# Patient Record
Sex: Female | Born: 1965 | Race: White | Hispanic: No | Marital: Married | Smoking: Never smoker
Health system: Southern US, Community
[De-identification: ages and names within clinical notes are randomized; demographics above are authoritative.]

## PROBLEM LIST (undated history)

## (undated) DIAGNOSIS — I6529 Occlusion and stenosis of unspecified carotid artery: Secondary | ICD-10-CM

## (undated) DIAGNOSIS — M549 Dorsalgia, unspecified: Secondary | ICD-10-CM

## (undated) DIAGNOSIS — G8929 Other chronic pain: Secondary | ICD-10-CM

## (undated) DIAGNOSIS — N189 Chronic kidney disease, unspecified: Secondary | ICD-10-CM

## (undated) DIAGNOSIS — I773 Arterial fibromuscular dysplasia: Secondary | ICD-10-CM

## (undated) DIAGNOSIS — I639 Cerebral infarction, unspecified: Secondary | ICD-10-CM

## (undated) DIAGNOSIS — G4730 Sleep apnea, unspecified: Secondary | ICD-10-CM

## (undated) HISTORY — DX: Sleep apnea, unspecified: G47.30

## (undated) HISTORY — PX: NASAL SEPTUM SURGERY: SHX37

## (undated) HISTORY — DX: Chronic kidney disease, unspecified: N18.9

## (undated) HISTORY — DX: Cerebral infarction, unspecified: I63.9

## (undated) HISTORY — DX: Occlusion and stenosis of unspecified carotid artery: I65.29

## (undated) HISTORY — DX: Other chronic pain: G89.29

## (undated) HISTORY — DX: Arterial fibromuscular dysplasia: I77.3

## (undated) HISTORY — DX: Dorsalgia, unspecified: M54.9

---

## 1998-08-17 ENCOUNTER — Encounter: Payer: Self-pay | Admitting: Obstetrics and Gynecology

## 1998-08-17 ENCOUNTER — Ambulatory Visit (HOSPITAL_COMMUNITY): Admission: AD | Admit: 1998-08-17 | Discharge: 1998-08-17 | Payer: Self-pay | Admitting: Obstetrics and Gynecology

## 1998-11-18 ENCOUNTER — Ambulatory Visit (HOSPITAL_COMMUNITY): Admission: RE | Admit: 1998-11-18 | Discharge: 1998-11-18 | Payer: Self-pay | Admitting: Obstetrics and Gynecology

## 1998-11-18 ENCOUNTER — Encounter (INDEPENDENT_AMBULATORY_CARE_PROVIDER_SITE_OTHER): Payer: Self-pay

## 1999-09-18 ENCOUNTER — Other Ambulatory Visit: Admission: RE | Admit: 1999-09-18 | Discharge: 1999-09-18 | Payer: Self-pay | Admitting: Obstetrics and Gynecology

## 1999-11-03 ENCOUNTER — Encounter: Payer: Self-pay | Admitting: Obstetrics and Gynecology

## 1999-11-03 ENCOUNTER — Ambulatory Visit (HOSPITAL_COMMUNITY): Admission: RE | Admit: 1999-11-03 | Discharge: 1999-11-03 | Payer: Self-pay | Admitting: Obstetrics and Gynecology

## 2000-03-18 ENCOUNTER — Encounter (HOSPITAL_COMMUNITY): Admission: RE | Admit: 2000-03-18 | Discharge: 2000-03-26 | Payer: Self-pay | Admitting: *Deleted

## 2000-03-25 ENCOUNTER — Inpatient Hospital Stay (HOSPITAL_COMMUNITY): Admission: EM | Admit: 2000-03-25 | Discharge: 2000-03-27 | Payer: Self-pay | Admitting: Obstetrics and Gynecology

## 2001-05-26 ENCOUNTER — Other Ambulatory Visit: Admission: RE | Admit: 2001-05-26 | Discharge: 2001-05-26 | Payer: Self-pay | Admitting: Obstetrics and Gynecology

## 2001-10-24 ENCOUNTER — Encounter: Admission: RE | Admit: 2001-10-24 | Discharge: 2001-10-24 | Payer: Self-pay | Admitting: *Deleted

## 2001-10-24 ENCOUNTER — Encounter: Payer: Self-pay | Admitting: *Deleted

## 2001-11-08 ENCOUNTER — Encounter: Payer: Self-pay | Admitting: *Deleted

## 2001-11-08 ENCOUNTER — Encounter: Admission: RE | Admit: 2001-11-08 | Discharge: 2001-11-08 | Payer: Self-pay | Admitting: *Deleted

## 2002-05-30 ENCOUNTER — Other Ambulatory Visit: Admission: RE | Admit: 2002-05-30 | Discharge: 2002-05-30 | Payer: Self-pay | Admitting: Obstetrics and Gynecology

## 2004-09-18 ENCOUNTER — Other Ambulatory Visit: Admission: RE | Admit: 2004-09-18 | Discharge: 2004-09-18 | Payer: Self-pay | Admitting: Obstetrics and Gynecology

## 2007-08-09 ENCOUNTER — Ambulatory Visit (HOSPITAL_COMMUNITY): Admission: RE | Admit: 2007-08-09 | Discharge: 2007-08-09 | Payer: Self-pay | Admitting: Obstetrics and Gynecology

## 2010-08-22 NOTE — Discharge Summary (Signed)
[facility]  Patient:    Rachel Golden, Rachel Golden                          MRN: [id number] Adm. Date:  [id number] Disc. Date: [id number] Attending:  Michaele Offer                           Discharge Summary  DISCHARGE DIAGNOSES:          1. Term pregnancy at 39 weeks, delivered.                               2. Advanced maternal age.                               3. Status post normal spontaneous vaginal                                  delivery.  DISCHARGE MEDICATIONS:        Motrin 600 mg p.o. q.6h. p.r.n.  DISCHARGE FOLLOW-UP:          Patient is to follow up in six weeks for her routine postpartum examination.  HOSPITAL COURSE:              Patient is a 45 year old G16, P1-0-2-1 who was admitted at 46 weeks for induction given advanced maternal age and a favorable cervix.  Prenatal care had been complicated only by a borderline elevated maternal serum AFP and anemia.  Patient did have AMA, however, declined amniocentesis and had a normal ultrasound.  Patient had a history of two first trimester spontaneous miscarriages and was on baby aspirin and progestin suppositories in the first trimester.  PRENATAL LABORATORIES:        Blood type A+.  RPR nonreactive.  Rubella immune.  Hepatitis B surface antigen negative.  HIV negative.  GC negative. Chlamydia negative.  GBS negative.  PAST OBSTETRICAL HISTORY:     In 1998 patient had a vacuum and forceps assisted delivery at 41 weeks of a 7 pound 10 ounce infant.  In May 2000 she had an incomplete miscarriage with a D&C.  In August 2000 she had a balloted ovum with a D&C.  PAST SURGICAL HISTORY:        She had nasal reconstruction, D&C x 2.  PAST MEDICAL HISTORY:         None.  ALLERGIES:                    No known drug allergies.  SOCIAL HISTORY:               She is married and smokes no tobacco.  PHYSICAL EXAMINATION:  VITAL SIGNS:                  She is afebrile with stable vital signs  on admission.  Fetal heart rate was reactive.  ABDOMEN:                      Gravid, nontender.  EFW approximately 7 pounds.  PELVIC:                       Vagina:  Patient was 3 cm, 30%, -  1 station.                                She had assist of ruptured membranes with clear fluid obtained and was begun on Pitocin augmentation.  Patient progressed quickly to complete and pushed well with a normal spontaneous vaginal delivery of a vigorous female infant.  Apgars were 8 and 9.  Weight was 8 pounds even. She had a second degree midline episiotomy.  Placenta delivered spontaneous and intact.  A small laceration on the cervix was also repaired for hemostasis with 3-0 Vicryl.  She did well postpartum and on postpartum day #2 was afebrile with stable vital signs, normal lochia, and her pain was well controlled.  Therefore, she was discharged to home to follow up in the office as previously stated. DD:  03/27/00 TD:  03/28/00 Job: [id number]

## 2012-05-17 ENCOUNTER — Ambulatory Visit (INDEPENDENT_AMBULATORY_CARE_PROVIDER_SITE_OTHER): Payer: Managed Care, Other (non HMO) | Admitting: Family Medicine

## 2012-05-17 VITALS — BP 108/60 | Ht 60.0 in | Wt 115.0 lb

## 2012-05-17 DIAGNOSIS — M999 Biomechanical lesion, unspecified: Secondary | ICD-10-CM

## 2012-05-17 DIAGNOSIS — M2170 Unequal limb length (acquired), unspecified site: Secondary | ICD-10-CM

## 2012-05-17 DIAGNOSIS — M545 Low back pain, unspecified: Secondary | ICD-10-CM

## 2012-05-17 NOTE — Progress Notes (Signed)
Chief complaint: Low back pain  History of present illness: Patient is a 47 year old female who is coming in with a complaint of low back pain. 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 Patient though has recently started running and has been trying to do more exercising and is noticing increasing pain. Patient states that this is on the lumbar side mostly right-sided, denies any radiation in the legs, describes the pain more as a dull aching sensation with some time sharp pain. Patient states that this pain is not waking her up at night but does interfere with some of her activities such as working out. Patient denies any weakness in her legs or any numbness. Patient was told about osteopathic manipulation in with like to see if she could be evaluated for this.  Past medical surgical family and social history reviewed without any significant pathology related to the history of present illness.  Denies fever, chills, nausea vomiting abdominal pain, dysuria, chest pain, shortness of breath dyspnea on exertion or numbness in extremities  Physical exam Blood pressure 108/60, height 5' (1.524 m), weight 115 lb (52.164 kg). General: No apparent distress alert and oriented x3 mood and affect normal. Respiratory: Patient's speak in full sentences and does not appear short of breath Skin: Warm dry intact with no signs of infection or rash Neuro: Cranial nerves II through XII are intact, neurovascularly intact in all extremities with 2+ DTRs and 2+ pulses. Back exam: On inspection patient has some minimal scoliosis to the right the lumbar spine. Patient is nontender on exam except for mild over the right SI joint. Patient has a negative straight leg and negative Faber test bilaterally. She is neurovascularly intact distally with 5 out of 5  strength and deep tendon reflexes are symmetric. Patient for range of motion is full. Patient though does have a significant leg length discrepancy with the right leg being shorter than the left leg by approximately half an inch.  Osteopathic manipulation findings Cervical C3 flexed rotated and side bent right Thoracic T5 extended rotated and side bent left Lumbar: L2 flexed rotated and side bent right L5 flexed rotated and side bent left Sacrum Left on left pain over the right SI joint.

## 2012-05-17 NOTE — Assessment & Plan Note (Signed)
Patient was given a 3/16 he'll lift into the right shoe. Patient will wear this and for the next 2 weeks and then increase size to 5/16. Patient was given an extra pair to put in her tennis shoes. Patient will followup in 3-6 weeks for further evaluation.

## 2012-05-17 NOTE — Assessment & Plan Note (Signed)
Decision today to treat with OMT was based on Physical Exam  After verbal consent patient was treated with ME, Articular techniques in lumbar sacral areas  Patient tolerated the procedure well with improvement in symptoms  Patient given exercises, stretches and lifestyle modifications  See medications in patient instructions if given  Patient will follow up in 3-6 weeks

## 2012-05-17 NOTE — Assessment & Plan Note (Signed)
Does have lumbar pain. Patient told about anti-inflammatories and given home exercise program to strengthen core as well as increase range of motion of the hip and back. Patient did have osteopathic manipulation which did have significant improvement. Leg length discrepancy also likely causing pain. Patient will followup again in 3-6 weeks for further evaluation and manipulation.

## 2012-06-07 ENCOUNTER — Ambulatory Visit: Payer: Managed Care, Other (non HMO) | Admitting: Family Medicine

## 2012-06-28 ENCOUNTER — Ambulatory Visit (INDEPENDENT_AMBULATORY_CARE_PROVIDER_SITE_OTHER): Payer: Managed Care, Other (non HMO) | Admitting: Family Medicine

## 2012-06-28 VITALS — BP 106/60 | Ht 60.0 in | Wt 115.0 lb

## 2012-06-28 DIAGNOSIS — M999 Biomechanical lesion, unspecified: Secondary | ICD-10-CM

## 2012-06-28 NOTE — Assessment & Plan Note (Signed)
Decision today to treat with OMT was based on Physical Exam  After verbal consent patient was treated with HVLA, ME techniques in cervical, thoracic lumbar and sacral areas  Patient tolerated the procedure well with improvement in symptoms  Patient given exercises, stretches and lifestyle modifications  See medications in patient instructions if given  Patient will follow up in 8-10 weeks      

## 2012-06-28 NOTE — Progress Notes (Signed)
Chief complaint followup of low back pain and leg length discrepancy  History of present illness: Patient is a 47 year old female who was seen 6 weeks ago for low back pain. Patient did have osteopathic manipulation and was found to have a leg length discrepancy. Patient continues to wear a lift on the right shoe daily which does seem to significantly help. Patient has also been working on core strengthening and she's been doing well. Patient has started playing tennis again and does have some aches and pains but overall she states she is approximately 75% better. Patient denies any radiation down her legs, denies any numbness or weakness. Patient is here for manipulation therapy again.  Osteopathic manipulation findings  Cervical  C3 flexed rotated and side bent right  Thoracic  T5 extended rotated and side bent left  Lumbar:  L2 flexed rotated and side bent right  L5 flexed rotated and side bent left  Sacrum  Left on left pain over the right SI joint.

## 2012-08-30 ENCOUNTER — Ambulatory Visit: Payer: Managed Care, Other (non HMO) | Admitting: Family Medicine

## 2012-12-13 ENCOUNTER — Ambulatory Visit (INDEPENDENT_AMBULATORY_CARE_PROVIDER_SITE_OTHER): Payer: Managed Care, Other (non HMO) | Admitting: Family Medicine

## 2012-12-13 VITALS — BP 102/80 | HR 68 | Resp 12 | Ht 60.0 in | Wt 114.0 lb

## 2012-12-13 DIAGNOSIS — M545 Low back pain: Secondary | ICD-10-CM

## 2012-12-13 DIAGNOSIS — M2170 Unequal limb length (acquired), unspecified site: Secondary | ICD-10-CM

## 2012-12-13 DIAGNOSIS — M999 Biomechanical lesion, unspecified: Secondary | ICD-10-CM

## 2012-12-13 NOTE — Assessment & Plan Note (Signed)
Decision today to treat with OMT was based on Physical Exam  After verbal consent patient was treated with HVLA, ME techniques in cervical, thoracic lumbar and sacral areas  Patient tolerated the procedure well with improvement in symptoms  Patient given exercises, stretches and lifestyle modifications  See medications in patient instructions if given  Patient will follow up in 6 weeks    

## 2012-12-13 NOTE — Assessment & Plan Note (Signed)
Discussed the patient does need to wear heel lift in all shoes.

## 2012-12-13 NOTE — Progress Notes (Signed)
  Subjective:    CC: Low back pain  HPI: Patient is a very pleasant 47 year old established patient of mine coming in for manipulation therapy. Patient has not been seen for 6 months. Patient had been doing very well and has been doing her exercises on a regular basis but recently she has had an exacerbation. Patient recently moved to a new house and has been doing a lot of manual labor moving boxes. Patient states that she has had more worsening left low back pain. Patient denies any radiation down the legs, denies any numbness or tingling in the toes and denies any weakness. Patient can use to work out on a fairly regular basis and has recently done her own sprint triathlon. Patient also has not been wearing her heel lift on her regular basis. She knows that this is likely exacerbated the problem as well.   Past medical history, Surgical history, Family history not pertinant except as noted below, Social history, Allergies, and medications have been entered into the medical record, reviewed, and no changes needed.   Review of Systems: No fevers, chills, night sweats, weight loss, chest pain, or shortness of breath. Patient also denies any abdominal pain, any diarrhea or constipation, any nausea or vomiting.  Objective:   Blood pressure 102/80, pulse 68, resp. rate 12, height 5' (1.524 m), weight 114 lb (51.71 kg), SpO2 99.00%.  General: Well Developed, well nourished, and in no acute distress.  Neuro: Alert and oriented x3, extra-ocular muscles intact, sensation grossly intact.  HEENT: Normocephalic, atraumatic, pupils equal round reactive to light, neck supple, no masses, no lymphadenopathy, thyroid nonpalpable.  Skin: Warm and dry, no rashes. Cardiac:  no lower extremity edema. Respiratory: Not using accessory muscles, speaking in full sentences. Abdominal: NT, soft Gait: Nonantlagic, good balance and coordination, patient does have significant shortening of the right leg compared to the  contralateral side. Lymphatic: no lymphadenopathy in neck or axillae on palpation, non tender.  Musculoskeletal: Inspection and palpation of the right and left upper extremities including the shoulders elbows and wrist are unremarkable with full range of motion and good muscle strength and tone. Inspection and palpation of the right and left lower extremities including the hips knees and ankles are unremarkable and nontender with full range of motion and good muscle strength and tone and are symmetric.  Osteopathic manipulation findings  Cervical  Neutral  Thoracic  T5 extended rotated and side bent left  Lumbar:  L2 flexed rotated and side bent right  L5 flexed rotated and side bent left  Sacrum  Left on left pain over the right SI joint  Impression and Recommendations:

## 2012-12-13 NOTE — Patient Instructions (Signed)
Very nice to see you Can't wait to meet your daughter Let add in the hip flexor after running or exercise.  Lets have you come back in 6 weeks.

## 2012-12-13 NOTE — Assessment & Plan Note (Signed)
Low back pain secondary to patient's leg length discrepancy and likely overuse secondary to tight hip flexors. Patient given you home exercise program that could be beneficial and did respond very well to osteopathic manipulation. Discussed icing protocol Patient will come back again in 6 weeks for further evaluation.

## 2013-01-24 ENCOUNTER — Ambulatory Visit: Payer: Managed Care, Other (non HMO) | Admitting: Family Medicine

## 2013-08-30 ENCOUNTER — Ambulatory Visit: Payer: Managed Care, Other (non HMO) | Admitting: Family Medicine

## 2013-09-06 ENCOUNTER — Ambulatory Visit (INDEPENDENT_AMBULATORY_CARE_PROVIDER_SITE_OTHER): Payer: Managed Care, Other (non HMO) | Admitting: Family Medicine

## 2013-09-06 VITALS — BP 96/70 | HR 60 | Ht 60.0 in | Wt 109.0 lb

## 2013-09-06 DIAGNOSIS — M6240 Contracture of muscle, unspecified site: Secondary | ICD-10-CM

## 2013-09-06 DIAGNOSIS — M545 Low back pain, unspecified: Secondary | ICD-10-CM

## 2013-09-06 DIAGNOSIS — M999 Biomechanical lesion, unspecified: Secondary | ICD-10-CM

## 2013-09-06 DIAGNOSIS — M24559 Contracture, unspecified hip: Secondary | ICD-10-CM

## 2013-09-06 DIAGNOSIS — M9981 Other biomechanical lesions of cervical region: Secondary | ICD-10-CM

## 2013-09-06 MED ORDER — CYCLOBENZAPRINE HCL 10 MG PO TABS: 10.0000 mg | ORAL_TABLET | Freq: Three times a day (TID) | ORAL | Status: DC | PRN

## 2013-09-06 NOTE — Assessment & Plan Note (Signed)
Patient's hip flexors are severely tight at this time and may even have a strain. Patient was given a handout of home exercises and showed proper technique today. We discussed icing protocol and over-the-counter medications that could be beneficial. Patient was also given a prescription for muscle relaxant. Patient will come back again in 4 weeks for further evaluation and treatment.  Spent greater than 25 minutes with patient face-to-face and had greater than 50% of counseling including as described above in assessment and plan.

## 2013-09-06 NOTE — Assessment & Plan Note (Signed)
Decision today to treat with OMT was based on Physical Exam  After verbal consent patient was treated with HVLA, ME techniques in cervical, thoracic lumbar and sacral areas  Patient tolerated the procedure well with improvement in symptoms  Patient given exercises, stretches and lifestyle modifications  See medications in patient instructions if given  Patient will follow up in 4 weeks      

## 2013-09-06 NOTE — Progress Notes (Signed)
  Subjective:    CC: Low back pain followup  HPI: Patient is a very pleasant 48 year old established patient of mine coming in for manipulation therapy. Patient has not been seen for 9 months. Patient has been doing very well but has increased her running mileage. Patient did do a half marathon and then afterwards her having her low back pain. This is more of a dull aching sensation that is worse with activity. Denies any radiation down the legs any numbness or tingling or any weakness. Denies any nighttime awakening. Patient has also been unfortunately moving a lot of boxes with her father going to a assisted-living facility. Patient states this has exacerbated the problem to  Past medical history, Surgical history, Family history not pertinant except as noted below, Social history, Allergies, and medications have been entered into the medical record, reviewed, and no changes needed.   Review of Systems: No fevers, chills, night sweats, weight loss, chest pain, or shortness of breath. Patient also denies any abdominal pain, any diarrhea or constipation, any nausea or vomiting.  Objective:   Blood pressure 96/70, pulse 60, height 5' (1.524 m), weight 109 lb (49.442 kg), SpO2 98.00%.  General: Well Developed, well nourished, and in no acute distress.  Neuro: Alert and oriented x3, extra-ocular muscles intact, sensation grossly intact.  HEENT: Normocephalic, atraumatic, pupils equal round reactive to light, neck supple, no masses, no lymphadenopathy, thyroid nonpalpable.  Skin: Warm and dry, no rashes. Cardiac:  no lower extremity edema. Respiratory: Not using accessory muscles, speaking in full sentences. Abdominal: NT, soft Gait: Nonantlagic, good balance and coordination, patient does have significant shortening of the right leg compared to the contralateral side. Lymphatic: no lymphadenopathy in neck or axillae on palpation, non tender.  Musculoskeletal: Inspection and palpation of the right  and left upper extremities including the shoulders elbows and wrist are unremarkable with full range of motion and good muscle strength and tone. Inspection and palpation of the right and left lower extremities including the hips knees and ankles are unremarkable and nontender with full range of motion and good muscle strength and tone and are symmetric.  Osteopathic manipulation findings  Cervical  C2 flexed rotated and side bent right Thoracic  T5 extended rotated and side bent left  Lumbar:  L2 flexed rotated and side bent right  L5 flexed rotated and side bent left  Severely tight hip flexors bilaterally Sacrum  Left on left pain over the right SI joint  Impression and Recommendations:

## 2013-09-06 NOTE — Assessment & Plan Note (Signed)
Encourage patient to continue to wear her heel lift on the

## 2013-09-06 NOTE — Patient Instructions (Signed)
Good to see you! Flexeril at night and when you need it during the day.  Try stretches for hip flexor.  One knee down, one up tilt pelvis forward. Hold 10 seconds and repeat.  Look at handout.  Continue the turmeric See me again in 4 weeks if not perfect.

## 2013-10-17 ENCOUNTER — Other Ambulatory Visit: Payer: Self-pay | Admitting: Obstetrics and Gynecology

## 2013-10-17 DIAGNOSIS — R928 Other abnormal and inconclusive findings on diagnostic imaging of breast: Secondary | ICD-10-CM

## 2013-10-25 ENCOUNTER — Ambulatory Visit
Admission: RE | Admit: 2013-10-25 | Discharge: 2013-10-25 | Disposition: A | Discharge status: Home / Self Care | Payer: Managed Care, Other (non HMO) | Source: Ambulatory Visit | Attending: Obstetrics and Gynecology | Admitting: Obstetrics and Gynecology

## 2013-10-25 DIAGNOSIS — R928 Other abnormal and inconclusive findings on diagnostic imaging of breast: Secondary | ICD-10-CM

## 2014-05-01 ENCOUNTER — Encounter: Payer: Self-pay | Admitting: Family Medicine

## 2014-05-01 ENCOUNTER — Ambulatory Visit (INDEPENDENT_AMBULATORY_CARE_PROVIDER_SITE_OTHER): Payer: Managed Care, Other (non HMO) | Admitting: Family Medicine

## 2014-05-01 VITALS — BP 98/66 | HR 69 | Ht 60.0 in | Wt 119.0 lb

## 2014-05-01 DIAGNOSIS — M999 Biomechanical lesion, unspecified: Secondary | ICD-10-CM

## 2014-05-01 DIAGNOSIS — M9904 Segmental and somatic dysfunction of sacral region: Secondary | ICD-10-CM

## 2014-05-01 DIAGNOSIS — M545 Low back pain, unspecified: Secondary | ICD-10-CM

## 2014-05-01 DIAGNOSIS — M9903 Segmental and somatic dysfunction of lumbar region: Secondary | ICD-10-CM

## 2014-05-01 DIAGNOSIS — M9902 Segmental and somatic dysfunction of thoracic region: Secondary | ICD-10-CM

## 2014-05-01 NOTE — Patient Instructions (Signed)
Good to see you! Exercises 3 times a week.  Biking would be good.  OK to do arc or elliptical in 1 week.  Ice 20 minutes 2 times daily. Usually after activity and before bed. See me again in 4 weeks at that time if doing well will let you start running without restrictions.  Running only 2 times a week.

## 2014-05-01 NOTE — Assessment & Plan Note (Signed)
Patient's low back pain is multifactorial secondary to patient's hip flexor tightness as well as leg length discrepancy. Encourage patient to wear the heel lift on a regular basis. We also discussed icing regimen and patient was given topical anti-inflammatories on a 2 day burst of anti-inflammatories. We discussed what activities in the gym would be more beneficial. Patient will limit the amount of running she does to 2 times a week for the next 4 weeks and then come back for further evaluation. Patient did respond very well to osteopathic manipulation today.

## 2014-05-01 NOTE — Progress Notes (Signed)
  Subjective:    CC: Low back pain followup  HPI: Patient is a very pleasant 49 year old established patient of mine coming in for manipulation therapy. Patient has not been seen for 7 months. Patient has decreased the amount of running recently. States that for the last month is a some decrease inflammation. Denies any radiation of pain any numbness. Patient states though that it is starting to affect some of her activities. Patient has not been working out secondary to this pain. Denies any fevers or chills or any abnormal weight loss. Patient has had some mild weight gain recently. Patient was a severity of pain is 6 out of 10 and not significantly different than her other pain. Patient has been going to the gym fairly regularly 2-3 times a week. Denies any nighttime awakening. States that ice and Tylenol as helpful.  Past medical history, Surgical history, Family history not pertinant except as noted below, Social history, Allergies, and medications have been entered into the medical record, reviewed, and no changes needed.   Review of Systems: No fevers, chills, night sweats, weight loss, chest pain, or shortness of breath. Patient also denies any abdominal pain, any diarrhea or constipation, any nausea or vomiting.  Objective:   Blood pressure 98/66, pulse 69, height 5' (1.524 m), weight 119 lb (53.978 kg), SpO2 99 %.  General: Well Developed, well nourished, and in no acute distress.  Neuro: Alert and oriented x3, extra-ocular muscles intact, sensation grossly intact.  HEENT: Normocephalic, atraumatic, pupils equal round reactive to light, neck supple, no masses, no lymphadenopathy, thyroid nonpalpable.  Skin: Warm and dry, no rashes. Cardiac:  no lower extremity edema. Respiratory: Not using accessory muscles, speaking in full sentences. Abdominal: NT, soft Gait: Nonantlagic, good balance and coordination, patient does have significant shortening of the right leg compared to the  contralateral side. Lymphatic: no lymphadenopathy in neck or axillae on palpation, non tender.  Musculoskeletal: Inspection and palpation of the right and left upper extremities including the shoulders elbows and wrist are unremarkable with full range of motion and good muscle strength and tone. Inspection and palpation of the right and left lower extremities including the hips knees and ankles are unremarkable and nontender with full range of motion and good muscle strength and tone and are symmetric. Back Exam:  Inspection: Unremarkable  Motion: Flexion 45 deg, Extension 45 deg, Side Bending to 45 deg bilaterally,  Rotation to 45 deg bilaterally  SLR laying: Negative  XSLR laying: Negative  Palpable tenderness: Tenderness over the left SI joint FABER: negative. Sensory change: Gross sensation intact to all lumbar and sacral dermatomes.  Reflexes: 2+ at both patellar tendons, 2+ at achilles tendons, Babinski's downgoing.  Strength at foot  Plantar-flexion: 5/5 Dorsi-flexion: 5/5 Eversion: 5/5 Inversion: 5/5  Leg strength  Quad: 5/5 Hamstring: 5/5 Hip flexor: 5/5 Hip abductors: 5/5  Gait unremarkable.   Osteopathic manipulation findings  Cervical  C2 flexed rotated and side bent right Thoracic  T5 extended rotated and side bent left  Lumbar:  L2 flexed rotated and side bent right  L5 flexed rotated and side bent left  Severely tight hip flexors bilaterally Sacrum  Left on left pain over the LEFT SI joint  Impression and Recommendations:

## 2014-05-01 NOTE — Assessment & Plan Note (Signed)
Decision today to treat with OMT was based on Physical Exam  After verbal consent patient was treated with HVLA, ME techniques in cervical, thoracic lumbar and sacral areas  Patient tolerated the procedure well with improvement in symptoms  Patient given exercises, stretches and lifestyle modifications  See medications in patient instructions if given  Patient will follow up in 4 weeks      

## 2014-05-29 ENCOUNTER — Encounter: Payer: Self-pay | Admitting: Family Medicine

## 2014-05-29 ENCOUNTER — Ambulatory Visit (INDEPENDENT_AMBULATORY_CARE_PROVIDER_SITE_OTHER): Payer: Managed Care, Other (non HMO) | Admitting: Family Medicine

## 2014-05-29 VITALS — BP 108/72 | HR 70 | Wt 117.0 lb

## 2014-05-29 DIAGNOSIS — M999 Biomechanical lesion, unspecified: Secondary | ICD-10-CM

## 2014-05-29 DIAGNOSIS — M9904 Segmental and somatic dysfunction of sacral region: Secondary | ICD-10-CM

## 2014-05-29 DIAGNOSIS — M9902 Segmental and somatic dysfunction of thoracic region: Secondary | ICD-10-CM

## 2014-05-29 DIAGNOSIS — M545 Low back pain, unspecified: Secondary | ICD-10-CM

## 2014-05-29 DIAGNOSIS — M9903 Segmental and somatic dysfunction of lumbar region: Secondary | ICD-10-CM

## 2014-05-29 MED ORDER — IBUPROFEN-FAMOTIDINE 800-26.6 MG PO TABS: 1.0000 | ORAL_TABLET | Freq: Three times a day (TID) | ORAL | Status: DC

## 2014-05-29 NOTE — Assessment & Plan Note (Signed)
Decision today to treat with OMT was based on Physical Exam  After verbal consent patient was treated with HVLA, ME techniques in cervical, thoracic lumbar and sacral areas  Patient tolerated the procedure well with improvement in symptoms  Patient given exercises, stretches and lifestyle modifications  See medications in patient instructions if given  Patient will follow up in 4-6 weeks      

## 2014-05-29 NOTE — Progress Notes (Signed)
  Subjective:    CC: Low back pain followup  HPI: Patient is a very pleasant 49 year old established patient of mine coming in for manipulation therapy. Patient states that after last visit she is doing approximately 50% better. Patient has been doing more the recumbent bike and is feeling better. Patient states that the medication that I gave her is also been very helpful. Patient has been doing the icing protocol and did get some the vitamins. Patient still has some mild dull aching pain but is doing somewhat better. Denies any new symptoms.  Past medical history, Surgical history, Family history not pertinant except as noted below, Social history, Allergies, and medications have been entered into the medical record, reviewed, and no changes needed.   Review of Systems: No fevers, chills, night sweats, weight loss, chest pain, or shortness of breath. Patient also denies any abdominal pain, any diarrhea or constipation, any nausea or vomiting.  Objective:   Blood pressure 108/72, pulse 70, weight 117 lb (53.071 kg), SpO2 96 %.  General: Well Developed, well nourished, and in no acute distress.  Neuro: Alert and oriented x3, extra-ocular muscles intact, sensation grossly intact.  HEENT: Normocephalic, atraumatic, pupils equal round reactive to light, neck supple, no masses, no lymphadenopathy, thyroid nonpalpable.  Skin: Warm and dry, no rashes. Cardiac:  no lower extremity edema. Respiratory: Not using accessory muscles, speaking in full sentences. Abdominal: NT, soft Gait: Nonantlagic, good balance and coordination, patient does have significant shortening of the right leg compared to the contralateral side. Lymphatic: no lymphadenopathy in neck or axillae on palpation, non tender.  Musculoskeletal: Inspection and palpation of the right and left upper extremities including the shoulders elbows and wrist are unremarkable with full range of motion and good muscle strength and tone. Inspection  and palpation of the right and left lower extremities including the hips knees and ankles are unremarkable and nontender with full range of motion and good muscle strength and tone and are symmetric. Back Exam:  Inspection: Unremarkable  Motion: Flexion 45 deg, Extension 45 deg, Side Bending to 45 deg bilaterally,  Rotation to 45 deg bilaterally  SLR laying: Negative  XSLR laying: Negative  Palpable tenderness: Tenderness over the left SI joint still present but less FABER: negative. Sensory change: Gross sensation intact to all lumbar and sacral dermatomes.  Reflexes: 2+ at both patellar tendons, 2+ at achilles tendons, Babinski's downgoing.  Strength at foot  Plantar-flexion: 5/5 Dorsi-flexion: 5/5 Eversion: 5/5 Inversion: 5/5  Leg strength  Quad: 5/5 Hamstring: 5/5 Hip flexor: 5/5 Hip abductors: 5/5  Gait unremarkable.   Osteopathic manipulation findings  Cervical  C2 flexed rotated and side bent right Thoracic  T5 extended rotated and side bent left   Lumbar:  L2 flexed rotated and side bent right  L5 flexed rotated and side bent left   Sacrum  Left on left   Impression and Recommendations:

## 2014-05-29 NOTE — Patient Instructions (Addendum)
Good to see you [place]  Continue what you are doing you are doing well.  Ice when acting up See me again in 5 weeks.

## 2014-05-29 NOTE — Assessment & Plan Note (Signed)
Patient back pain seems to be mostly multifactorial. Patient is doing somewhat better at this time. Encourage patient to continue the exercises and icing protocol. Patient was given more strengthening core exercises. Patient and will come back and see me again in 4-6 weeks for further evaluation and treatment.

## 2014-05-29 NOTE — Progress Notes (Signed)
Pre visit review using our clinic review tool, if applicable. No additional management support is needed unless otherwise documented below in the visit note. 

## 2014-05-30 ENCOUNTER — Telehealth: Payer: Self-pay | Admitting: Family Medicine

## 2014-05-30 MED ORDER — DICLOFENAC 18 MG PO CAPS: 1.0000 | ORAL_CAPSULE | Freq: Three times a day (TID) | ORAL | Status: DC | PRN

## 2014-05-30 NOTE — Telephone Encounter (Signed)
States Dr. Katrinka BlazingSmith prescribed her the incorrect med yesterday.  States it should have been zorvolex.

## 2014-05-30 NOTE — Telephone Encounter (Signed)
Rx sent into pharmacy. Pt made aware

## 2014-06-27 ENCOUNTER — Ambulatory Visit: Payer: Managed Care, Other (non HMO) | Admitting: Family Medicine

## 2014-06-28 ENCOUNTER — Encounter: Payer: Self-pay | Admitting: Family Medicine

## 2014-06-28 ENCOUNTER — Ambulatory Visit (INDEPENDENT_AMBULATORY_CARE_PROVIDER_SITE_OTHER): Payer: Managed Care, Other (non HMO) | Admitting: Family Medicine

## 2014-06-28 VITALS — BP 96/64 | HR 60 | Ht 60.0 in | Wt 117.0 lb

## 2014-06-28 DIAGNOSIS — M9904 Segmental and somatic dysfunction of sacral region: Secondary | ICD-10-CM

## 2014-06-28 DIAGNOSIS — M545 Low back pain, unspecified: Secondary | ICD-10-CM

## 2014-06-28 DIAGNOSIS — M999 Biomechanical lesion, unspecified: Secondary | ICD-10-CM

## 2014-06-28 DIAGNOSIS — M9903 Segmental and somatic dysfunction of lumbar region: Secondary | ICD-10-CM | POA: Diagnosis not present

## 2014-06-28 DIAGNOSIS — M9902 Segmental and somatic dysfunction of thoracic region: Secondary | ICD-10-CM | POA: Diagnosis not present

## 2014-06-28 NOTE — Patient Instructions (Signed)
You are doing great Congrats about your daughter Continue what you are doing.  See me again in 6 weeks.

## 2014-06-28 NOTE — Progress Notes (Signed)
  Subjective:    CC: Low back pain followup  HPI: Patient is a very pleasant 49 year old established patient of mine coming in for manipulation therapy. Patient continues to improve fairly slow. As long as patient wears her heel if she seems to do better she states. Patient has been traveling recently and has noticed some decrease in pain. Patient states his long as she avoids excessive amount of running she does better as well. Patient has been biking much more frequently..  Past medical history, Surgical history, Family history not pertinant except as noted below, Social history, Allergies, and medications have been entered into the medical record, reviewed, and no changes needed.   Review of Systems: No fevers, chills, night sweats, weight loss, chest pain, or shortness of breath. Patient also denies any abdominal pain, any diarrhea or constipation, any nausea or vomiting.  Objective:   Blood pressure 96/64, pulse 60, height 5' (1.524 m), weight 117 lb (53.071 kg), SpO2 97 %.  General: Well Developed, well nourished, and in no acute distress.  Neuro: Alert and oriented x3, extra-ocular muscles intact, sensation grossly intact.  HEENT: Normocephalic, atraumatic, pupils equal round reactive to light, neck supple, no masses, no lymphadenopathy, thyroid nonpalpable.  Skin: Warm and dry, no rashes. Cardiac:  no lower extremity edema. Respiratory: Not using accessory muscles, speaking in full sentences. Abdominal: NT, soft Gait: Nonantlagic, good balance and coordination, patient does have significant shortening of the right leg compared to the contralateral side. Lymphatic: no lymphadenopathy in neck or axillae on palpation, non tender.  Musculoskeletal: Inspection and palpation of the right and left upper extremities including the shoulders elbows and wrist are unremarkable with full range of motion and good muscle strength and tone. Inspection and palpation of the right and left lower  extremities including the hips knees and ankles are unremarkable and nontender with full range of motion and good muscle strength and tone and are symmetric. Back Exam:  Inspection: Unremarkable  Motion: Flexion 45 deg, Extension 45 deg, Side Bending to 45 deg bilaterally,  Rotation to 45 deg bilaterally  SLR laying: Negative  XSLR laying: Negative  Palpable tenderness: minimal tenderness over the left sacroiliac joint and the right paraspinal musculature of the lumbar spine FABER: negative. Sensory change: Gross sensation intact to all lumbar and sacral dermatomes.  Reflexes: 2+ at both patellar tendons, 2+ at achilles tendons, Babinski's downgoing.  Strength at foot  Plantar-flexion: 5/5 Dorsi-flexion: 5/5 Eversion: 5/5 Inversion: 5/5  Leg strength  Quad: 5/5 Hamstring: 5/5 Hip flexor: 5/5 Hip abductors: 5/5  Gait unremarkable.   Osteopathic manipulation findings  Cervical  C2 flexed rotated and side bent right Thoracic  T5 extended rotated and side bent left   Lumbar:  L2 flexed rotated and side bent right  L5 flexed rotated and side bent left   Sacrum  Left on left   Impression and Recommendations:

## 2014-06-28 NOTE — Assessment & Plan Note (Signed)
Patient is doing significantly well. We discussed getting a better heel lifts that could be beneficial for the leg length discrepancy. We discussed other changes to make with her daily activities and patient is able to run 2-3 times a week without any significant discomfort. Patient's has a prescription for an anti-inflammatory if needed. Patient and will come back and see me again in 5-6 weeks for further evaluation and treatment.

## 2014-06-28 NOTE — Assessment & Plan Note (Signed)
Decision today to treat with OMT was based on Physical Exam  After verbal consent patient was treated with HVLA, ME techniques in cervical, thoracic lumbar and sacral areas  Patient tolerated the procedure well with improvement in symptoms  Patient given exercises, stretches and lifestyle modifications  See medications in patient instructions if given  Patient will follow up in 5-6 weeks

## 2014-06-28 NOTE — Progress Notes (Signed)
Pre visit review using our clinic review tool, if applicable. No additional management support is needed unless otherwise documented below in the visit note. 

## 2014-07-03 ENCOUNTER — Ambulatory Visit: Payer: Managed Care, Other (non HMO) | Admitting: Family Medicine

## 2014-08-09 ENCOUNTER — Encounter: Payer: Self-pay | Admitting: Family Medicine

## 2014-08-09 ENCOUNTER — Ambulatory Visit (INDEPENDENT_AMBULATORY_CARE_PROVIDER_SITE_OTHER): Payer: Managed Care, Other (non HMO) | Admitting: Family Medicine

## 2014-08-09 VITALS — BP 102/72 | HR 65 | Ht 60.0 in | Wt 115.0 lb

## 2014-08-09 DIAGNOSIS — M9903 Segmental and somatic dysfunction of lumbar region: Secondary | ICD-10-CM

## 2014-08-09 DIAGNOSIS — M9904 Segmental and somatic dysfunction of sacral region: Secondary | ICD-10-CM

## 2014-08-09 DIAGNOSIS — M999 Biomechanical lesion, unspecified: Secondary | ICD-10-CM

## 2014-08-09 DIAGNOSIS — M545 Low back pain, unspecified: Secondary | ICD-10-CM

## 2014-08-09 DIAGNOSIS — M9902 Segmental and somatic dysfunction of thoracic region: Secondary | ICD-10-CM

## 2014-08-09 NOTE — Patient Instructions (Addendum)
Great to see you as always.  Contnue what you are doing  Continue to focus still on hip abductors.  Stretch hip flexor.  Wear heel lift daily Continue the vitamins See me again in 3-4 weeks.

## 2014-08-09 NOTE — Progress Notes (Signed)
Pre visit review using our clinic review tool, if applicable. No additional management support is needed unless otherwise documented below in the visit note. 

## 2014-08-09 NOTE — Progress Notes (Signed)
  Subjective:    CC: Low back pain followup  HPI: Patient is a very pleasant 49 year old established patient of mine coming in for manipulation therapy. Patient continues to improve.  Patient though states that the lateral aspect of the left hip continues to give her some mild discomfort. Patient is doing the exercises fairly regularly but finds it difficult. Continues to heel lift. Patient is using the anti-inflammatory and the topical medicines fairly regularly as well. Patient states that this is not stopping her from any her activities but unfortunately can't limits the longevity of how she does certain activities.  Past medical history, Surgical history, Family history not pertinant except as noted below, Social history, Allergies, and medications have been entered into the medical record, reviewed, and no changes needed.   Review of Systems: No fevers, chills, night sweats, weight loss, chest pain, or shortness of breath. Patient also denies any abdominal pain, any diarrhea or constipation, any nausea or vomiting.  Objective:   Blood pressure 102/72, pulse 65, height 5' (1.524 m), weight 115 lb (52.164 kg), SpO2 96 %.  General: Well Developed, well nourished, and in no acute distress.  Neuro: Alert and oriented x3, extra-ocular muscles intact, sensation grossly intact.  HEENT: Normocephalic, atraumatic, pupils equal round reactive to light, neck supple, no masses, no lymphadenopathy, thyroid nonpalpable.  Skin: Warm and dry, no rashes. Cardiac:  no lower extremity edema. Respiratory: Not using accessory muscles, speaking in full sentences. Abdominal: NT, soft Gait: Nonantlagic, good balance and coordination, patient does have significant shortening of the right leg compared to the contralateral side. Lymphatic: no lymphadenopathy in neck or axillae on palpation, non tender.  Musculoskeletal: Inspection and palpation of the right and left upper extremities including the shoulders elbows  and wrist are unremarkable with full range of motion and good muscle strength and tone. Inspection and palpation of the right and left lower extremities including the hips knees and ankles are unremarkable and nontender with full range of motion and good muscle strength and tone and are symmetric. Back Exam:  Inspection: Unremarkable  Motion: Flexion 45 deg, Extension 45 deg, Side Bending to 45 deg bilaterally,  Rotation to 45 deg bilaterally  SLR laying: Negative  XSLR laying: Negative  Palpable tenderness: minimal tenderness over the left sacroiliac joint and the right paraspinal musculature of the lumbar spine FABER: negative. Sensory change: Gross sensation intact to all lumbar and sacral dermatomes.  Reflexes: 2+ at both patellar tendons, 2+ at achilles tendons, Babinski's downgoing.  Strength at foot  Plantar-flexion: 5/5 Dorsi-flexion: 5/5 Eversion: 5/5 Inversion: 5/5  Leg strength  Quad: 5/5 Hamstring: 5/5 Hip flexor: 5/5 Hip abductors: 4/5  Gait unremarkable.   Osteopathic manipulation findings  Cervical  C2 flexed rotated and side bent right Thoracic  T5 extended rotated and side bent left   Lumbar:  L2 flexed rotated and side bent right  L5 flexed rotated and side bent left   Sacrum  Left on left   Impression and Recommendations:

## 2014-08-09 NOTE — Assessment & Plan Note (Signed)
She has had a long workup for this previously and has had even epidurals. Patient is avoiding any significant epidural steroid injections at this time. Patient also is having pain that seems to be more localized over the left sacroiliac joint. Patient may need a injection if this continues. Patient does respond well to osteopathic manipulation and will increase frequency at this time. Patient will continue the icing and we discussed working on the hip abductor strengthening. Patient and will come back in 3 weeks for further evaluation and treatment.

## 2014-08-09 NOTE — Assessment & Plan Note (Signed)
Decision today to treat with OMT was based on Physical Exam  After verbal consent patient was treated with HVLA, ME techniques in cervical, thoracic lumbar and sacral areas  Patient tolerated the procedure well with improvement in symptoms  Patient given exercises, stretches and lifestyle modifications  See medications in patient instructions if given  Patient will follow up in 3 weeks

## 2014-08-14 ENCOUNTER — Ambulatory Visit (INDEPENDENT_AMBULATORY_CARE_PROVIDER_SITE_OTHER)
Admission: RE | Admit: 2014-08-14 | Discharge: 2014-08-14 | Disposition: A | Discharge status: Home / Self Care | Payer: Managed Care, Other (non HMO) | Source: Ambulatory Visit | Attending: Family Medicine | Admitting: Family Medicine

## 2014-08-14 ENCOUNTER — Ambulatory Visit (INDEPENDENT_AMBULATORY_CARE_PROVIDER_SITE_OTHER): Payer: Managed Care, Other (non HMO) | Admitting: Family Medicine

## 2014-08-14 ENCOUNTER — Encounter: Payer: Self-pay | Admitting: Family Medicine

## 2014-08-14 VITALS — BP 104/82 | HR 65 | Ht 60.0 in | Wt 115.0 lb

## 2014-08-14 DIAGNOSIS — M542 Cervicalgia: Secondary | ICD-10-CM | POA: Diagnosis not present

## 2014-08-14 DIAGNOSIS — M9904 Segmental and somatic dysfunction of sacral region: Secondary | ICD-10-CM | POA: Diagnosis not present

## 2014-08-14 DIAGNOSIS — M545 Low back pain, unspecified: Secondary | ICD-10-CM

## 2014-08-14 DIAGNOSIS — M9903 Segmental and somatic dysfunction of lumbar region: Secondary | ICD-10-CM | POA: Diagnosis not present

## 2014-08-14 DIAGNOSIS — M9902 Segmental and somatic dysfunction of thoracic region: Secondary | ICD-10-CM

## 2014-08-14 DIAGNOSIS — M549 Dorsalgia, unspecified: Secondary | ICD-10-CM

## 2014-08-14 DIAGNOSIS — M999 Biomechanical lesion, unspecified: Secondary | ICD-10-CM

## 2014-08-14 MED ORDER — KETOROLAC TROMETHAMINE 60 MG/2ML IM SOLN
60.0000 mg | Freq: Once | INTRAMUSCULAR | Status: AC
  Administered 2014-08-14: 60 mg via INTRAMUSCULAR

## 2014-08-14 MED ORDER — METHYLPREDNISOLONE ACETATE 80 MG/ML IJ SUSP
80.0000 mg | Freq: Once | INTRAMUSCULAR | Status: AC
  Administered 2014-08-14: 80 mg via INTRAMUSCULAR

## 2014-08-14 NOTE — Assessment & Plan Note (Signed)
Rachel Golden to have mild back pain. I do think that this is still secondary to her underlying osteophytic changes. I do believe that repeat x-rays are necessary now the patient has had another flare. Patient will continue with the oral anti-inflammatories and we'll do a short course of prednisone. We discussed icing until limit her activities over the course the next week and patient will come back and see me again in the next 7-14 days.

## 2014-08-14 NOTE — Assessment & Plan Note (Signed)
Decision today to treat with OMT was based on Physical Exam  After verbal consent patient was treated with HVLA, ME techniques in cervical, thoracic lumbar and sacral areas  Patient tolerated the procedure well with improvement in symptoms  Patient given exercises, stretches and lifestyle modifications  See medications in patient instructions if given  Patient will follow up in 7-14 days

## 2014-08-14 NOTE — Progress Notes (Signed)
Pre visit review using our clinic review tool, if applicable. No additional management support is needed unless otherwise documented below in the visit note. 

## 2014-08-14 NOTE — Progress Notes (Signed)
  Subjective:    CC: Low back pain followup  HPI: Patient is a very pleasant 49 year old established patient of mine coming in for manipulation therapy. Patient was doing significantly better but then had significant worsening in her back pain after 48 hours of seen me. Patient was picking something up at a store and felt somewhat lightheaded. Patient actually had a headache that was associated with this. Patient then was followed have neck pain and continues to have neck pain since that time. States that it makes it very difficult to rotate to the right. Patient denies any radiation down the arms or any numbness or tingling. Denies any visual changes. Patient has had fainting episodes in the past but this is when she is having her earwax cleaned out.  Past medical history, Surgical history, Family history not pertinant except as noted below, Social history, Allergies, and medications have been entered into the medical record, reviewed, and no changes needed.   Review of Systems: No fevers, chills, night sweats, weight loss, chest pain, or shortness of breath. Patient also denies any abdominal pain, any diarrhea or constipation, any nausea or vomiting.  Objective:   Blood pressure 104/82, pulse 65, height 5' (1.524 m), weight 115 lb (52.164 kg), SpO2 95 %.  General: Well Developed, well nourished, and in no acute distress.  Neuro: Alert and oriented x3, extra-ocular muscles intact, sensation grossly intact.  HEENT: Normocephalic, atraumatic, pupils equal round reactive to light, neck supple, no masses, no lymphadenopathy, thyroid nonpalpable.  Skin: Warm and dry, no rashes. Cardiac:  no lower extremity edema. No carotid bruit noted Respiratory: Not using accessory muscles, speaking in full sentences. Abdominal: NT, soft Gait: Nonantlagic, good balance and coordination, patient does have significant shortening of the right leg compared to the contralateral side. Lymphatic: no lymphadenopathy in  neck or axillae on palpation, non tender.  Musculoskeletal: Inspection and palpation of the right and left upper extremities including the shoulders elbows and wrist are unremarkable with full range of motion and good muscle strength and tone. Inspection and palpation of the right and left lower extremities including the hips knees and ankles are unremarkable and nontender with full range of motion and good muscle strength and tone and are symmetric. Neck: Inspection unremarkable. No palpable stepoffs. Negative Spurling's maneuver. Continued limitation with right-sided rotation and side bending Grip strength and sensation normal in bilateral hands Strength good C4 to T1 distribution No sensory change to C4 to T1 Negative Hoffman sign bilaterally Reflexes normal Back Exam:  Inspection: Unremarkable  Motion: Flexion 25 deg, Extension 25 deg, Side Bending to 25 deg bilaterally, much more stiffness than previous Rotation to 45 deg bilaterally  SLR laying: Negative  XSLR laying: Negative  Palpable tenderness: minimal tenderness over the left sacroiliac joint and the right paraspinal musculature of the lumbar spine FABER: negative. Sensory change: Gross sensation intact to all lumbar and sacral dermatomes.  Reflexes: 2+ at both patellar tendons, 2+ at achilles tendons, Babinski's downgoing.  Strength at foot  Plantar-flexion: 5/5 Dorsi-flexion: 5/5 Eversion: 5/5 Inversion: 5/5  Leg strength  Quad: 5/5 Hamstring: 5/5 Hip flexor: 5/5 Hip abductors: 4/5  Gait unremarkable.   Osteopathic manipulation findings  Cervical  C2 flexed rotated and side bent right Thoracic  T5 extended rotated and side bent left   Lumbar:  L2 flexed rotated and side bent right  L5 flexed rotated and side bent left   Sacrum  Left on left   Impression and Recommendations:

## 2014-08-14 NOTE — Patient Instructions (Signed)
Good to see you 2 injections today.  Keep doing what you are doing maybe take it easy until the weekend.  Prednisone daily for 5 days Xrays downstairs to make sure ok Sign up for my chart See me again in 2 weeks.

## 2014-08-14 NOTE — Assessment & Plan Note (Signed)
Patient's back pain is seems to be new. Patient has had some mild cervical osteopathic findings previously. The one concerning thing is the headache but this did resolve after hours. Am hoping that this is more secondary to nutrition. I do get an x-ray for further evaluation. I do not think further workup such as for temporal arteritis or carotid Dopplers is necessary. Patient has no bruit today. Patient will be given steroids to help with all of her back pain. Patient and will come back and see me again in 7-14 days for further evaluation and treatment.

## 2014-08-15 ENCOUNTER — Other Ambulatory Visit: Payer: Self-pay | Admitting: *Deleted

## 2014-08-15 MED ORDER — PREDNISONE 50 MG PO TABS: ORAL_TABLET | ORAL | Status: DC

## 2014-08-23 ENCOUNTER — Ambulatory Visit (INDEPENDENT_AMBULATORY_CARE_PROVIDER_SITE_OTHER): Payer: Managed Care, Other (non HMO) | Admitting: Family Medicine

## 2014-08-23 ENCOUNTER — Other Ambulatory Visit (INDEPENDENT_AMBULATORY_CARE_PROVIDER_SITE_OTHER): Payer: Managed Care, Other (non HMO)

## 2014-08-23 ENCOUNTER — Telehealth: Payer: Self-pay | Admitting: Family Medicine

## 2014-08-23 ENCOUNTER — Encounter: Payer: Self-pay | Admitting: Family Medicine

## 2014-08-23 VITALS — BP 110/82 | HR 70 | Ht 60.0 in | Wt 115.0 lb

## 2014-08-23 DIAGNOSIS — M542 Cervicalgia: Secondary | ICD-10-CM

## 2014-08-23 DIAGNOSIS — R51 Headache: Secondary | ICD-10-CM

## 2014-08-23 DIAGNOSIS — R42 Dizziness and giddiness: Secondary | ICD-10-CM

## 2014-08-23 DIAGNOSIS — H547 Unspecified visual loss: Secondary | ICD-10-CM

## 2014-08-23 DIAGNOSIS — R519 Headache, unspecified: Secondary | ICD-10-CM

## 2014-08-23 DIAGNOSIS — M25511 Pain in right shoulder: Secondary | ICD-10-CM | POA: Insufficient documentation

## 2014-08-23 LAB — CBC WITH DIFFERENTIAL/PLATELET
Basophils Absolute: 0.2 10*3/uL — ABNORMAL HIGH (ref 0.0–0.1)
Basophils Relative: 2.2 % (ref 0.0–3.0)
EOS PCT: 0.1 % (ref 0.0–5.0)
Eosinophils Absolute: 0 10*3/uL (ref 0.0–0.7)
HCT: 41.4 % (ref 36.0–46.0)
Hemoglobin: 14.1 g/dL (ref 12.0–15.0)
LYMPHS ABS: 0.5 10*3/uL — AB (ref 0.7–4.0)
Lymphocytes Relative: 6.2 % — ABNORMAL LOW (ref 12.0–46.0)
MCHC: 34 g/dL (ref 30.0–36.0)
MCV: 89.4 fl (ref 78.0–100.0)
MONO ABS: 0.1 10*3/uL (ref 0.1–1.0)
MONOS PCT: 0.8 % — AB (ref 3.0–12.0)
Neutro Abs: 6.7 10*3/uL (ref 1.4–7.7)
Platelets: 287 10*3/uL (ref 150.0–400.0)
RBC: 4.63 Mil/uL (ref 3.87–5.11)
RDW: 13.6 % (ref 11.5–15.5)
WBC: 7.4 10*3/uL (ref 4.0–10.5)

## 2014-08-23 LAB — SEDIMENTATION RATE: Sed Rate: 12 mm/hr (ref 0–22)

## 2014-08-23 LAB — TSH: TSH: 0.72 u[IU]/mL (ref 0.35–4.50)

## 2014-08-23 LAB — C-REACTIVE PROTEIN: CRP: 0.1 mg/dL — AB (ref 0.5–20.0)

## 2014-08-23 MED ORDER — KETOROLAC TROMETHAMINE 60 MG/2ML IM SOLN
60.0000 mg | Freq: Once | INTRAMUSCULAR | Status: AC
  Administered 2014-08-23: 60 mg via INTRAMUSCULAR

## 2014-08-23 MED ORDER — TRAMADOL HCL 50 MG PO TABS: 50.0000 mg | ORAL_TABLET | Freq: Two times a day (BID) | ORAL | Status: DC | PRN

## 2014-08-23 MED ORDER — METHYLPREDNISOLONE ACETATE 80 MG/ML IJ SUSP
80.0000 mg | Freq: Once | INTRAMUSCULAR | Status: AC
  Administered 2014-08-23: 80 mg via INTRAMUSCULAR

## 2014-08-23 NOTE — Patient Instructions (Addendum)
I am sorry you are hurting.  Home exercises start in 48 hours.  2 injections today We will get labs to see what is goin on and MRI/MRA Tramadol 50mg  up to 2 times a day for pain.  See me 1-2 days afterwards

## 2014-08-23 NOTE — Progress Notes (Signed)
Pre visit review using our clinic review tool, if applicable. No additional management support is needed unless otherwise documented below in the visit note. 

## 2014-08-23 NOTE — Telephone Encounter (Signed)
Pt was seen by dr smith today.  

## 2014-08-23 NOTE — Assessment & Plan Note (Addendum)
As above, discussed with patient again at great length. Patient will go to the emergency determine if any worsening symptoms.  Associated with visual disturbances. Patient and will follow-up with neurology as well as for further evaluation. Advance imaging ordered.

## 2014-08-23 NOTE — Assessment & Plan Note (Signed)
Patient and worsening neck pain again. This time it was associated with a headache. There is a fairly large differential and we will get labs. Patient will have ESR to rule out temporal arteritis. Patient was recently on steroids and will continue. In addition of this differential also includes cardiovascular and nuero compromise. I do feel that advance imaging is necessary at this time. MRI as well as MR arthrogram of the head and brain and neck is ordered today. Objective differential also includes the patient is having atypical migraines and I do think that a referral to neurology could be important. Patient denies any numbness, tingling or any loss of strength of any of her extremities. This makes stroke potentially less likely. Patient has instructed to start aspirin daily. Depending on workup we will discuss further follow-up. 

## 2014-08-23 NOTE — Progress Notes (Signed)
  Subjective:    CC: Acute neck pain  HPI: Patient is a very pleasant 49 year old established patient of mine coming in for manipulation therapy. Patient is having an acute exacerbation of her neck pain. Patient at last visit did have a cervical neck x-rays that were fairly unremarkable with only mild osteophytic changes and mild degenerative disc disease. Patient states patient did have another episode. Patient was seen previously and had some headaches that seemed to be after an episode while she was shopping. Patient states today at 12:30 PM patient had a very similar presentation where she had an intense pain on the low right side of her head and neck. Patient states after that she did have a headache as well as very mild visual changes she states. Patient states that this only lasted proximally seconds and then seemed to go away but the headache lasted for one hour. Headache has resolved but continues to have the neck pain as well as some right shoulder pain. Fairly severe in nature. This is the second episode over the course of the last 2 weeks. Denies any fever, chills, any abnormal weight loss. Family history of potential rheumatoid arthritis noted.  Past medical history, Surgical history, Family history not pertinant except as noted below, Social history, Allergies, and medications have been entered into the medical record, reviewed, and no changes needed.   Review of Systems: No fevers, chills, night sweats, weight loss, chest pain, or shortness of breath. Patient also denies any abdominal pain, any diarrhea or constipation, any nausea or vomiting.  Objective:   Blood pressure 110/82, pulse 70, height 5' (1.524 m), weight 115 lb (52.164 kg), last menstrual period 06/03/2014, SpO2 97 %.  General: Well Developed, well nourished, and in no acute distress.  Neuro: Alert and oriented x3, extra-ocular muscles intact, sensation grossly intact.  HEENT: Normocephalic, atraumatic, pupils equal round  reactive to light, neck supple, no masses, no lymphadenopathy, thyroid nonpalpable.  Skin: Warm and dry, no rashes. Cardiac:  no lower extremity edema. No carotid bruit noted, regular rate and rhythm no murmur Respiratory: Not using accessory muscles, speaking in full sentences. Abdominal: NT, soft Gait: Nonantlagic, good balance and coordination, patient does have significant shortening of the right leg compared to the contralateral side. Lymphatic: no lymphadenopathy in neck or axillae on palpation, non tender.  Musculoskeletal: Inspection and palpation of the right and left upper extremities including the shoulders elbows and wrist are unremarkable with full range of motion and good muscle strength and tone. Inspection and palpation of the right and left lower extremities including the hips knees and ankles are unremarkable and nontender with full range of motion and good muscle strength and tone and are symmetric. Neck: Inspection unremarkable. No palpable stepoffs. Negative Spurling's maneuver. Continued limitation with right-sided rotation and side bending significant trigger points noted in the right trapezius muscle. Grip strength and sensation normal in bilateral hands Strength good C4 to T1 distribution No sensory change to C4 to T1 Negative Hoffman sign bilaterally Reflexes normal  Procedure note After verbal consent patient was prepped with alcohol swabs and 3 trigger points into the right trapezius with a 25-gauge 1 inch needle was injected with a total of 1 mL of 0.5% Marcaine and 1 mL of Kenalog 40 mg/dL. Minimal blood loss. Pain was improved. Post injection instructions given. Impression and Recommendations:

## 2014-08-23 NOTE — Telephone Encounter (Signed)
Patient is having neck issues and is requesting to see if she can be worked in this afternoon.  She has a friend who would be able to drive her here today.

## 2014-08-24 LAB — ANA: Anti Nuclear Antibody(ANA): NEGATIVE

## 2014-08-24 LAB — PROLACTIN: PROLACTIN: 7.5 ng/mL

## 2014-08-25 ENCOUNTER — Other Ambulatory Visit: Payer: Self-pay | Admitting: Family Medicine

## 2014-08-25 DIAGNOSIS — R42 Dizziness and giddiness: Secondary | ICD-10-CM

## 2014-08-25 DIAGNOSIS — H547 Unspecified visual loss: Secondary | ICD-10-CM

## 2014-08-27 ENCOUNTER — Ambulatory Visit
Admission: RE | Admit: 2014-08-27 | Discharge: 2014-08-27 | Disposition: A | Discharge status: Home / Self Care | Payer: Managed Care, Other (non HMO) | Source: Ambulatory Visit | Attending: Family Medicine | Admitting: Family Medicine

## 2014-08-27 ENCOUNTER — Telehealth: Payer: Self-pay | Admitting: Family Medicine

## 2014-08-27 DIAGNOSIS — H547 Unspecified visual loss: Secondary | ICD-10-CM

## 2014-08-27 DIAGNOSIS — R42 Dizziness and giddiness: Secondary | ICD-10-CM

## 2014-08-27 LAB — CYCLIC CITRUL PEPTIDE ANTIBODY, IGG

## 2014-08-27 MED ORDER — GADOBENATE DIMEGLUMINE 529 MG/ML IV SOLN
10.0000 mL | Freq: Once | INTRAVENOUS | Status: AC | PRN
Start: 2014-08-27 — End: 2014-08-27
  Administered 2014-08-27: 10 mL via INTRAVENOUS

## 2014-08-27 MED ORDER — ATORVASTATIN CALCIUM 20 MG PO TABS: 20.0000 mg | ORAL_TABLET | Freq: Every day | ORAL | Status: DC

## 2014-08-27 NOTE — Telephone Encounter (Signed)
Aspirin 162 mg daily. Lipitor  20mg  daily  Called patient and gave her the results of the MR arthrogram of her neck. The vertebral arteries I do see some fibromuscular dysplasia that I'm concerned about with patient's symptomatology. We will try to make patient follow up with neurology in the next day or 2. Patient knows if any worsening symptoms ago to the emergency room. Patient given the suggestions of the medications above. Prescription sent in. Patient will be sent for a specialist for further evaluation.

## 2014-08-28 ENCOUNTER — Ambulatory Visit: Payer: Managed Care, Other (non HMO) | Admitting: Family Medicine

## 2014-08-29 ENCOUNTER — Ambulatory Visit (INDEPENDENT_AMBULATORY_CARE_PROVIDER_SITE_OTHER): Payer: Managed Care, Other (non HMO) | Admitting: Neurology

## 2014-08-29 ENCOUNTER — Encounter: Payer: Self-pay | Admitting: Neurology

## 2014-08-29 VITALS — BP 110/70 | HR 78 | Ht 60.0 in | Wt 113.1 lb

## 2014-08-29 DIAGNOSIS — I639 Cerebral infarction, unspecified: Secondary | ICD-10-CM | POA: Insufficient documentation

## 2014-08-29 DIAGNOSIS — I773 Arterial fibromuscular dysplasia: Secondary | ICD-10-CM

## 2014-08-29 DIAGNOSIS — I6350 Cerebral infarction due to unspecified occlusion or stenosis of unspecified cerebral artery: Secondary | ICD-10-CM | POA: Diagnosis not present

## 2014-08-29 LAB — COMPREHENSIVE METABOLIC PANEL
ALT: 12 U/L (ref 0–35)
AST: 15 U/L (ref 0–37)
Albumin: 4.2 g/dL (ref 3.5–5.2)
Alkaline Phosphatase: 47 U/L (ref 39–117)
BUN: 17 mg/dL (ref 6–23)
CALCIUM: 9.5 mg/dL (ref 8.4–10.5)
CO2: 27 mEq/L (ref 19–32)
Chloride: 98 mEq/L (ref 96–112)
Creat: 0.64 mg/dL (ref 0.50–1.10)
Glucose, Bld: 82 mg/dL (ref 70–99)
Potassium: 4.9 mEq/L (ref 3.5–5.3)
Sodium: 137 mEq/L (ref 135–145)
TOTAL PROTEIN: 7.1 g/dL (ref 6.0–8.3)
Total Bilirubin: 1.3 mg/dL — ABNORMAL HIGH (ref 0.2–1.2)

## 2014-08-29 LAB — LIPID PANEL
Cholesterol: 158 mg/dL (ref 0–200)
HDL: 76 mg/dL (ref >=46)
LDL CALC: 55 mg/dL (ref 0–99)
Total CHOL/HDL Ratio: 2.1 Ratio
Triglycerides: 133 mg/dL (ref <150)
VLDL: 27 mg/dL (ref 0–40)

## 2014-08-29 LAB — HEMOGLOBIN A1C
Hgb A1c MFr Bld: 5.5 % (ref <5.7)
Mean Plasma Glucose: 111 mg/dL (ref <117)

## 2014-08-29 NOTE — Patient Instructions (Signed)
1.  Continue aspirin 325mg   2.  Check blood 3.  Echocardiogram 4.  Referral to neurosurgery for cerebral angiogram to evaluate for fibromuscular dysplasia 5.  Return to clinic in 1 month

## 2014-08-29 NOTE — Progress Notes (Signed)
[facility] Neurology [facility] Clinic Note - Initial Visit   Date: 08/29/2014   [name] MRN: [id number] DOB: [birth year]   Dear Dr. Katrinka Blazing:  Thank you for your kind referral of Rachel Golden for consultation of acute stroke. Although her history is well known to you, please allow [place] to reiterate it for the purpose of our medical record. The patient was accompanied to the clinic by husband who also provides collateral information.     History of Present Illness: Rachel Golden is a 49 y.o. 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    On May 13th, she was picking up a hanger off the floor and developed acute onset of dizziness.  She initially thought it was because she got up too quickly.  She then heard a "swooshing" sound in her head and developed a piercing headache.  She took advil which alleviated her headache. Swooshing sound resolved within 5 minutes.  Within 30-minutes, she developed right hand tingling and numbness of the lips.  She called 911 who evaluated her and did not feel that her symptoms were consistent with a stroke, so was not taken to the emergency department.  She also recalls having neck pain especially with neck rotation to the right.   On May 19th, she was getting out of the car and developed neck stiffness and heard a swooshing sound in her right ear with associated brief but severe headache.  She also has associated vision disturbance described as "kelidoscope" vision, dizzy, and nauseous. She had difficulty walking out of her car to her home, but was able to make it into her home slowly.  Symptoms lasted about 10-minutes.   She again went to see Dr. Katrinka Blazing on the same day who ordered MRI/A of her head.  Imaging shows small infarcts involving the right cerebellum and medial right parieto-occipital region.  Vessel imaging was shows vascular irregularity involving bilateral vertebral  arteries with ?right dissection, concerning for fibromuscular dysplasia but I do not appreciate this well on my review.  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  She has been started on aspirin  and lipitor  daily.  No personal or family history of stroke.  No personal history of hypertension.  Of note, she has received cervical manipulation for stiffness this month, which alleviated her neck discomfort.   Out-side paper records, electronic medical record, and images have been reviewed where available and summarized as:  MRI brain 08/27/2014:  Subacute nonhemorrhagic small infarcts involving mid to inferior right cerebellum and medial aspect of the right parietal -occipital lobe.  No intracranial hemorrhage.  MRA head 08/27/2014:  Ectatic cavernous segment internal carotid artery bilaterally. Mild narrowing petrous segment of the internal carotid artery bilaterally.  Middle cerebral mild to moderate branch vessel narrowing and irregularity bilaterally greater on the right.  1.6 x 1.6 mm bulge M1 segment right middle cerebral artery appears to be origin of a vessel rather than saccular aneurysm. Stability can be confirmed on follow-up.  Moderate narrowing at the junction of the A1 segment and A2 segment of the right anterior cerebral artery.  Ectatic vertebral arteries and basilar artery without high-grade stenosis.  Full extent of the left posterior inferior cerebellar artery not imaged on present exam. This is noted on the contrast-enhanced MR angiogram of the neck and appears ectatic were proximal narrowing.  Nonvisualized left anterior inferior cerebellar artery.  Narrowing and irregularity superior cerebellar artery greater on the left.  Posterior cerebral artery branch vessel narrowing and irregularity greater on the left.  MRA NECK FINDINGS Ectatic pleated dilated appearance of the vertebral arteries bilaterally suggestive of fibromuscular dysplasia. With  the patient's subacute right sided infarcts, subtle focal dissection with subsequent embolic thrombus cannot be entirely excluded although not detected on the current exam  No significant stenosis of either carotid bifurcation. Ectatic internal carotid artery cervical segment bilaterally may also reflect changes of fibromuscular dysplasia but without pleated appearance as noted involving the vertebral arteries.  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  Past Medical History  Diagnosis Date  . Chronic back pain     Past Surgical History  Procedure Laterality Date  . Nasal septum surgery       Medications:  Current Outpatient Prescriptions on File Prior to Visit  Medication Sig Dispense Refill  . atorvastatin (LIPITOR) 20 MG tablet Take 1 tablet (20 mg total) by mouth daily. 30 tablet 1  . Ibuprofen-Famotidine 800-26.6 MG TABS Take 1 tablet by mouth 3 (three) times daily. 270 tablet 3  . traMADol (ULTRAM) 50 MG tablet Take 1 tablet (50 mg total) by mouth every 12 (twelve) hours as needed. 60 tablet 0   No current facility-administered medications on file prior to visit.    Allergies: No Known Allergies  Family History: Family History  Problem Relation Age of Onset  . Heart murmur Mother     Living, 71  . Alzheimer's disease Father     Living, 88  . Vascular Disease Brother   . Healthy Son   . Healthy Daughter     Social History: History   Social History  . Marital Status: Married    Spouse Name: N/A  . Number of Children: N/A  . Years of Education: N/A   Occupational History  . Not on file.   Social History Main Topics  . Smoking status: Never Smoker   . Smokeless tobacco: Never Used  . Alcohol Use: 0.0 oz/week    0 Standard drinks or equivalent per week     Comment: 3-4 drinks per week, on occasion  . Drug Use: No  . Sexual Activity: Not on file   Other Topics Concern  . Not on file   Social History Narrative   Lives with husband and 2  children in a 2 story home.     Works as an Airline pilot part time.     Education: BS degree.    Review of Systems:  CONSTITUTIONAL: No fevers, chills, night sweats, or weight loss.   EYES: No visual changes or eye pain ENT: No hearing changes.  No history of nose bleeds.   RESPIRATORY: No cough, wheezing and shortness of breath.   CARDIOVASCULAR: Negative for chest pain, and palpitations.   GI: Negative for abdominal discomfort, blood in stools or black stools.  No recent change in bowel habits.   GU:  No history of incontinence.   MUSCLOSKELETAL: No history of joint pain or swelling.  No myalgias.   SKIN: Negative for lesions, rash, and itching.   HEMATOLOGY/ONCOLOGY: Negative for prolonged bleeding, bruising easily, and swollen nodes.  No history of cancer.   ENDOCRINE: Negative for cold or heat intolerance, polydipsia or goiter.   PSYCH:  No depression +anxiety symptoms.   NEURO: As Above.   Vital Signs:  BP 110/70 mmHg  Pulse 78  Ht 5' (1.524 m)  Wt 113 lb 2 oz (51.313 kg)  BMI 22.09 kg/m2  SpO2 98%  LMP 06/03/2014 (Approximate)  General Medical Exam:   General:  Well appearing, comfortable.   Eyes/ENT: see cranial nerve examination.   Neck: No masses appreciated.  Full range of motion without tenderness.  No carotid bruits. Respiratory:  Clear to auscultation, good air entry bilaterally.   Cardiac:  Regular rate and rhythm, no murmur.   Extremities:  No deformities, edema, or skin discoloration.  Skin:  No rashes or lesions.  Neurological Exam: MENTAL STATUS including orientation to time, place, person, recent and remote memory, attention span and concentration, language, and fund of knowledge is normal.  Speech is not dysarthric.  CRANIAL NERVES: II:  No visual field defects.  Unremarkable fundi.   III-IV-VI: Pupils equal round and reactive to light.  Normal conjugate, extra-ocular eye movements in all directions of gaze.  No nystagmus.  No ptosis.   V:  Normal  facial sensation.     VII:  Normal facial symmetry and movements.  No pathologic facial reflexes.  VIII:  Normal hearing and vestibular function.   IX-X:  Normal palatal movement.   XI:  Normal shoulder shrug and head rotation.   XII:  Normal tongue strength and range of motion, no deviation or fasciculation.  MOTOR:  No atrophy, fasciculations or abnormal movements.  No pronator drift.  Tone is normal.    Right Upper Extremity:    Left Upper Extremity:    Deltoid  5/5   Deltoid  5/5   Biceps  5/5   Biceps  5/5   Triceps  5/5   Triceps  5/5   Wrist extensors  5/5   Wrist extensors  5/5   Wrist flexors  5/5   Wrist flexors  5/5   Finger extensors  5/5   Finger extensors  5/5   Finger flexors  5/5   Finger flexors  5/5   Dorsal interossei  5/5   Dorsal interossei  5/5   Abductor pollicis  5/5   Abductor pollicis  5/5   Tone (Ashworth scale)  0  Tone (Ashworth scale)  0   Right Lower Extremity:    Left Lower Extremity:    Hip flexors  5/5   Hip flexors  5/5   Hip extensors  5/5   Hip extensors  5/5   Knee flexors  5/5   Knee flexors  5/5   Knee extensors  5/5   Knee extensors  5/5   Dorsiflexors  5/5   Dorsiflexors  5/5   Plantarflexors  5/5   Plantarflexors  5/5   Toe extensors  5/5   Toe extensors  5/5   Toe flexors  5/5   Toe flexors  5/5   Tone (Ashworth scale)  0  Tone (Ashworth scale)  0   MSRs:  Right                                                                 Left brachioradialis 2+  brachioradialis 2+  biceps 2+  biceps 2+  triceps 2+  triceps 2+  patellar 2+  patellar 2+  ankle jerk 2+  ankle jerk 2+  Hoffman no  Hoffman no  plantar response down  plantar response down   SENSORY:  Normal and symmetric perception of light touch, pinprick, vibration, and proprioception.  Romberg's sign absent.  COORDINATION/GAIT: Normal finger-to- nose-finger and heel-to-shin.  Intact rapid alternating movements bilaterally.  Able to rise from a chair without using arms.  Gait  narrow based and stable. Tandem and stressed gait intact.   NIH stroke scale: 0/44   IMPRESSION: Mrs. Mcdermott is a 49 year-old female presenting for evaluation of right parieto-occipital and right cerebellar stroke in the setting of possible fibromuscular dysplasia.  Her neurological exam is entirely normal and non-focal. I personally reviewed patient MRI/A brain and neck which shows embolic appearing right PCA and SCA/PICA territory infarcts.   Vessel imaging does shows intracranial stenosis with some irregularity, which is worse on the left posterior circulation, but I cannot appreciate classic beaded appearance which is seen in fibromuscular dysplasia. Although FMD is possible, intracranial atherosclerosis, as well as vasculitis (including RCVS) needs to be considered.  I would like a second imaging modality for her vasculature and we discussed CT angiogram vs. cerebral angiogram and will seek the opinion of endovascular specialist for cerebral angiogram.   PLAN/RECOMMENDATIONS:  1.  Check lipid panel, HbA1c, CMP.  Pending results, consider consider vasculitis panel going forward 2.  Continue aspirin 325mg  daily and lipitor 20mg  daily 3.  Echocardiogram, cardiac monitor 4.  Referral to neurosurgery for evaluation for cerebral angiogram 5.  Stroke warning signs discussed 6.  Patient has a hiking trip planned with her family in early June but with her recent stroke an unclear mechanism, I do not recommend air travel in the first 30-days of an acute stroke due increased stroke risk during this time   7.  Return to clinic in 1 month   The duration of this appointment visit was 65 minutes of face-to-face time with the patient.  Greater than 50% of this time was spent in counseling, explanation of diagnosis, planning of further management, and coordination of care.   Thank you for allowing me to participate in patient's care.  If I can answer any additional questions, I would be pleased to do so.     Sincerely,    Ahaana Rochette K. Allena Katz, DO

## 2014-08-30 ENCOUNTER — Other Ambulatory Visit: Payer: Self-pay | Admitting: Neurology

## 2014-08-30 ENCOUNTER — Telehealth: Payer: Self-pay | Admitting: *Deleted

## 2014-08-30 ENCOUNTER — Ambulatory Visit (INDEPENDENT_AMBULATORY_CARE_PROVIDER_SITE_OTHER): Payer: Managed Care, Other (non HMO)

## 2014-08-30 ENCOUNTER — Telehealth: Payer: Self-pay | Admitting: Neurology

## 2014-08-30 DIAGNOSIS — I773 Arterial fibromuscular dysplasia: Secondary | ICD-10-CM | POA: Diagnosis not present

## 2014-08-30 DIAGNOSIS — R42 Dizziness and giddiness: Secondary | ICD-10-CM | POA: Diagnosis not present

## 2014-08-30 DIAGNOSIS — I639 Cerebral infarction, unspecified: Secondary | ICD-10-CM

## 2014-08-30 DIAGNOSIS — I6350 Cerebral infarction due to unspecified occlusion or stenosis of unspecified cerebral artery: Secondary | ICD-10-CM | POA: Diagnosis not present

## 2014-08-30 NOTE — Telephone Encounter (Signed)
Noted  

## 2014-08-30 NOTE — Telephone Encounter (Signed)
Sue Lushndrea called and and has a [facility] they do not have any appointments until July and she wanted to know if you knew any others she could call for an earlier appointment/Dawn

## 2014-08-30 NOTE — Telephone Encounter (Signed)
Patient appointment was moved to 06/06 with Dr. Antoine PocheHochrein

## 2014-08-30 NOTE — Progress Notes (Signed)
Referral and note sent.

## 2014-08-31 ENCOUNTER — Other Ambulatory Visit: Payer: Self-pay

## 2014-08-31 ENCOUNTER — Ambulatory Visit (HOSPITAL_COMMUNITY): Payer: Managed Care, Other (non HMO) | Attending: Cardiology

## 2014-08-31 DIAGNOSIS — G459 Transient cerebral ischemic attack, unspecified: Secondary | ICD-10-CM | POA: Insufficient documentation

## 2014-08-31 DIAGNOSIS — I773 Arterial fibromuscular dysplasia: Secondary | ICD-10-CM

## 2014-08-31 DIAGNOSIS — I6350 Cerebral infarction due to unspecified occlusion or stenosis of unspecified cerebral artery: Secondary | ICD-10-CM | POA: Diagnosis not present

## 2014-08-31 DIAGNOSIS — I639 Cerebral infarction, unspecified: Secondary | ICD-10-CM

## 2014-08-31 NOTE — Telephone Encounter (Signed)
Note received that patient will be seen on 09-05-14.

## 2014-09-04 NOTE — Telephone Encounter (Signed)
Returned Ashley's call/Dawn

## 2014-09-07 NOTE — Telephone Encounter (Signed)
Has this been completed? Is encounter ready to be closed? / Sherri S.

## 2014-09-10 ENCOUNTER — Encounter: Payer: Self-pay | Admitting: Cardiology

## 2014-09-10 ENCOUNTER — Ambulatory Visit (INDEPENDENT_AMBULATORY_CARE_PROVIDER_SITE_OTHER): Payer: Managed Care, Other (non HMO) | Admitting: Cardiology

## 2014-09-10 ENCOUNTER — Telehealth: Payer: Self-pay | Admitting: Neurology

## 2014-09-10 VITALS — BP 120/80 | HR 71 | Ht 60.0 in | Wt 110.3 lb

## 2014-09-10 DIAGNOSIS — I773 Arterial fibromuscular dysplasia: Secondary | ICD-10-CM | POA: Diagnosis not present

## 2014-09-10 NOTE — Progress Notes (Signed)
Cardiology Office Note   Date:  09/10/2014   ID:  Rachel Ormondndrea Hustead, DOB 12/21/1965, MRN [id number]  PCP:  Gaye AlkenBARNES,ELIZABETH STEWART, MD  Cardiologist:   Rollene RotundaJames Destany Severns, MD   No chief complaint on file.     History of Present Illness: Rachel Golden is a 49 y.o. female who presents for  Evaluation of dizziness and rushing in her ears. She has had a couple of episodes of this. She describes them with one episode after bending over and developing a brief headache. This was followed by some tingling in her fingers and around her mouth. She was evaluated by EMS but didn't require transport to the hospital. She had another episode of blurred vision any "whirling " sound in her years while she was getting out of car. She has been to see her sports physician. She subsequently was sent to a neurologist. 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   There was also evidence of nonhemorrhagic small infarcts subacute involving the mid to inferior right cerebellum and medial aspect of the right parietal-occipital lobe.   She has also been seen by neurosurgery but so far angiography is not suggested. A transthoracic echo with bubble study demonstrated no significant abnormalities. She has been aspirin. She has also been treated with Lipitor. Her lipid profile was excellent. She's had no further symptoms. She otherwise has been active. She denies any cardiovascular symptoms such as palpitations, presyncope or syncope. She has had no chest pressure, neck or arm discomfort. She has had no weight gain or edema. She actually ran a 5K between her episodic symptoms.  Past Medical History  Diagnosis Date  . Chronic back pain     Past Surgical History  Procedure Laterality Date  . Nasal septum surgery       Current Outpatient Prescriptions  Medication Sig Dispense Refill  . aspirin 325 MG tablet Take 325 mg by mouth daily.    Marland Kitchen. atorvastatin (LIPITOR) 20 MG tablet Take 1 tablet (20 mg  total) by mouth daily. 30 tablet 1  . Ibuprofen-Famotidine 800-26.6 MG TABS Take 1 tablet by mouth 3 (three) times daily. 270 tablet 3  . traMADol (ULTRAM) 50 MG tablet Take 1 tablet (50 mg total) by mouth every 12 (twelve) hours as needed. 60 tablet 0  . ZORVOLEX 18 MG CAPS   1   No current facility-administered medications for this visit.    Allergies:   Review of patient's allergies indicates no known allergies.    Social History:  The patient  reports that she has never smoked. She has never used smokeless tobacco. She reports that she drinks alcohol. She reports that she does not use illicit drugs.   Family History:  The patient's family history includes Alzheimer's disease in her father; Healthy in her daughter and son; Heart murmur in her mother; Vascular Disease in her brother.    ROS:  Please see the history of present illness.   Otherwise, review of systems are positive for none.   All other systems are reviewed and negative.    PHYSICAL EXAM: VS:  LMP 06/03/2014 (Approximate) , BMI There is no weight on file to calculate BMI. GENERAL:  Well appearing HEENT:  Pupils equal round and reactive, fundi not visualized, oral mucosa unremarkable NECK:  No jugular venous distention, waveform within normal limits, carotid upstroke brisk and symmetric, no bruits, no thyromegaly LYMPHATICS:  No cervical, inguinal adenopathy LUNGS:  Clear to auscultation bilaterally BACK:  No CVA tenderness CHEST:  Unremarkable HEART:  PMI not displaced or sustained,S1 and S2 within normal limits, no S3, no S4, no clicks, no rubs, no murmurs ABD:  Flat, positive bowel sounds normal in frequency in pitch, no bruits, no rebound, no guarding, no midline pulsatile mass, no hepatomegaly, no splenomegaly EXT:  2 plus pulses throughout, no edema, no cyanosis no clubbing SKIN:  No rashes no nodules NEURO:  Cranial nerves II through XII grossly intact, motor grossly intact throughout PSYCH:  Cognitively intact,  oriented to person place and time    EKG:  EKG is ordered today. The ekg ordered today demonstrates  Sinus rhythm, sinus arrhythmia, rate 71 , left axis deviation, no acute ST-T wave changes.   Recent Labs: 08/23/2014: Hemoglobin 14.1; Platelets 287.0; TSH 0.72 08/29/2014: ALT 12; BUN 17; Creatinine 0.64; Potassium 4.9; Sodium 137    Lipid Panel    Component Value Date/Time   CHOL 158 08/29/2014 1053   TRIG 133 08/29/2014 1053   HDL 76 08/29/2014 1053   CHOLHDL 2.1 08/29/2014 1053   VLDL 27 08/29/2014 1053   LDLCALC 55 08/29/2014 1053      Wt Readings from Last 3 Encounters:  08/29/14 113 lb 2 oz (51.313 kg)  08/23/14 115 lb (52.164 kg)  08/14/14 115 lb (52.164 kg)      Other studies Reviewed: Additional studies/ records that were reviewed today include: MRI, Neuro and primary care records.  Echo. Review of the above records demonstrates:  Please see elsewhere in the note.     ASSESSMENT AND PLAN:  EMBOLIC CVA:   There is evidence for small embolic CVA. However, workup thus far is negative. I will look at the Holter results. We could consider further arrhythmia testing with a loop recorder. However, I think this is low yield as she has no symptoms. I will discuss with neurology whether they think it would be any utility in a transesophageal echocardiogram again though I think this would be low yield. She has a normal transthoracic and no significant risk factors or suggestion of structural heart disease. We did discuss the dosing of her aspirin could safely be 162 mg.   Current medicines are reviewed at length with the patient today.  The patient has concerns regarding medicines.  The following changes have been made:  See above  Labs/ tests ordered today include: None  No orders of the defined types were placed in this encounter.     Disposition:   FU with me as needed.      Signed, Rollene Rotunda, MD  09/10/2014 7:34 AM    [facility]

## 2014-09-10 NOTE — Patient Instructions (Signed)
Your physician recommends that you schedule a follow-up appointment in: As Needed    

## 2014-09-10 NOTE — Telephone Encounter (Signed)
I called Cardiology back to let them know that I do not see results in epic.  She said that they are calling the [address] office where it was done to see if they have the results.

## 2014-09-10 NOTE — Telephone Encounter (Signed)
Nya with  heart center called in regards to seeing if they could get the results of pt's holter monitor faxed over to them/dawn Fax#[phone]/Ph:[phone]

## 2014-09-19 ENCOUNTER — Other Ambulatory Visit: Payer: Self-pay | Admitting: *Deleted

## 2014-09-19 DIAGNOSIS — I639 Cerebral infarction, unspecified: Secondary | ICD-10-CM

## 2014-09-19 DIAGNOSIS — I773 Arterial fibromuscular dysplasia: Secondary | ICD-10-CM

## 2014-09-19 DIAGNOSIS — R42 Dizziness and giddiness: Secondary | ICD-10-CM

## 2014-09-25 ENCOUNTER — Other Ambulatory Visit (INDEPENDENT_AMBULATORY_CARE_PROVIDER_SITE_OTHER): Payer: Managed Care, Other (non HMO)

## 2014-09-25 ENCOUNTER — Telehealth: Payer: Self-pay | Admitting: *Deleted

## 2014-09-25 DIAGNOSIS — I6350 Cerebral infarction due to unspecified occlusion or stenosis of unspecified cerebral artery: Secondary | ICD-10-CM

## 2014-09-25 DIAGNOSIS — I773 Arterial fibromuscular dysplasia: Secondary | ICD-10-CM

## 2014-09-25 DIAGNOSIS — I639 Cerebral infarction, unspecified: Secondary | ICD-10-CM

## 2014-09-25 LAB — RHEUMATOID FACTOR: Rhuematoid fact SerPl-aCnc: <10 IU/mL (ref <=14)

## 2014-09-25 LAB — C-REACTIVE PROTEIN

## 2014-09-25 NOTE — Telephone Encounter (Signed)
I called [facility] to re-schedule 30 day event monitor.  Left message for Felicia to call me back.

## 2014-09-26 ENCOUNTER — Other Ambulatory Visit: Payer: Self-pay | Admitting: *Deleted

## 2014-09-26 DIAGNOSIS — I639 Cerebral infarction, unspecified: Secondary | ICD-10-CM

## 2014-09-26 DIAGNOSIS — I773 Arterial fibromuscular dysplasia: Secondary | ICD-10-CM

## 2014-09-26 LAB — C3 AND C4
C3 Complement: 95 mg/dL (ref 90–180)
C4 Complement: 23 mg/dL (ref 10–40)

## 2014-09-26 LAB — ENA 9 PANEL
Centromere Ab Screen: <1
ENA SM AB SER-ACNC: NEGATIVE
Jo-1 Antibody, IgG: <1
Ribosomal P Protein Ab: <1
SM/RNP: <1
SSA (Ro) (ENA) Antibody, IgG: <1
SSB (La) (ENA) Antibody, IgG: <1
Scleroderma (Scl-70) (ENA) Antibody, IgG: <1
ds DNA Ab: 1 IU/mL

## 2014-09-26 LAB — ANA: ANA: NEGATIVE

## 2014-09-26 LAB — SEDIMENTATION RATE: Sed Rate: 9 mm/hr (ref 0–22)

## 2014-09-26 LAB — CRYOGLOBULIN

## 2014-09-27 ENCOUNTER — Other Ambulatory Visit: Payer: Managed Care, Other (non HMO)

## 2014-09-27 DIAGNOSIS — I639 Cerebral infarction, unspecified: Secondary | ICD-10-CM

## 2014-09-27 DIAGNOSIS — I773 Arterial fibromuscular dysplasia: Secondary | ICD-10-CM

## 2014-09-29 LAB — HYPERCOAGULABLE PANEL, COMPREHENSIVE
AntiThromb III Func: 109 % (ref 76–126)
Anticardiolipin IgA: 3 APL U/mL (ref <22)
Anticardiolipin IgG: 9 GPL U/mL (ref <23)
Anticardiolipin IgM: 5 MPL U/mL (ref <11)
Beta-2 Glyco I IgG: 5 G Units (ref <20)
Beta-2-Glycoprotein I IgA: 12 A Units (ref <20)
Beta-2-Glycoprotein I IgM: 7 M Units (ref <20)
DRVVT: 35 s (ref <42.9)
LUPUS ANTICOAGULANT: NOT DETECTED
PROTEIN C ACTIVITY: 187 % — AB (ref 75–133)
PTT Lupus Anticoagulant: 33.6 secs (ref 28.0–43.0)
Protein S Activity: 104 % (ref 69–129)

## 2014-10-03 ENCOUNTER — Ambulatory Visit: Payer: Managed Care, Other (non HMO) | Admitting: Neurology

## 2014-10-03 LAB — PROTEIN S, TOTAL: PROTEIN S ANTIGEN, TOTAL: 87 % (ref 70–140)

## 2014-10-03 LAB — PROTEIN C, TOTAL: Protein C Antigen: 136 % (ref 70–140)

## 2014-10-04 ENCOUNTER — Ambulatory Visit (INDEPENDENT_AMBULATORY_CARE_PROVIDER_SITE_OTHER): Payer: Managed Care, Other (non HMO) | Admitting: Family Medicine

## 2014-10-04 ENCOUNTER — Encounter: Payer: Self-pay | Admitting: Neurology

## 2014-10-04 ENCOUNTER — Ambulatory Visit (INDEPENDENT_AMBULATORY_CARE_PROVIDER_SITE_OTHER): Payer: Managed Care, Other (non HMO) | Admitting: Neurology

## 2014-10-04 ENCOUNTER — Telehealth: Payer: Self-pay | Admitting: *Deleted

## 2014-10-04 ENCOUNTER — Encounter: Payer: Self-pay | Admitting: Family Medicine

## 2014-10-04 VITALS — BP 104/66 | HR 78 | Ht 60.0 in | Wt 112.0 lb

## 2014-10-04 VITALS — BP 108/64 | HR 75 | Ht 60.0 in | Wt 112.2 lb

## 2014-10-04 DIAGNOSIS — M62838 Other muscle spasm: Secondary | ICD-10-CM | POA: Diagnosis not present

## 2014-10-04 DIAGNOSIS — M9904 Segmental and somatic dysfunction of sacral region: Secondary | ICD-10-CM

## 2014-10-04 DIAGNOSIS — I6350 Cerebral infarction due to unspecified occlusion or stenosis of unspecified cerebral artery: Secondary | ICD-10-CM | POA: Diagnosis not present

## 2014-10-04 DIAGNOSIS — M542 Cervicalgia: Secondary | ICD-10-CM | POA: Diagnosis not present

## 2014-10-04 DIAGNOSIS — I773 Arterial fibromuscular dysplasia: Secondary | ICD-10-CM | POA: Diagnosis not present

## 2014-10-04 DIAGNOSIS — I639 Cerebral infarction, unspecified: Secondary | ICD-10-CM

## 2014-10-04 DIAGNOSIS — M25511 Pain in right shoulder: Secondary | ICD-10-CM | POA: Diagnosis not present

## 2014-10-04 DIAGNOSIS — M999 Biomechanical lesion, unspecified: Secondary | ICD-10-CM

## 2014-10-04 DIAGNOSIS — M9902 Segmental and somatic dysfunction of thoracic region: Secondary | ICD-10-CM

## 2014-10-04 MED ORDER — ASPIRIN EC 81 MG PO TBEC: 81.0000 mg | DELAYED_RELEASE_TABLET | Freq: Every day | ORAL | Status: AC

## 2014-10-04 MED ORDER — ATORVASTATIN CALCIUM 10 MG PO TABS: 10.0000 mg | ORAL_TABLET | Freq: Every day | ORAL | Status: DC

## 2014-10-04 MED ORDER — CYCLOBENZAPRINE HCL 5 MG PO TABS: 2.5000 mg | ORAL_TABLET | Freq: Every evening | ORAL | Status: DC | PRN

## 2014-10-04 NOTE — Telephone Encounter (Signed)
-----   Message from Glendale Chardonika K Patel, DO sent at 10/04/2014  7:45 AM EDT ----- Regarding: F/U appt Rachel Golden,  Can you kindly see if this patient is willing to come in today at 3pm?  If so, please hold the 3:30pm slot for my Friday 3:30pm patient Bellows - I want to see if he can come in today.  Morrie Sheldonshley, do you think Bellows could come today? Please say that I really would like to go over the results asap.   Thanks,  Harrah's EntertainmentDonika

## 2014-10-04 NOTE — Progress Notes (Signed)
Follow-up Visit   Date: 10/04/2014    Rachel Golden MRN: [id number] DOB: 02/23/1966   Interim History: Rachel Golden is a 49 y.o. right-handed Caucasian female with chronic back pain returning to the clinic for follow-up of fibromuscular dysplasia and stroke.  The patient was accompanied to the clinic by husband who also provides collateral information.    History of present illness: On May 13th, she was picking up a hanger off the floor and developed acute onset of dizziness.  She initially thought it was because she got up too quickly.  She then heard a "swooshing" sound in her head and developed a piercing headache.  She took advil which alleviated her headache. Swooshing sound resolved within 5 minutes.  Within 30-minutes, she developed right hand tingling and numbness of the lips.  She called 911 who evaluated her and did not feel that her symptoms were consistent with a stroke, so was not taken to the emergency department.  She also recalls having neck pain especially with neck rotation to the right.   On May 19th, she was getting out of the car and developed neck stiffness and heard a swooshing sound in her right ear with associated brief but severe headache.  She also has associated vision disturbance described as "kelidoscope" vision, dizzy, and nauseous. She had difficulty walking out of her car to her home, but was able to make it into her home slowly.  Symptoms lasted about 10-minutes.   She again went to see Dr. [name] on the same day who ordered MRI/A of her head.  Imaging shows small infarcts involving the right cerebellum and medial right parieto-occipital region. Vessel imaging was shows vascular irregularity involving bilateral vertebral arteries with ?right dissection, concerning for fibromuscular dysplasia but I do not appreciate this well on my review.  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  She was started on aspirin 333m and lipitor 246mdaily.  No personal or family  history of stroke.  No personal history of hypertension.  Of note, she has received cervical manipulation for stiffness this month, which alleviated her neck discomfort.  UPDATE 10/04/2014:  [facility] [place] having a sensation of dizziness lasting for about 5 minutes while there.  There was no associated headache or whooshing sound.  The spells resolved and she has no had anything similar again. Her stroke work-up has been returning largely normal including echocardiogram, holter monitor, and vasculitis/hypercoagulable labs. She was very happy with her evaluation with Dr. NuKathyrn Sheriffho suggested that only if symptoms worsen or she develops new headache, would cerebral angiogram be indicated.    She endorses chronic neck stiffness and has benefit with warm compresses.     Medications:  Current Outpatient Prescriptions on File Prior to Visit  Medication Sig Dispense Refill  . aspirin 325 MG tablet Take 325 mg by mouth daily.    . Marland Kitchentorvastatin (LIPITOR) 20 MG tablet Take 1 tablet (20 mg total) by mouth daily. 30 tablet 1  . Ibuprofen-Famotidine 800-26.6 MG TABS Take 1 tablet by mouth 3 (three) times daily. 270 tablet 3  . traMADol (ULTRAM) 50 MG tablet Take 1 tablet (50 mg total) by mouth every 12 (twelve) hours as needed. 60 tablet 0  . ZORVOLEX 18 MG CAPS as needed.   1   No current facility-administered medications on file prior to visit.    Allergies: No Known Allergies  Review of Systems:  CONSTITUTIONAL: No fevers, chills, night sweats, or weight loss.  EYES: No visual changes or eye pain ENT: No hearing changes.  No history of nose bleeds.   RESPIRATORY: No cough, wheezing and shortness of breath.   CARDIOVASCULAR: Negative for chest pain, and palpitations.   GI: Negative for abdominal discomfort, blood in stools or black stools.  No recent change in bowel habits.   GU:  No history of incontinence.     MUSCLOSKELETAL: No history of joint pain or swelling.  No myalgias.   SKIN: Negative for lesions, rash, and itching.   ENDOCRINE: Negative for cold or heat intolerance, polydipsia or goiter.   PSYCH:  No depression or anxiety symptoms.   NEURO: As Above.   Vital Signs:  BP 108/64 mmHg  Pulse 75  Ht 5' (1.524 m)  Wt 112 lb 4 oz (50.916 kg)  BMI 21.92 kg/m2  SpO2 98%  Neurological Exam: MENTAL STATUS including orientation to time, place, person, recent and remote memory, attention span and concentration, language, and fund of knowledge is normal.  Speech is not dysarthric.  CRANIAL NERVES: No visual field defects. Pupils equal round and reactive to light.  Normal conjugate, extra-ocular eye movements in all directions of gaze.  No ptosis. Normal facial sensation.  Face is symmetric. Palate elevates symmetrically.  Tongue is midline.  MOTOR:  Motor strength is 5/5 in all extremities.  No atrophy, fasciculations or abnormal movements.  No pronator drift.  Tone is normal.    MSRs:  Reflexes are 2+/4 throughout.  SENSORY:  Intact to vibration.  COORDINATION/GAIT:  Normal finger-to- nose-finger and heel-to-shin.  Intact rapid alternating movements bilaterally.  Gait narrow based and stable. Tandem and stressed gait intact.  Data:  Labs 08/23/2014:  CRP <0.1, ESR 12, ANA neg, HbA1c 5.5, LDL 55, HDL 76, C3/C4 normal, ENA neg, ANA neg, RF neg, hypercoagulable panel negative (normal protein C and S activity, no Factor V leiden mutation, normal antithrombin III and anticardiolipin antibody)  Surface echocardiogram 08/31/2014:  normal, EF 60% no thrombus  08/30/2014  48-hr holter:   No significant arrhythmias.  No atrial fib or etiology for an embolic neuro event.  Rare atrial and ventricular ectopy noted.  Predominant rhythm is sinus.      MRI brain 08/27/2014:  Subacute nonhemorrhagic small infarcts involving mid to inferior right cerebellum and medial aspect of the right parietal -occipital  lobe.  No intracranial hemorrhage.  MRA head 08/27/2014:  Ectatic cavernous segment internal carotid artery bilaterally. Mild narrowing petrous segment of the internal carotid artery bilaterally. Middle cerebral mild to moderate branch vessel narrowing and irregularity bilaterally greater on the right.   1.6 x 1.6 mm bulge M1 segment right middle cerebral artery appears to be origin of a vessel rather than saccular aneurysm. Stability can be confirmed on follow-up.   Moderate narrowing at the junction of the A1 segment and A2 segment of the right anterior cerebral artery. Ectatic vertebral arteries and basilar artery without high-grade stenosis.  Full extent of the left posterior inferior cerebellar artery not imaged on present exam. This is noted on the contrast-enhanced MR angiogram of the neck and appears ectatic were proximal narrowing.  Nonvisualized left anterior inferior cerebellar artery. Narrowing and irregularity superior cerebellar artery greater on the left. Posterior cerebral artery branch vessel narrowing and irregularity greater on the left.   MRA NECK FINDINGS Ectatic pleated dilated appearance of the vertebral arteries bilaterally suggestive of fibromuscular dysplasia. With the patient's subacute right sided infarcts, subtle focal dissection  with subsequent embolic thrombus cannot be entirely excluded although not detected on the current exam   No significant stenosis of either carotid bifurcation. Ectatic internal carotid artery cervical segment bilaterally may also reflect changes of fibromuscular dysplasia but without pleated appearance as noted involving the vertebral arteries.    IMPRESSION/PLAN: 1.  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  If new headache, low threshold for cerebral angiogram  - Given that there is no significant evidence of atherosclerosis and her LDL is great (55), reduce lipitor to 974m  - Reduce aspirin to 871mdaily  2.  Cervical muscle strain  - Start cyclobenzaprine 2.74m56mhs, if tolerating, increase to 74mg78ms prn muscle spasm   Return to clinic in 4-5 months  The duration of this appointment visit was 40 minutes of face-to-face time with the patient.  Greater than 50% of this time was spent in counseling, explanation of diagnosis, planning of further management, and coordination of care.   Thank you for allowing me to participate in patient's care.  If I can answer any additional questions, I would be pleased to do so.    Sincerely,    Eryn Krejci K. PatePosey Pronto

## 2014-10-04 NOTE — Telephone Encounter (Signed)
Patient in coming in at 3:00.

## 2014-10-04 NOTE — Assessment & Plan Note (Signed)
Decision today to treat with OMT was based on Physical Exam  After verbal consent patient was treated with HVLA, ME techniques in , thoracic lumbar and sacral areas and only indirect technique on cervical spine  Patient tolerated the procedure well with improvement in symptoms  Patient given exercises, stretches and lifestyle modifications  See medications in patient instructions if given  Patient will follow up in 3-4 weeks

## 2014-10-04 NOTE — Progress Notes (Signed)
Subjective:    CC: Acute neck pain  HPI: Patient is a very pleasant 49 year old established patient of mine coming in for manipulation therapy. Patient is having an acute exacerbation of her neck pain.  Patient did have significant workup and please seeingpatient was diagnosed with more of a fibromuscular dysplasia with signs and symptoms that were corresponding to findings of an embolic CVA. This was diagnosed more on MRI and patient has seen specialists for this. There is also concerned and patient may be having arrhythmia and she's been set up for a 30 day all to monitor and possibly a transesophageal echocardiogram with bubble study. Patient's aspirin was increased 162 mg. Patient states overall she has been feeling relatively well but is having some more lower back pain as well as some mild neck pain. Seems to be more of her musculoskeletal in nature. Denies any significant headaches and has not had any of those other events significantly. Patient states that there was one time when she was [place] in [place] where she felt a little discomfort and then had the same visual change but seemed to resolve within seconds. Patient is following up with neurology for this. Patient states though otherwise her back pain seems to be worse when she is still and better with movement. Patient is remaining active in trying to run on a regular basis.  Past medical history, Surgical history, Family history not pertinant except as noted below, Social history, Allergies, and medications have been entered into the medical record, reviewed, and no changes needed.   Review of Systems: No fevers, chills, night sweats, weight loss, chest pain, or shortness of breath. Patient also denies any abdominal pain, any diarrhea or constipation, any nausea or vomiting.  Objective:   Blood pressure 104/66, pulse 78, height 5' (1.524 m), weight 112 lb (50.803 kg), SpO2 98 %.  General: Well Developed, well nourished, and in no acute  distress.  Neuro: Alert and oriented x3, extra-ocular muscles intact, sensation grossly intact.  HEENT: Normocephalic, atraumatic, pupils equal round reactive to light, neck supple, no masses, no lymphadenopathy, thyroid nonpalpable.  Skin: Warm and dry, no rashes. Cardiac:  no lower extremity edema. No carotid bruit noted, regular rate and rhythm no murmur Respiratory: Not using accessory muscles, speaking in full sentences. Abdominal: NT, soft Gait: Nonantlagic, good balance and coordination, patient does have significant shortening of the right leg compared to the contralateral side. Lymphatic: no lymphadenopathy in neck or axillae on palpation, non tender.  Musculoskeletal: Inspection and palpation of the right and left upper extremities including the shoulders elbows and wrist are unremarkable with full range of motion and good muscle strength and tone. Inspection and palpation of the right and left lower extremities including the hips knees and ankles are unremarkable and nontender with full range of motion and good muscle strength and tone and are symmetric. Neck: Inspection unremarkable. No palpable stepoffs. Negative Spurling's maneuver. Continued limitation with right-sided rotation and side bending less than previous exam Grip strength and sensation normal in bilateral hands Strength good C4 to T1 distribution No sensory change to C4 to T1 Negative Hoffman sign bilaterally Reflexes normal Mild scoliosis of the lumbar spine  Osteopathic manipulation findings  Cervical  C2 flexed rotated and side bent right Thoracic  T5 extended rotated and side bent left   Lumbar:  L2 flexed rotated and side bent right  L5 flexed rotated and side bent left   Sacrum  Left on left   Impression and Recommendations:

## 2014-10-04 NOTE — Patient Instructions (Signed)
Good to se you Ice is your friend 2 tennisball in a tube sock and lay on them where head meets the neck.  New exercises for upper back 3 times a week Continue to stay active!!!! See me again in 4 weeks.   Standing:  Secure a rubber exercise band/tubing so that it is at the height of your shoulders when you are either standing or sitting on a firm arm-less chair.  Grasp an end of the band/tubing in each hand and have your palms face each other. Straighten your elbows and lift your hands straight in front of you at shoulder height. Step back away from the secured end of band/tubing until it becomes tense.  Squeeze your shoulder blades together. Keeping your elbows locked and your hands at shoulder-height, bring your hands out to your side.  Hold __________ seconds. Slowly ease the tension on the band/tubing as you reverse the directions and return to the starting position. Repeat __________ times. Complete this exercise __________ times per day. STRENGTH - Scapular Retractors  Secure a rubber exercise band/tubing so that it is at the height of your shoulders when you are either standing or sitting on a firm arm-less chair.  With a palm-down grip, grasp an end of the band/tubing in each hand. Straighten your elbows and lift your hands straight in front of you at shoulder height. Step back away from the secured end of band/tubing until it becomes tense.  Squeezing your shoulder blades together, draw your elbows back as you bend them. Keep your upper arm lifted away from your body throughout the exercise.  Hold __________ seconds. Slowly ease the tension on the band/tubing as you reverse the directions and return to the starting position. Repeat __________ times. Complete this exercise __________ times per day. STRENGTH - Shoulder Extensors   Secure a rubber exercise band/tubing so that it is at the height of your shoulders when you are either standing or sitting on a firm arm-less  chair.  With a thumbs-up grip, grasp an end of the band/tubing in each hand. Straighten your elbows and lift your hands straight in front of you at shoulder height. Step back away from the secured end of band/tubing until it becomes tense.  Squeezing your shoulder blades together, pull your hands down to the sides of your thighs. Do not allow your hands to go behind you.  Hold for __________ seconds. Slowly ease the tension on the band/tubing as you reverse the directions and return to the starting position. Repeat __________ times. Complete this exercise __________ times per day.  STRENGTH - Scapular Retractors and External Rotators  Secure a rubber exercise band/tubing so that it is at the height of your shoulders when you are either standing or sitting on a firm arm-less chair.  With a palm-down grip, grasp an end of the band/tubing in each hand. Bend your elbows 90 degrees and lift your elbows to shoulder height at your sides. Step back away from the secured end of band/tubing until it becomes tense.  Squeezing your shoulder blades together, rotate your shoulder so that your upper arm and elbow remain stationary, but your fists travel upward to head-height.  Hold __________ for seconds. Slowly ease the tension on the band/tubing as you reverse the directions and return to the starting position. Repeat __________ times. Complete this exercise __________ times per day.  STRENGTH - Scapular Retractors and External Rotators, Rowing  Secure a rubber exercise band/tubing so that it is at the height of your shoulders  when you are either standing or sitting on a firm arm-less chair.  With a palm-down grip, grasp an end of the band/tubing in each hand. Straighten your elbows and lift your hands straight in front of you at shoulder height. Step back away from the secured end of band/tubing until it becomes tense.  Step 1: Squeeze your shoulder blades together. Bending your elbows, draw your hands  to your chest as if you are rowing a boat. At the end of this motion, your hands and elbow should be at shoulder-height and your elbows should be out to your sides.  Step 2: Rotate your shoulder to raise your hands above your head. Your forearms should be vertical and your upper-arms should be horizontal.  Hold for __________ seconds. Slowly ease the tension on the band/tubing as you reverse the directions and return to the starting position. Repeat __________ times. Complete this exercise __________ times per day.  STRENGTH - Scapular Retractors and Elevators  Secure a rubber exercise band/tubing so that it is at the height of your shoulders when you are either standing or sitting on a firm arm-less chair.  With a thumbs-up grip, grasp an end of the band/tubing in each hand. Step back away from the secured end of band/tubing until it becomes tense.  Squeezing your shoulder blades together, straighten your elbows and lift your hands straight over your head.  Hold for __________ seconds. Slowly ease the tension on the band/tubing as you reverse the directions and return to the starting position. Repeat __________ times. Complete this exercise __________ times per day.  Document Released: 03/23/2005 Document Revised: 06/15/2011 Document Reviewed: 07/05/2008 [facility] Patient Information 2015 [place], [place]. This information is not intended to replace advice given to you by your health care provider. Make sure you discuss any questions you have with your health care provider.

## 2014-10-04 NOTE — Patient Instructions (Signed)
1.  Reduce aspirin to 81mg  daily 2.  Reduce lipitor 10mg  daily 3.  Start cyclobenzaprine 2.5mg  (half tablet) at bedtime for muscle spasms 4.  Return to clinic in 635-months

## 2014-10-04 NOTE — Assessment & Plan Note (Addendum)
Believe the patient's neck pain is multifactorial. I do think that there could be an underlying cause for this could be secondary to more of a muscular dysplasia that does cause some exacerbation but I think this is lower likelihood. Mostly this is going to be positional as well as stress related. Patient did respond well to indirect techniques. We'll continue to monitor closely. Patient given more scapular exercises today that I think will be beneficial. Patient will come back and see me again in 4 weeks.

## 2014-10-04 NOTE — Progress Notes (Signed)
Pre visit review using our clinic review tool, if applicable. No additional management support is needed unless otherwise documented below in the visit note. 

## 2014-10-18 ENCOUNTER — Ambulatory Visit: Payer: Managed Care, Other (non HMO) | Admitting: Neurology

## 2014-10-29 ENCOUNTER — Ambulatory Visit: Payer: Managed Care, Other (non HMO) | Admitting: Internal Medicine

## 2014-12-04 ENCOUNTER — Ambulatory Visit (INDEPENDENT_AMBULATORY_CARE_PROVIDER_SITE_OTHER): Payer: Managed Care, Other (non HMO) | Admitting: Family Medicine

## 2014-12-04 ENCOUNTER — Encounter: Payer: Self-pay | Admitting: Family Medicine

## 2014-12-04 VITALS — BP 90/72 | HR 75 | Ht 60.0 in | Wt 113.0 lb

## 2014-12-04 DIAGNOSIS — M9902 Segmental and somatic dysfunction of thoracic region: Secondary | ICD-10-CM

## 2014-12-04 DIAGNOSIS — M5416 Radiculopathy, lumbar region: Secondary | ICD-10-CM

## 2014-12-04 DIAGNOSIS — M999 Biomechanical lesion, unspecified: Secondary | ICD-10-CM

## 2014-12-04 DIAGNOSIS — M9904 Segmental and somatic dysfunction of sacral region: Secondary | ICD-10-CM | POA: Diagnosis not present

## 2014-12-04 DIAGNOSIS — M9903 Segmental and somatic dysfunction of lumbar region: Secondary | ICD-10-CM | POA: Diagnosis not present

## 2014-12-04 NOTE — Assessment & Plan Note (Signed)
Patient is a more of a lumbar radiculopathy. We discussed icing regimen and home exercises. We discussed which activities to do a and which ones to avoid. I do believe with patient having a new finding I'm concerned that this is more of a nerve root impingement. This is affecting her daily activities as well as her job. I think it is worthwhile for advance imaging. This was ordered today. Patient come back 1-2 days later to discuss findings. No significant findings we'll consider a sacroiliac joint injection.  If findings are consistent with a nerve root we may consider epidural steroid injection.

## 2014-12-04 NOTE — Progress Notes (Signed)
Pre visit review using our clinic review tool, if applicable. No additional management support is needed unless otherwise documented below in the visit note. 

## 2014-12-04 NOTE — Progress Notes (Signed)
  Subjective:    CC: Low back pain followup  HPI: Patient is a very pleasant 49 year old established patient of mine coming in for manipulation therapy. Patient continues to improve.  Patient though states that the lateral aspect of the left hip continues to give her some mild discomfort  Patient states now that it seems to be going across her lower back. Affecting her daily activities and is having some mild radicular symptoms down the left leg. This is a new finding. Patient is trying to stay active and continue to run but is finding it difficult to do secondary to the pain.. Patient is concerned because it is starting to keep her from doing her regular activities or even a stretching. Seems to be worse after the activities and one going from a seated to standing position as well as. Sometimes worse at night as well.  Past medical history, Surgical history, Family history not pertinant except as noted below, Social history, Allergies, and medications have been entered into the medical record, reviewed, and no changes needed.   Review of Systems: No fevers, chills, night sweats, weight loss, chest pain, or shortness of breath. Patient also denies any abdominal pain, any diarrhea or constipation, any nausea or vomiting.  Objective:   Blood pressure 90/72, pulse 75, height 5' (1.524 m), weight 113 lb (51.256 kg), SpO2 96 %.  General: Well Developed, well nourished, and in no acute distress.  Neuro: Alert and oriented x3, extra-ocular muscles intact, sensation grossly intact.  HEENT: Normocephalic, atraumatic, pupils equal round reactive to light, neck supple, no masses, no lymphadenopathy, thyroid nonpalpable.  Skin: Warm and dry, no rashes. Cardiac:  no lower extremity edema. Respiratory: Not using accessory muscles, speaking in full sentences. Abdominal: NT, soft Gait: Nonantlagic, good balance and coordination, patient does have significant shortening of the right leg compared to the  contralateral side. Lymphatic: no lymphadenopathy in neck or axillae on palpation, non tender.  Musculoskeletal: Inspection and palpation of the right and left upper extremities including the shoulders elbows and wrist are unremarkable with full range of motion and good muscle strength and tone. Inspection and palpation of the right and left lower extremities including the hips knees and ankles are unremarkable and nontender with full range of motion and good muscle strength and tone and are symmetric. Back Exam:  Inspection: Unremarkable  Motion: Flexion 45 deg, Extension 45 deg, Side Bending to 45 deg bilaterally,  Rotation to 45 deg bilaterally  SLR laying:  Positive left side which is a new finding [employer]: Negative  Palpable tenderness:  Severe tenderness of her spinal musculature of the lumbar spine over L4-L5 on the left side as well as the left sacroiliac joint FABER: negative. Sensory change: Gross sensation intact to all lumbar and sacral dermatomes.  Reflexes: 2+ at both patellar tendons, 2+ at achilles tendons, Babinski's downgoing.  Strength at foot  Plantar-flexion: 5/5 Dorsi-flexion: 5/5 Eversion: 5/5 Inversion: 4/5  Leg strength  Quad: 5/5 Hamstring: 5/5 Hip flexor: 5/5 Hip abductors: 4/5  Gait unremarkable.   Osteopathic manipulation findings  Cervical   neutral Thoracic  T5 extended rotated and side bent left   Lumbar:  L2 flexed rotated and side bent right  L5 flexed rotated and side bent left   Sacrum  Left on left   Impression and Recommendations:

## 2014-12-04 NOTE — Assessment & Plan Note (Signed)
Decision today to treat with OMT was based on Physical Exam  After verbal consent patient was treated with HVLA, ME techniques in , thoracic lumbar and sacral areas and only indirect technique on cervical spine  Patient tolerated the procedure well with improvement in symptoms  Patient given exercises, stretches and lifestyle modifications  See medications in patient instructions if given  Patient will follow up in 4-6 weeks                      

## 2014-12-04 NOTE — Patient Instructions (Signed)
We will get MRI of your back.  See me again in 1-2 days after and we will discuss Good shoes is best Continue the exercises Continue what we are dong medicine wise Lipitor down to 3 times a week See you soon.

## 2014-12-11 ENCOUNTER — Ambulatory Visit
Admission: RE | Admit: 2014-12-11 | Discharge: 2014-12-11 | Disposition: A | Discharge status: Home / Self Care | Payer: Managed Care, Other (non HMO) | Source: Ambulatory Visit | Attending: Family Medicine | Admitting: Family Medicine

## 2014-12-11 DIAGNOSIS — M5416 Radiculopathy, lumbar region: Secondary | ICD-10-CM

## 2014-12-12 ENCOUNTER — Encounter: Payer: Self-pay | Admitting: Family Medicine

## 2014-12-12 DIAGNOSIS — M5416 Radiculopathy, lumbar region: Secondary | ICD-10-CM

## 2014-12-21 ENCOUNTER — Ambulatory Visit
Admission: RE | Admit: 2014-12-21 | Discharge: 2014-12-21 | Disposition: A | Discharge status: Home / Self Care | Payer: Managed Care, Other (non HMO) | Source: Ambulatory Visit | Attending: Family Medicine | Admitting: Family Medicine

## 2014-12-21 DIAGNOSIS — M5416 Radiculopathy, lumbar region: Secondary | ICD-10-CM

## 2014-12-21 MED ORDER — METHYLPREDNISOLONE ACETATE 40 MG/ML INJ SUSP (RADIOLOG
120.0000 mg | Freq: Once | INTRAMUSCULAR | Status: AC
  Administered 2014-12-21: 120 mg via EPIDURAL

## 2014-12-21 MED ORDER — IOHEXOL 180 MG/ML  SOLN
1.0000 mL | Freq: Once | INTRAMUSCULAR | Status: DC | PRN
  Administered 2014-12-21: 1 mL via EPIDURAL

## 2014-12-21 NOTE — Discharge Instructions (Signed)

## 2014-12-26 ENCOUNTER — Telehealth: Payer: Self-pay | Admitting: Family Medicine

## 2014-12-26 DIAGNOSIS — M5416 Radiculopathy, lumbar region: Secondary | ICD-10-CM

## 2014-12-26 NOTE — Telephone Encounter (Signed)
Pt is requesting to get another epidural done by Oct. 4th. She can be reached at [phone]

## 2014-12-26 NOTE — Telephone Encounter (Signed)
Order entered, pt made aware.  

## 2015-01-08 ENCOUNTER — Ambulatory Visit
Admission: RE | Admit: 2015-01-08 | Discharge: 2015-01-08 | Disposition: A | Discharge status: Home / Self Care | Payer: Managed Care, Other (non HMO) | Source: Ambulatory Visit | Attending: Family Medicine | Admitting: Family Medicine

## 2015-01-08 DIAGNOSIS — M5416 Radiculopathy, lumbar region: Secondary | ICD-10-CM

## 2015-01-08 MED ORDER — IOHEXOL 180 MG/ML  SOLN
1.0000 mL | Freq: Once | INTRAMUSCULAR | Status: DC | PRN
  Administered 2015-01-08: 1 mL via EPIDURAL

## 2015-01-08 MED ORDER — METHYLPREDNISOLONE ACETATE 40 MG/ML INJ SUSP (RADIOLOG
120.0000 mg | Freq: Once | INTRAMUSCULAR | Status: AC
Start: 2015-01-08 — End: 2015-01-08
  Administered 2015-01-08: 120 mg via EPIDURAL

## 2015-02-06 ENCOUNTER — Encounter: Payer: Self-pay | Admitting: *Deleted

## 2015-02-06 ENCOUNTER — Ambulatory Visit (INDEPENDENT_AMBULATORY_CARE_PROVIDER_SITE_OTHER): Payer: Managed Care, Other (non HMO) | Admitting: Family Medicine

## 2015-02-06 ENCOUNTER — Encounter: Payer: Self-pay | Admitting: Family Medicine

## 2015-02-06 VITALS — BP 108/72 | HR 80 | Ht 60.0 in | Wt 117.0 lb

## 2015-02-06 DIAGNOSIS — M9903 Segmental and somatic dysfunction of lumbar region: Secondary | ICD-10-CM

## 2015-02-06 DIAGNOSIS — M999 Biomechanical lesion, unspecified: Secondary | ICD-10-CM

## 2015-02-06 DIAGNOSIS — M9904 Segmental and somatic dysfunction of sacral region: Secondary | ICD-10-CM

## 2015-02-06 DIAGNOSIS — M9902 Segmental and somatic dysfunction of thoracic region: Secondary | ICD-10-CM

## 2015-02-06 DIAGNOSIS — M5416 Radiculopathy, lumbar region: Secondary | ICD-10-CM

## 2015-02-06 NOTE — Assessment & Plan Note (Signed)
Decision today to treat with OMT was based on Physical Exam  After verbal consent patient was treated with HVLA, ME techniques in , thoracic lumbar and sacral areas and only indirect technique on cervical spine  Patient tolerated the procedure well with improvement in symptoms  Patient given exercises, stretches and lifestyle modifications  See medications in patient instructions if given  Patient will follow up in 4-6 weeks

## 2015-02-06 NOTE — Progress Notes (Signed)
  Subjective:    CC: Low back pain followup  HPI: Patient is a very pleasant 49 year old established patient of mine coming in for manipulation therapy. Patient continues to improve.  Patient though states that the lateral aspect of the left hip continues to give her some mild discomfort  Patient states now that it seems to be going across her lower back. Affecting her daily activities and is having some mild radicular symptoms down the left leg. Patient did have a second epidural none 407 on the MRI. Patient states that she is still doing better. States that it is 60-70% better. Not as much radicular symptoms. Has been able to run on a fairly regular basis. Notices more pain at rest and truly with the exercises. Denies though any significant radicular symptoms at this time. Continues to do all the other modalities. Continues of vitamins. Patient of course would like to be pain-free but is happy with where she has at the moment.  Past medical history, Surgical history, Family history not pertinant except as noted below, Social history, Allergies, and medications have been entered into the medical record, reviewed, and no changes needed.   Review of Systems: No fevers, chills, night sweats, weight loss, chest pain, or shortness of breath. Patient also denies any abdominal pain, any diarrhea or constipation, any nausea or vomiting.  Objective:   Blood pressure 108/72, pulse 80, height 5' (1.524 m), weight 117 lb (53.071 kg), last menstrual period 06/03/2014, SpO2 95 %.  General: Well Developed, well nourished, and in no acute distress.  Neuro: Alert and oriented x3, extra-ocular muscles intact, sensation grossly intact.  HEENT: Normocephalic, atraumatic, pupils equal round reactive to light, neck supple, no masses, no lymphadenopathy, thyroid nonpalpable.  Skin: Warm and dry, no rashes. Cardiac:  no lower extremity edema. Respiratory: Not using accessory muscles, speaking in full  sentences. Abdominal: NT, soft Gait: Nonantlagic, good balance and coordination, patient does have significant shortening of the right leg compared to the contralateral side. Lymphatic: no lymphadenopathy in neck or axillae on palpation, non tender.  Musculoskeletal: Inspection and palpation of the right and left upper extremities including the shoulders elbows and wrist are unremarkable with full range of motion and good muscle strength and tone. Inspection and palpation of the right and left lower extremities including the hips knees and ankles are unremarkable and nontender with full range of motion and good muscle strength and tone and are symmetric. Back Exam:  Inspection: Unremarkable  Motion: Flexion 45 deg, Extension 45 deg, Side Bending to 45 deg bilaterally,  Rotation to 45 deg bilaterally  SLR laying:  Negative XSLR laying: Negative  Palpable tenderness:  Continued tenderness and tenderness of the Pierce musculature of the lumbar spine on the right side FABER: negative. Sensory change: Gross sensation intact to all lumbar and sacral dermatomes.  Reflexes: 2+ at both patellar tendons, 2+ at achilles tendons, Babinski's downgoing.  Strength at foot  Plantar-flexion: 5/5 Dorsi-flexion: 5/5 Eversion: 5/5 Inversion: 4/5  Leg strength  Quad: 5/5 Hamstring: 5/5 Hip flexor: 5/5 Hip abductors: 4/5  Gait unremarkable.   Osteopathic manipulation findings  Cervical   neutral Thoracic  T5 extended rotated and side bent left   Lumbar:  L2 flexed rotated and side bent right  L5 flexed rotated and side bent left   Sacrum  Left on left   Impression and Recommendations:

## 2015-02-06 NOTE — Progress Notes (Signed)
Pre visit review using our clinic review tool, if applicable. No additional management support is needed unless otherwise documented below in the visit note. 

## 2015-02-06 NOTE — Patient Instructions (Addendum)
Good to see you No jumping! Ice when you need it Continue the medicines Vitamin C 500mg  and iron 65mg  daily could help OK to do elliptical biking swimming any low impact exercise.  See me again in 6-8 weeks.

## 2015-02-06 NOTE — Assessment & Plan Note (Signed)
Patient did have radicular symptoms but seems to be resolving at this time. Continues to have some mild discomfort. Has had 2 epidurals previously. Continues to be able to run. Seems to give her more pain with more resting. Denies any fevers chills or any abnormal weight loss. Patient has tramadol for break through pain. Patient will continue her other medications and we will see her again in 4-6 weeks for further evaluation and treatment.

## 2015-02-13 ENCOUNTER — Ambulatory Visit (INDEPENDENT_AMBULATORY_CARE_PROVIDER_SITE_OTHER): Payer: Managed Care, Other (non HMO) | Admitting: Family Medicine

## 2015-02-13 ENCOUNTER — Encounter: Payer: Self-pay | Admitting: Family Medicine

## 2015-02-13 DIAGNOSIS — R269 Unspecified abnormalities of gait and mobility: Secondary | ICD-10-CM

## 2015-02-13 DIAGNOSIS — M545 Low back pain, unspecified: Secondary | ICD-10-CM

## 2015-02-13 NOTE — Patient Instructions (Signed)
Acclimating to your Igli orthotics -  ° °We recommend that you allow up to 2 weeks for you to fully acclimate to your new custom orthotics. Please use the following recommended plan to build into full day wear.  ° °Day 1 - 2hours/day °Every day afterwards add 1 hour of wear(3hrs/day, 4hrs/day, etc) until you are able to wear them for an entire day without issues.  ° °If you notice any irritation or increasing discomfort with your new orthotics, please do not hesitate in contacting the office(leave a message for Ernesto Lashway or Lindsay) or sending Tyrique Sporn a message through MyChart to arrange a time to review your fit.  ° °Enjoy your new orthotics!!  ° °Rachel Golden ° ° ° °

## 2015-02-13 NOTE — Progress Notes (Signed)
Patient was fitted for a : standard, cushioned, semi-rigid orthotic. The orthotic was heated and afterward the patient was in a seated position and the orthotic molded. The patient was positioned in subtalar neutral position and 10 degrees of ankle dorsiflexion in a non-weight bearing stance. After completion of molding, patient did have orthotic management which included instructions on acclimating to the orthotics, signs of ill fit as well as care for the orthotic.  I spent approximately with the patient properly fitting the orthotic, adjusting between shoes and discussing care.   The blank was ground to a stable position for weight bearing. Size: 8 (Igli Active)  Base: Carbon fiber Additional Posting and Padding: The following postings were fitted onto the molded orthotics to help maintain a talar neutral position - Wedge posting for transverse arch: None   Silicone posting for longitudinal arch: 250/120(ground down) 250/100 bilaterally  The patient ambulated these, and they were very comfortable and supportive.

## 2015-02-13 NOTE — Assessment & Plan Note (Signed)
Patient given foot orthotics today. Am hoping that this will help her lower back. Patient does have some mild change in her feet an antalgic gait. Patient come back and see me again in 4 weeks. Please see patient instructions.

## 2015-03-07 ENCOUNTER — Encounter: Payer: Self-pay | Admitting: Neurology

## 2015-03-07 ENCOUNTER — Ambulatory Visit (INDEPENDENT_AMBULATORY_CARE_PROVIDER_SITE_OTHER): Payer: Managed Care, Other (non HMO) | Admitting: Neurology

## 2015-03-07 VITALS — BP 110/74 | HR 75 | Wt 120.0 lb

## 2015-03-07 DIAGNOSIS — M62838 Other muscle spasm: Secondary | ICD-10-CM | POA: Diagnosis not present

## 2015-03-07 DIAGNOSIS — I773 Arterial fibromuscular dysplasia: Secondary | ICD-10-CM

## 2015-03-07 NOTE — Progress Notes (Signed)
Follow-up Visit   Date: 03/07/2015    Rachel Golden MRN: [id number] DOB: 07/23/1965   Interim History: Rachel Golden is a 49 y.o. right-handed Caucasian female with chronic back pain returning to the clinic for follow-up of fibromuscular dysplasia and stroke.  The patient was accompanied to the clinic by husband who also provides collateral information.    History of present illness: On May 13th, she was picking up a hanger off the floor and developed acute onset of dizziness.  She initially thought it was because she got up too quickly.  She then heard a "swooshing" sound in her head and developed a piercing headache.  She took advil which alleviated her headache. Swooshing sound resolved within 5 minutes.  Within 30-minutes, she developed right hand tingling and numbness of the lips.  She called 911 who evaluated her and did not feel that her symptoms were consistent with a stroke, so was not taken to the emergency department.  She also recalls having neck pain especially with neck rotation to the right.   On May 19th, she was getting out of the car and developed neck stiffness and heard a swooshing sound in her right ear with associated brief but severe headache.  She also has associated vision disturbance described as "kelidoscope" vision, dizzy, and nauseous. She had difficulty walking out of her car to her home, but was able to make it into her home slowly.  Symptoms lasted about 10-minutes.   She again went to see Dr. [name] on the same day who ordered MRI/A of her head.  Imaging shows small infarcts involving the right cerebellum and medial right parieto-occipital region. Vessel imaging was shows vascular irregularity involving bilateral vertebral arteries with ?right dissection, concerning for fibromuscular dysplasia but I do not appreciate this well on my review.  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  She was started on aspirin 337m and lipitor 245mdaily.  No personal or family  history of stroke.  No personal history of hypertension.  Of note, she has received cervical manipulation for stiffness this month, which alleviated her neck discomfort.  UPDATE 10/04/2014:  [facility] [place] a sensation of dizziness lasting for about 5 minutes while there.  There was no associated headache or whooshing sound.  The spells resolved and she has no had anything similar again. Her stroke work-up has been returning largely normal including echocardiogram, holter monitor, and vasculitis/hypercoagulable labs. She was very happy with her evaluation with Dr. NuKathyrn Sheriffho suggested that only if symptoms worsen or she develops new headache, would cerebral angiogram be indicated.    UPDATE 03/07/2015:  She is here for 5-31-monthllow-up visit and has no new complaints.  She is doing well, no episodes of dizziness or other neurological spells.  She continues to have low back pain and had two ESI.  She is getting massage therapy and asking if this would be okay to received on her neck.  She does not get skeletal manipulation.  Medications:  Current Outpatient Prescriptions on File Prior to Visit  Medication Sig Dispense Refill  . aspirin EC 81 MG tablet Take 1 tablet (81 mg total) by mouth daily.    . aMarland Kitchenorvastatin (LIPITOR) 10 MG tablet Take 1 tablet (10 mg total) by mouth daily. 30 tablet 5  . cyclobenzaprine (FLEXERIL) 5 MG tablet Take 0.5 tablets (2.5 mg total) by mouth at bedtime as needed for  muscle spasms. 15 tablet 3  . Ibuprofen-Famotidine 800-26.6 MG TABS Take 1 tablet by mouth 3 (three) times daily. 270 tablet 3  . traMADol (ULTRAM) 50 MG tablet Take 1 tablet (50 mg total) by mouth every 12 (twelve) hours as needed. 60 tablet 0  . ZORVOLEX 18 MG CAPS as needed.   1   No current facility-administered medications on file prior to visit.    Allergies: No Known Allergies  Review of Systems:  CONSTITUTIONAL: No  fevers, chills, night sweats, or weight loss.  EYES: No visual changes or eye pain ENT: No hearing changes.  No history of nose bleeds.   RESPIRATORY: No cough, wheezing and shortness of breath.   CARDIOVASCULAR: Negative for chest pain, and palpitations.   GI: Negative for abdominal discomfort, blood in stools or black stools.  No recent change in bowel habits.   GU:  No history of incontinence.   MUSCLOSKELETAL: No history of joint pain or swelling.  No myalgias.   SKIN: Negative for lesions, rash, and itching.   ENDOCRINE: Negative for cold or heat intolerance, polydipsia or goiter.   PSYCH:  No depression or anxiety symptoms.   NEURO: As Above.   Vital Signs:  BP 110/74 mmHg  Pulse 75  Wt 120 lb (54.432 kg)  SpO2 98%  LMP 06/03/2014 (Approximate)  Neurological Exam: MENTAL STATUS including orientation to time, place, person, recent and remote memory, attention span and concentration, language, and fund of knowledge is normal.  Speech is not dysarthric.  CRANIAL NERVES: No visual field defects. Pupils equal round and reactive to light.  Normal conjugate, extra-ocular eye movements in all directions of gaze.  No ptosis. Normal facial sensation.  Face is symmetric. Palate elevates symmetrically.  Tongue is midline.  MOTOR:  Motor strength is 5/5 in all extremities.  Tone is normal.    MSRs:  Reflexes are brisk and symmetric 2+/4 throughout.  SENSORY:  Intact to vibration.  COORDINATION/GAIT:  Normal finger-to- nose-finger.  Intact rapid alternating movements bilaterally.  Gait narrow based and stable. Tandem gait intact.  Data:  Labs 08/23/2014:  CRP <0.1, ESR 12, ANA neg, HbA1c 5.5, LDL 55, HDL 76, C3/C4 normal, ENA neg, ANA neg, RF neg, hypercoagulable panel negative (normal protein C and S activity, no Factor V leiden mutation, normal antithrombin III and anticardiolipin antibody)  Surface echocardiogram 08/31/2014:  normal, EF 60% no thrombus  08/30/2014  48-hr holter:   No  significant arrhythmias.  No atrial fib or etiology for an embolic neuro event.  Rare atrial and ventricular ectopy noted.  Predominant rhythm is sinus.     MRI brain 08/27/2014:  Subacute nonhemorrhagic small infarcts involving mid to inferior right cerebellum and medial aspect of the right parietal -occipital lobe.  No intracranial hemorrhage.  MRA head 08/27/2014:  Ectatic cavernous segment internal carotid artery bilaterally. Mild narrowing petrous segment of the internal carotid artery bilaterally. Middle cerebral mild to moderate branch vessel narrowing and irregularity bilaterally greater on the right.   1.6 x 1.6 mm bulge M1 segment right middle cerebral artery appears to be origin of a vessel rather than saccular aneurysm. Stability can be confirmed on follow-up.   Moderate narrowing at the junction of the A1 segment and A2 segment of the right anterior cerebral artery.  Ectatic vertebral arteries and basilar artery without high-grade stenosis.  Full extent of the left posterior inferior cerebellar artery not imaged on present exam. This is noted on the contrast-enhanced MR angiogram of the neck and appears  ectatic were proximal narrowing.  Nonvisualized left anterior inferior cerebellar artery.  Narrowing and irregularity superior cerebellar artery greater on the left.  Posterior cerebral artery branch vessel narrowing and irregularity greater on the left.   MRA NECK FINDINGS Ectatic pleated dilated appearance of the vertebral arteries bilaterally suggestive of fibromuscular dysplasia. With the patient's subacute right sided infarcts, subtle focal dissection with subsequent embolic thrombus cannot be entirely excluded although not detected on the current exam   No significant stenosis of either carotid bifurcation. Ectatic internal carotid artery cervical segment bilaterally may also reflect changes of fibromuscular dysplasia but without pleated appearance as noted involving the vertebral  arteries.    IMPRESSION/PLAN: 1.  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 [name] 2.  Cervical muscle strain  - Avoid high velocity neck/skeletal manipulation with her history of vertebral dissection  - Okay for soft tissue massage  Return to clinic as needed  The duration of this appointment visit was 20 minutes of face-to-face time with the patient.  Greater than 50% of this time was spent in counseling, explanation of diagnosis, planning of further management, and coordination of care.   Thank you for allowing me to participate in patient's care.  If I can answer any additional questions, I would be pleased to do so.    Sincerely,    Donika K. PaPosey ProntoDO

## 2015-03-07 NOTE — Patient Instructions (Addendum)
OK to discontinue atorvastatin Continue aspirin 81mg  daily Avoid high velocity neck manipulation.  Soft tissue massage is okay. Return to clinic as needed

## 2015-03-20 ENCOUNTER — Encounter: Payer: Self-pay | Admitting: Family Medicine

## 2015-03-20 ENCOUNTER — Ambulatory Visit (INDEPENDENT_AMBULATORY_CARE_PROVIDER_SITE_OTHER): Payer: Managed Care, Other (non HMO) | Admitting: Family Medicine

## 2015-03-20 VITALS — BP 110/72 | HR 82 | Ht 60.0 in | Wt 120.0 lb

## 2015-03-20 DIAGNOSIS — M542 Cervicalgia: Secondary | ICD-10-CM | POA: Diagnosis not present

## 2015-03-20 DIAGNOSIS — M9902 Segmental and somatic dysfunction of thoracic region: Secondary | ICD-10-CM

## 2015-03-20 DIAGNOSIS — M9903 Segmental and somatic dysfunction of lumbar region: Secondary | ICD-10-CM | POA: Diagnosis not present

## 2015-03-20 DIAGNOSIS — M9904 Segmental and somatic dysfunction of sacral region: Secondary | ICD-10-CM

## 2015-03-20 DIAGNOSIS — M999 Biomechanical lesion, unspecified: Secondary | ICD-10-CM

## 2015-03-20 NOTE — Progress Notes (Signed)
  Subjective:    CC: Low back pain followup  HPI: Patient is a very pleasant 49 year old established patient of mine coming in for manipulation therapy. Had been doing very well but has had increasing muscle stiffness and tightness recently. Has had some difficulty with her lower back. Not doing the exercises as regularly. Does feel that some of it seems to be more of a stress related tightness. Patient has had a lot of anxiety during the holiday season. Patient did see neurology and they did stopped the statin with patient being well-controlled with her fibromuscular dysplasia  Past medical history, Surgical history, Family history not pertinant except as noted below, Social history, Allergies, and medications have been entered into the medical record, reviewed, and no changes needed.   Review of Systems: No fevers, chills, night sweats, weight loss, chest pain, or shortness of breath. Patient also denies any abdominal pain, any diarrhea or constipation, any nausea or vomiting.  Objective:   Blood pressure 110/72, pulse 82, height 5' (1.524 m), weight 120 lb (54.432 kg), last menstrual period 06/03/2014, SpO2 95 %.  General: Well Developed, well nourished, and in no acute distress.  Neuro: Alert and oriented x3, extra-ocular muscles intact, sensation grossly intact.  HEENT: Normocephalic, atraumatic, pupils equal round reactive to light, neck supple, no masses, no lymphadenopathy, thyroid nonpalpable.  Skin: Warm and dry, no rashes. Cardiac:  no lower extremity edema. Respiratory: Not using accessory muscles, speaking in full sentences. Abdominal: NT, soft Gait: Nonantlagic, good balance and coordination, patient does have significant shortening of the right leg compared to the contralateral side. Lymphatic: no lymphadenopathy in neck or axillae on palpation, non tender.  Musculoskeletal: Inspection and palpation of the right and left upper extremities including the shoulders elbows and wrist  are unremarkable with full range of motion and good muscle strength and tone. Inspection and palpation of the right and left lower extremities including the hips knees and ankles are unremarkable and nontender with full range of motion and good muscle strength and tone and are symmetric. Back Exam:  Inspection: Unremarkable  Motion: Flexion 45 deg, Extension 15 deg increased this from previous exam, Side Bending to 45 deg bilaterally,  Rotation to 45 deg bilaterally  SLR laying:  Negative XSLR laying: Negative  Palpable tenderness:  Continued tenderness and tenderness of the Pierce musculature of the lumbar spine on the right side FABER: negative. Sensory change: Gross sensation intact to all lumbar and sacral dermatomes.  Reflexes: 2+ at both patellar tendons, 2+ at achilles tendons, Babinski's downgoing.  Strength at foot  Plantar-flexion: 5/5 Dorsi-flexion: 5/5 Eversion: 5/5 Inversion: 4/5  Leg strength  Quad: 5/5 Hamstring: 5/5 Hip flexor: 5/5 Hip abductors: 4/5  Gait unremarkable.   Osteopathic manipulation findings  Cervical  Will not touch secondary to patient's history of fibro dysplasia Thoracic  T5 extended rotated and side bent left   Lumbar:  L2 flexed rotated and side bent right  L5 flexed rotated and side bent left   Sacrum  Left on left   Impression and Recommendations:

## 2015-03-20 NOTE — Assessment & Plan Note (Signed)
Discussed with patient we will only do some mild myofascial release on the neck. No high velocity anymore. Discussed with patient still of strength in the upper back. The most beneficial. Patient is no longer having any lumbar radiculopathy. Patient seems to be doing relatively okay. Does seem a little more stress. Patient will make some mild adjustments to ergonomics throughout the day and patient will come back in 4-6 weeks for further evaluation and treatment.

## 2015-03-20 NOTE — Assessment & Plan Note (Signed)
Decision today to treat with OMT was based on Physical Exam  After verbal consent patient was treated with HVLA, ME techniques in , thoracic lumbar and sacral areas and only indirect technique on cervical spine  Patient tolerated the procedure well with improvement in symptoms  Patient given exercises, stretches and lifestyle modifications  See medications in patient instructions if given  Patient will follow up in 4-6 weeks                      

## 2015-03-20 NOTE — Progress Notes (Signed)
Pre visit review using our clinic review tool, if applicable. No additional management support is needed unless otherwise documented below in the visit note. 

## 2015-03-20 NOTE — Patient Instructions (Signed)
Good to see you Ice is your friend Stay active Just tighter Stretch  Hydrocortisone nightly for 2 weeks  Get back to being active See me again in 6 weeks.

## 2015-04-29 ENCOUNTER — Ambulatory Visit (INDEPENDENT_AMBULATORY_CARE_PROVIDER_SITE_OTHER): Payer: Managed Care, Other (non HMO) | Admitting: Neurology

## 2015-04-29 ENCOUNTER — Encounter: Payer: Self-pay | Admitting: Neurology

## 2015-04-29 VITALS — BP 100/68 | HR 89 | Ht 60.0 in | Wt 120.2 lb

## 2015-04-29 DIAGNOSIS — M62838 Other muscle spasm: Secondary | ICD-10-CM

## 2015-04-29 DIAGNOSIS — H9311 Tinnitus, right ear: Secondary | ICD-10-CM | POA: Diagnosis not present

## 2015-04-29 DIAGNOSIS — I773 Arterial fibromuscular dysplasia: Secondary | ICD-10-CM

## 2015-04-29 DIAGNOSIS — R42 Dizziness and giddiness: Secondary | ICD-10-CM | POA: Diagnosis not present

## 2015-04-29 DIAGNOSIS — R292 Abnormal reflex: Secondary | ICD-10-CM

## 2015-04-29 NOTE — Patient Instructions (Addendum)
1.  MRA head and neck 2.  Start flexeril  at bedtime as needed for muscle spasm 3.  We will call you with the results of the imaging

## 2015-04-29 NOTE — Progress Notes (Signed)
Follow-up Visit   Date: 04/29/2015    Rachel Golden MRN: [id number] DOB: 1965/09/07   Interim History: Rachel Golden is a 50 y.o. right-handed Caucasian female with chronic back pain returning to the clinic with new complaints of right neck pain, imbalance, and tinnutis.  She was previously seen in the office for fibromuscular dysplasia and stroke.  The patient was accompanied to the clinic by husband who also provides collateral information.    History of present illness: On May 13th 2016, she was picking up a hanger off the floor and developed acute onset of dizziness.  She initially thought it was because she got up too quickly.  She then heard a "swooshing" sound in her head and developed a piercing headache.  She took advil which alleviated her headache. Swooshing sound resolved within 5 minutes.  Within 30-minutes, she developed right hand tingling and numbness of the lips.  She called 911 who evaluated her and did not feel that her symptoms were consistent with a stroke, so was not taken to the emergency department.  She also recalls having neck pain especially with neck rotation to the right.   On May 19th, she was getting out of the car and developed neck stiffness and heard a swooshing sound in her right ear with associated brief but severe headache.  She also has associated vision disturbance described as "kelidoscope" vision, dizzy, and nauseous. She had difficulty walking out of her car to her home, but was able to make it into her home slowly.  Symptoms lasted about 10-minutes.   She again went to see Dr. [name] on the same day who ordered MRI/A of her head.  Imaging shows small infarcts involving the right cerebellum and medial right parieto-occipital region. Vessel imaging was shows vascular irregularity involving bilateral vertebral arteries with ?right dissection, concerning for fibromuscular dysplasia but I do not appreciate this well on my review.  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  She was started on aspirin 360m and lipitor 247mdaily.  No personal or family history of stroke.  No personal history of hypertension.  Of note, she has received cervical manipulation for stiffness this month, which alleviated her neck discomfort.  UPDATE 10/04/2014:  [facility] [place] having a sensation of dizziness lasting for about 5 minutes while there.  There was no associated headache or whooshing sound.  The spells resolved and she has no had anything similar again. Her stroke work-up has been returning largely normal including echocardiogram, holter monitor, and vasculitis/hypercoagulable labs. She was very happy with her evaluation with Dr. NuKathyrn Sheriffho suggested that only if symptoms worsen or she develops new headache, would cerebral angiogram be indicated.    UPDATE 03/07/2015:  She is here for 5-12-monthllow-up visit and has no new complaints.  She is doing well, no episodes of dizziness or other neurological spells.  She continues to have low back pain and had two ESI.  She is getting massage therapy and asking if this would be okay to received on her neck.  She does not get skeletal manipulation.  UPDATE 04/29/2015:  Starting in December, she began experiencing high-pitched sound in the right ear, neck burning, and imbalance.  Neck discomfort is constant and involves the right side, which is worse when at rest and improved with activity. Heat and cold compresses alleviates her discomfort.  She has not tried NSAIDs or tylenol.  She has noticed the high-pitched sound and imbalance about 4-5 times.  Denies headaches, changes in vision, weakness, numbness/tingling.   Symptoms are triggered by head movement.    She endorses a significant amount of stress related to her father passing in August and having to manage her mother.   Medications:  Current Outpatient Prescriptions on File Prior to Visit  Medication  Sig Dispense Refill  . aspirin EC 81 MG tablet Take 1 tablet (81 mg total) by mouth daily.    . cyclobenzaprine (FLEXERIL) 5 MG tablet Take 0.5 tablets (2.5 mg total) by mouth at bedtime as needed for muscle spasms. 15 tablet 3  . traMADol (ULTRAM) 50 MG tablet Take 1 tablet (50 mg total) by mouth every 12 (twelve) hours as needed. 60 tablet 0  . ZORVOLEX 18 MG CAPS as needed.   1   No current facility-administered medications on file prior to visit.    Allergies: No Known Allergies  Review of Systems:  CONSTITUTIONAL: No fevers, chills, night sweats, or weight loss.  EYES: No visual changes or eye pain ENT: No hearing changes.  No history of nose bleeds.   RESPIRATORY: No cough, wheezing and shortness of breath.   CARDIOVASCULAR: Negative for chest pain, and palpitations.   GI: Negative for abdominal discomfort, blood in stools or black stools.  No recent change in bowel habits.   GU:  No history of incontinence.   MUSCLOSKELETAL: No history of joint pain or swelling.  No myalgias.   SKIN: Negative for lesions, rash, and itching.   ENDOCRINE: Negative for cold or heat intolerance, polydipsia or goiter.   PSYCH:  No depression or anxiety symptoms.   NEURO: As Above.   Vital Signs:  BP 100/68 mmHg  Pulse 89  Ht 5' (1.524 m)  Wt 120 lb 3 oz (54.517 kg)  BMI 23.47 kg/m2  SpO2 98%  LMP 06/03/2014 (Approximate)  General:  Well appearing, comfortable CV:  Regular rate and rhythm, no carotid bruit Pulm:  Clear to auscultation  Neurological Exam: MENTAL STATUS including orientation to time, place, person, recent and remote memory, attention span and concentration, language, and fund of knowledge is normal.  Speech is not dysarthric.  CRANIAL NERVES: Normal fundoscopic exam bilaterally. No visual field defects. Pupils equal round and reactive to light. No Horner's syndrome.  Normal conjugate, extra-ocular eye movements in all directions of gaze.  No ptosis. Normal facial sensation.   Face is symmetric. Palate elevates symmetrically.  Tongue is midline.  MOTOR:  Motor strength is 5/5 in all extremities.  Tone is normal.  There is increased muscle tension and tenderness over the right levator scapular and cervical paraspinal muscles.  MSRs:  Reflexes are brisk and symmetric 3+/4 throughout.  Plantars are downgoing.  SENSORY:  Intact to vibration.  COORDINATION/GAIT:  Normal finger-to- nose-finger.  Intact rapid alternating movements bilaterally.  Gait narrow based and stable. Tandem gait intact.  Data:  Labs 08/23/2014:  CRP <0.1, ESR 12, ANA neg, HbA1c 5.5, LDL 55, HDL 76, C3/C4 normal, ENA neg, ANA neg, RF neg, hypercoagulable panel negative (normal protein C and S activity, no Factor V leiden mutation, normal antithrombin III and anticardiolipin antibody)  Surface echocardiogram 08/31/2014:  normal, EF 60% no thrombus  08/30/2014  48-hr holter:   No significant arrhythmias.  No atrial fib or etiology for an embolic neuro event.  Rare atrial and ventricular ectopy noted.  Predominant rhythm is sinus.     MRI brain 08/27/2014:  Subacute nonhemorrhagic small infarcts  involving mid to inferior right cerebellum and medial aspect of the right parietal -occipital lobe.  No intracranial hemorrhage.  MRA head 08/27/2014:  Ectatic cavernous segment internal carotid artery bilaterally. Mild narrowing petrous segment of the internal carotid artery bilaterally. Middle cerebral mild to moderate branch vessel narrowing and irregularity bilaterally greater on the right.   1.6 x 1.6 mm bulge M1 segment right middle cerebral artery appears to be origin of a vessel rather than saccular aneurysm. Stability can be confirmed on follow-up.   Moderate narrowing at the junction of the A1 segment and A2 segment of the right anterior cerebral artery.  Ectatic vertebral arteries and basilar artery without high-grade stenosis.  Full extent of the left posterior inferior cerebellar artery not imaged on  present exam. This is noted on the contrast-enhanced MR angiogram of the neck and appears ectatic were proximal narrowing.  Nonvisualized left anterior inferior cerebellar artery.  Narrowing and irregularity superior cerebellar artery greater on the left.  Posterior cerebral artery branch vessel narrowing and irregularity greater on the left.   MRA NECK FINDINGS Ectatic pleated dilated appearance of the vertebral arteries bilaterally suggestive of fibromuscular dysplasia. With the patient's subacute right sided infarcts, subtle focal dissection with subsequent embolic thrombus cannot be entirely excluded although not detected on the current exam   No significant stenosis of either carotid bifurcation. Ectatic internal carotid artery cervical segment bilaterally may also reflect changes of fibromuscular dysplasia but without pleated appearance as noted involving the vertebral arteries.    IMPRESSION/PLAN: 1.  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 Etiology is most likely embolic from possible right vertebral dissection that is seen - Continue aspirin to 58m daily - If her MRA shows interval changes, proceed with cerebral angriogram (previously seen by Dr. NKathyrn Sheriff  2.  Cervical muscle strain - Start flexeril 516mat bedtime as needed - Avoid high velocity neck/skeletal manipulation with her history of vertebral dissection - Okay for soft tissue massage  Follow-up after above testing  The duration of this appointment visit was 25 minutes of face-to-face time with the patient.  Greater than 50% of this time was  spent in counseling, explanation of diagnosis, planning of further management, and coordination of care.   Thank you for allowing me to participate in patient's care.  If I can answer any additional questions, I would be pleased to do so.    Sincerely,    Donika K. PaPosey ProntoDO

## 2015-04-30 ENCOUNTER — Ambulatory Visit: Payer: Managed Care, Other (non HMO) | Admitting: Family Medicine

## 2015-05-03 ENCOUNTER — Ambulatory Visit
Admission: RE | Admit: 2015-05-03 | Discharge: 2015-05-03 | Disposition: A | Discharge status: Home / Self Care | Payer: Managed Care, Other (non HMO) | Source: Ambulatory Visit | Attending: Neurology | Admitting: Neurology

## 2015-05-03 DIAGNOSIS — R42 Dizziness and giddiness: Secondary | ICD-10-CM

## 2015-05-03 DIAGNOSIS — I773 Arterial fibromuscular dysplasia: Secondary | ICD-10-CM

## 2015-05-03 DIAGNOSIS — H9311 Tinnitus, right ear: Secondary | ICD-10-CM

## 2015-05-03 DIAGNOSIS — M62838 Other muscle spasm: Secondary | ICD-10-CM

## 2015-05-03 DIAGNOSIS — R292 Abnormal reflex: Secondary | ICD-10-CM

## 2015-05-03 MED ORDER — GADOBENATE DIMEGLUMINE 529 MG/ML IV SOLN
10.0000 mL | Freq: Once | INTRAVENOUS | Status: AC | PRN
  Administered 2015-05-03: 10 mL via INTRAVENOUS

## 2015-06-28 ENCOUNTER — Telehealth: Payer: Self-pay | Admitting: Family Medicine

## 2015-06-28 NOTE — Telephone Encounter (Signed)
Pt has done something to her neck and is in pain. She is wondering if you can get her in anytime on Tuesday or Weds Her best number is [phone]

## 2015-07-01 NOTE — Telephone Encounter (Signed)
lmovm to see if pt can come in tomorrow @ 115p.

## 2015-07-01 NOTE — Telephone Encounter (Signed)
Pt called back & confirmed appt for tomorrow at 115p.

## 2015-07-02 ENCOUNTER — Encounter: Payer: Self-pay | Admitting: Family Medicine

## 2015-07-02 ENCOUNTER — Ambulatory Visit (INDEPENDENT_AMBULATORY_CARE_PROVIDER_SITE_OTHER): Payer: Managed Care, Other (non HMO) | Admitting: Family Medicine

## 2015-07-02 VITALS — BP 108/82 | HR 81 | Ht 60.0 in | Wt 120.0 lb

## 2015-07-02 DIAGNOSIS — M9902 Segmental and somatic dysfunction of thoracic region: Secondary | ICD-10-CM | POA: Diagnosis not present

## 2015-07-02 DIAGNOSIS — M25512 Pain in left shoulder: Secondary | ICD-10-CM | POA: Diagnosis not present

## 2015-07-02 DIAGNOSIS — M9904 Segmental and somatic dysfunction of sacral region: Secondary | ICD-10-CM | POA: Diagnosis not present

## 2015-07-02 DIAGNOSIS — M542 Cervicalgia: Secondary | ICD-10-CM | POA: Diagnosis not present

## 2015-07-02 DIAGNOSIS — M9903 Segmental and somatic dysfunction of lumbar region: Secondary | ICD-10-CM

## 2015-07-02 DIAGNOSIS — M999 Biomechanical lesion, unspecified: Secondary | ICD-10-CM

## 2015-07-02 MED ORDER — GABAPENTIN 100 MG PO CAPS: 200.0000 mg | ORAL_CAPSULE | Freq: Every day | ORAL | Status: DC

## 2015-07-02 NOTE — Progress Notes (Signed)
Pre visit review using our clinic review tool, if applicable. No additional management support is needed unless otherwise documented below in the visit note. 

## 2015-07-02 NOTE — Progress Notes (Signed)
Subjective:    [id number] neck and shoulder pain  HPI: Patient is a very pleasant individual who is coming back for neck and left shoulder pain. Has been running much more frequent. Ran 9 miles this weekend and was having pain on the shoulder. Does have a past medical history significant for fibromuscular dysplasia but denies any weakness, numbness, or any headache associated with it. Patient states that when she was stretching she felt like she had a twinge that might have caused it. She rates the severity of pain a 7 out of 10. States that it is affecting even when she is trying to rest at night. Patient overall states that it seems to be more of a tightness of the shoulder blade can truly her neck. Would not stay that it is radiating down her arm.  Past Medical History  Diagnosis Date  . Chronic back pain    Past Surgical History  Procedure Laterality Date  . Nasal septum surgery     Social History  Substance Use Topics  . Smoking status: Never Smoker   . Smokeless tobacco: Never Used  . Alcohol Use: 0.0 oz/week    0 Standard drinks or equivalent per week     Comment: 3-4 drinks per week, on occasion   No Known Allergies Family History  Problem Relation Age of Onset  . Heart murmur Mother     Living, 91  . Alzheimer's disease Father     Living, 37  . Vascular Disease Brother   . Healthy Son   . Healthy Daughter      Past medical history, Surgical history, Family history not pertinant except as noted below, Social history, Allergies, and medications have been entered into the medical record, reviewed, and no changes needed.   Review of Systems: No fevers, chills, night sweats, weight loss, chest pain, or shortness of breath. Patient also denies any abdominal pain, any diarrhea or constipation, any nausea or vomiting.  Objective:   Blood pressure 108/82, pulse 81, height 5' (1.524 m), weight 120 lb (54.432 kg), last menstrual period 06/03/2014, SpO2 96 %.  General: Well  Developed, well nourished, and in no acute distress.  Neuro: Alert and oriented x3, extra-ocular muscles intact, sensation grossly intact.  HEENT: Normocephalic, atraumatic, pupils equal round reactive to light, neck supple, no masses, no lymphadenopathy, thyroid nonpalpable.  Skin: Warm and dry, no rashes. Cardiac:  no lower extremity edema. Respiratory: Not using accessory muscles, speaking in full sentences. Abdominal: NT, soft Gait: Nonantlagic, good balance and coordination, patient does have significant shortening of the right leg compared to the contralateral side. Lymphatic: no lymphadenopathy in neck or axillae on palpation, non tender.  Musculoskeletal: Inspection and palpation of the right and left upper extremities including the shoulders elbows and wrist are unremarkable with full range of motion and good muscle strength and tone. Inspection and palpation of the right and left lower extremities including the hips knees and ankles are unremarkable and nontender with full range of motion and good muscle strength and tone and are symmetric. Patient's left shoulder shows that she does have trigger points in the scapular region. Patient has full range of motion of the shoulder with no impingement. Neurovascular intact distally. Negative Spurling's maneuver with her neck Back Exam:  Inspection: Unremarkable  Motion: Flexion 45 deg, Extension 20 which is an improvement Side Bending to 45 deg bilaterally,  Rotation to 45 deg bilaterally  SLR laying:  Negative XSLR laying: Negative  Palpable tenderness:  Mild tightness of  the lumbar paraspinal musculature FABER: negative. Sensory change: Gross sensation intact to all lumbar and sacral dermatomes.  Reflexes: 2+ at both patellar tendons, 2+ at achilles tendons, Babinski's downgoing.  Strength at foot  Plantar-flexion: 5/5 Dorsi-flexion: 5/5 Eversion: 5/5 Inversion: 4/5  Leg strength  Quad: 5/5 Hamstring: 5/5 Hip flexor: 5/5 Hip  abductors: 4/5  Gait unremarkable.   Osteopathic manipulation findings  Cervical  Will not touch secondary to patient's history of fibro dysplasia Thoracic  T3 extended rotated and side bent right T5 extended rotated and side bent left   Lumbar:  L2 flexed rotated and side bent right  L5 flexed rotated and side bent left   Sacrum  Left on left   After verbal consent patient was prepped with alcohol swab and with a 25-gauge 1 inch no was injected in for different trigger points into the left shoulder. Patient tolerated the procedure well. Minimal blood loss. Pain significant improvement. No trouble breathing.  Impression and Recommendations:

## 2015-07-02 NOTE — Assessment & Plan Note (Signed)
Patient was given an injection today. Rachel Golden this will be beneficial. For different trigger points were injected. I do think that there is a possibility for cervical radiculopathy patient was also started on gabapentin at a low dose. Patient has a muscle relaxer. We discussed doing more postural exercises. Patient will avoid significant running but can continue 3 times a week. Patient and will come back and see me again in 10-14 days to make sure make improvement. Imaging of the possible nerves may be necessary.

## 2015-07-02 NOTE — Assessment & Plan Note (Signed)
Possible radiculopathy noted. We discussed icing regimen. Discuss home exercise. Patient come back and see me again in 10-14 days. Gabapentin prescribed

## 2015-07-02 NOTE — Assessment & Plan Note (Signed)
Decision today to treat with OMT was based on Physical Exam  After verbal consent patient was treated with HVLA, ME techniques in , thoracic lumbar and sacral areas and only indirect technique on cervical spine  Patient tolerated the procedure well with improvement in symptoms  Patient given exercises, stretches and lifestyle modifications  See medications in patient instructions if given  Patient will follow up in 2 weeks

## 2015-07-02 NOTE — Patient Instructions (Signed)
Good to se eyou  Only run 3 times a week  Ice 20 minutes 2 times daily. Usually after activity and before bed. Gabapentin 100mg  at night for first 5 nights then 200mg  nightly thereafter Injected the area today  See me again in 10-14 days to make sure you are doing well.

## 2015-07-16 ENCOUNTER — Ambulatory Visit: Payer: Managed Care, Other (non HMO) | Admitting: Family Medicine

## 2015-07-31 ENCOUNTER — Encounter: Payer: Self-pay | Admitting: Family Medicine

## 2015-07-31 ENCOUNTER — Ambulatory Visit (INDEPENDENT_AMBULATORY_CARE_PROVIDER_SITE_OTHER): Payer: Managed Care, Other (non HMO) | Admitting: Family Medicine

## 2015-07-31 VITALS — BP 108/72 | HR 69 | Ht 60.0 in | Wt 118.0 lb

## 2015-07-31 DIAGNOSIS — M9903 Segmental and somatic dysfunction of lumbar region: Secondary | ICD-10-CM

## 2015-07-31 DIAGNOSIS — M545 Low back pain, unspecified: Secondary | ICD-10-CM

## 2015-07-31 DIAGNOSIS — M9904 Segmental and somatic dysfunction of sacral region: Secondary | ICD-10-CM

## 2015-07-31 DIAGNOSIS — M9902 Segmental and somatic dysfunction of thoracic region: Secondary | ICD-10-CM

## 2015-07-31 DIAGNOSIS — M999 Biomechanical lesion, unspecified: Secondary | ICD-10-CM

## 2015-07-31 NOTE — Assessment & Plan Note (Signed)
Decision today to treat with OMT was based on Physical Exam  After verbal consent patient was treated with HVLA, ME techniques in , thoracic lumbar and sacral areas and only indirect technique on cervical spine  Patient tolerated the procedure well with improvement in symptoms  Patient given exercises, stretches and lifestyle modifications  See medications in patient instructions if given  Patient will follow up in 6-8 weeks

## 2015-07-31 NOTE — Progress Notes (Signed)
Pre visit review using our clinic review tool, if applicable. No additional management support is needed unless otherwise documented below in the visit note. 

## 2015-07-31 NOTE — Progress Notes (Signed)
Subjective:    KG:MWNUU neck and shoulder pain f/u  HPI: Patient is a very pleasant individual who is coming back for neck and left shoulder pain. Patient was seen recently was having more of an exacerbation. Seems to have more of a muscle spasm. Did respond very well to trigger point injections. Is feeling better overall. States that she's been exercising more frequently and this is helped out significantly. Working on the course strength. Going to start training for a half marathon. Overall happy with the results.  Past Medical History  Diagnosis Date  . Chronic back pain    Past Surgical History  Procedure Laterality Date  . Nasal septum surgery     Social History  Substance Use Topics  . Smoking status: Never Smoker   . Smokeless tobacco: Never Used  . Alcohol Use: 0.0 oz/week    0 Standard drinks or equivalent per week     Comment: 3-4 drinks per week, on occasion   No Known Allergies Family History  Problem Relation Age of Onset  . Heart murmur Mother     Living, 16  . Alzheimer's disease Father     Living, 25  . Vascular Disease Brother   . Healthy Son   . Healthy Daughter      Past medical history, Surgical history, Family history not pertinant except as noted below, Social history, Allergies, and medications have been entered into the medical record, reviewed, and no changes needed.   Review of Systems: No fevers, chills, night sweats, weight loss, chest pain, or shortness of breath. Patient also denies any abdominal pain, any diarrhea or constipation, any nausea or vomiting.  Objective:   Blood pressure 108/72, pulse 69, height 5' (1.524 m), weight 118 lb (53.524 kg), last menstrual period 06/03/2014, SpO2 98 %.  General: Well Developed, well nourished, and in no acute distress.  Neuro: Alert and oriented x3, extra-ocular muscles intact, sensation grossly intact.  HEENT: Normocephalic, atraumatic, pupils equal round reactive to light, neck supple, no masses,  no lymphadenopathy, thyroid nonpalpable.  Skin: Warm and dry, no rashes. Cardiac:  no lower extremity edema. Respiratory: Not using accessory muscles, speaking in full sentences. Abdominal: NT, soft Gait: Nonantlagic, good balance and coordination, patient does have significant shortening of the right leg compared to the contralateral side. Lymphatic: no lymphadenopathy in neck or axillae on palpation, non tender.  Musculoskeletal: Inspection and palpation of the right and left upper extremities including the shoulders elbows and wrist are unremarkable with full range of motion and good muscle strength and tone. Inspection and palpation of the right and left lower extremities including the hips knees and ankles are unremarkable and nontender with full range of motion and good muscle strength and tone and are symmetric. . Negative Spurling's maneuver with her neck Back Exam:  Inspection: Unremarkable  Motion: Flexion 45 deg, Extension 20 which is an improvement Side Bending to 45 deg bilaterally,  Rotation to 45 deg bilaterally  SLR laying:  Negative XSLR laying: Negative  Palpable tenderness:  Less tightness and previous exam FABER: negative. Sensory change: Gross sensation intact to all lumbar and sacral dermatomes.  Reflexes: 2+ at both patellar tendons, 2+ at achilles tendons, Babinski's downgoing.  Strength at foot  Plantar-flexion: 5/5 Dorsi-flexion: 5/5 Eversion: 5/5 Inversion: 4/5  Leg strength  Quad: 5/5 Hamstring: 5/5 Hip flexor: 5/5 Hip abductors: 4+/5  Gait unremarkable.   Osteopathic manipulation findings  Cervical  Will not touch secondary to patient's history of fibro dysplasia Thoracic  T3  extended rotated and side bent right T7 extended rotated and side bent left   Lumbar:  L2 flexed rotated and side bent right  L5 flexed rotated and side bent left   Sacrum  Right on right Back to patient's standard pattern   Impression and Recommendations:

## 2015-07-31 NOTE — Patient Instructions (Signed)
Good to see you You are doing much better Ice when you need it Keep the stretching going As long as your HR comes down 15 beats in a minute you are fine See me again in 6-8 weeks.

## 2015-07-31 NOTE — Assessment & Plan Note (Signed)
Overall patient is doing much better at this time. We discussed with patient about increasing her exercises and doing more of an exercise prescription. We will give patient a running progression with her starting to train for the half marathon. Patient will come back and see me again in 6-8 weeks before the race.

## 2015-08-28 ENCOUNTER — Ambulatory Visit: Payer: Managed Care, Other (non HMO) | Admitting: Family Medicine

## 2015-08-28 ENCOUNTER — Encounter: Payer: Self-pay | Admitting: Family Medicine

## 2015-08-28 ENCOUNTER — Ambulatory Visit (INDEPENDENT_AMBULATORY_CARE_PROVIDER_SITE_OTHER): Payer: Managed Care, Other (non HMO) | Admitting: Family Medicine

## 2015-08-28 VITALS — BP 120/80 | HR 73 | Ht 60.0 in | Wt 118.0 lb

## 2015-08-28 DIAGNOSIS — M999 Biomechanical lesion, unspecified: Secondary | ICD-10-CM

## 2015-08-28 DIAGNOSIS — M542 Cervicalgia: Secondary | ICD-10-CM

## 2015-08-28 DIAGNOSIS — M545 Low back pain, unspecified: Secondary | ICD-10-CM

## 2015-08-28 DIAGNOSIS — M9904 Segmental and somatic dysfunction of sacral region: Secondary | ICD-10-CM | POA: Diagnosis not present

## 2015-08-28 DIAGNOSIS — M9902 Segmental and somatic dysfunction of thoracic region: Secondary | ICD-10-CM

## 2015-08-28 DIAGNOSIS — M9903 Segmental and somatic dysfunction of lumbar region: Secondary | ICD-10-CM

## 2015-08-28 NOTE — Assessment & Plan Note (Signed)
Mild increasing tightness today. We discussed continuing to work on hip flexors. Patient did respond well to osteopathic manipulation. Some mild increasing upper back pain but none on the trigger points. Patient is also having some neck pain. We will continue to monitor. Discussed self manipulation techniques. Mild soft tissue manipulation done today and the wound stay away from any high velocity. Return to clinic in 4 weeks.

## 2015-08-28 NOTE — Patient Instructions (Addendum)
Good to see you  For the shoulder try ice Ice 20 minutes 2 times daily. Usually after activity and before bed. Stay active but keep hands within peripheral vison  pennsaid pinkie amount topically 2 times daily as needed.  For the neck  Try 2 tennis balls in a tube sock and place them at the base of the skull where the head meets the neck and just lay on them.  Vitamin D 2000 iU daily  See me again in 3 weeks.

## 2015-08-28 NOTE — Assessment & Plan Note (Signed)
Decision today to treat with OMT was based on Physical Exam  After verbal consent patient was treated with HVLA, ME techniques in , thoracic lumbar and sacral areas and only indirect technique on cervical spine  Patient tolerated the procedure well with improvement in symptoms  Patient given exercises, stretches and lifestyle modifications  See medications in patient instructions if given  Patient will follow up in 3-4 weeks                  

## 2015-08-28 NOTE — Progress Notes (Signed)
Subjective:    ZO:XWRUE neck and shoulder pain f/u  HPI: Patient is a very pleasant individual who is coming back for neck and left shoulder pain. Patient was seen recently was having more of an exacerbation. States that the shoulder is not as bad as what it was previously with the trigger points but states that certain movements and give her a sharp pain. Describes it as a dull, throbbing aching sensation. Nothing that seems to radiate down the arm. Patient denies any association with her neck truly at this moment. Neck pain is little worse. More on the right side. Does not feel that it is associated with her fibromuscular dysplasia. No headache associated with it or any radiation of pain. No visual changes. States this seems to be very localized. Patient is training for a race in the near future and thinks the increasing activity could be contributing.  Past Medical History  Diagnosis Date  . Chronic back pain    Past Surgical History  Procedure Laterality Date  . Nasal septum surgery     Social History  Substance Use Topics  . Smoking status: Never Smoker   . Smokeless tobacco: Never Used  . Alcohol Use: 0.0 oz/week    0 Standard drinks or equivalent per week     Comment: 3-4 drinks per week, on occasion   No Known Allergies Family History  Problem Relation Age of Onset  . Heart murmur Mother     Living, 81  . Alzheimer's disease Father     Living, 63  . Vascular Disease Brother   . Healthy Son   . Healthy Daughter      Past medical history, Surgical history, Family history not pertinant except as noted below, Social history, Allergies, and medications have been entered into the medical record, reviewed, and no changes needed.   Review of Systems: No fevers, chills, night sweats, weight loss, chest pain, or shortness of breath. Patient also denies any abdominal pain, any diarrhea or constipation, any nausea or vomiting.  Objective:   Last menstrual period  06/03/2014.  General: Well Developed, well nourished, and in no acute distress.  Neuro: Alert and oriented x3, extra-ocular muscles intact, sensation grossly intact.  HEENT: Normocephalic, atraumatic, pupils equal round reactive to light, neck supple, no masses, no lymphadenopathy, thyroid nonpalpable.  Skin: Warm and dry, no rashes. Cardiac:  no lower extremity edema. Respiratory: Not using accessory muscles, speaking in full sentences. Abdominal: NT, soft Gait: Nonantlagic, good balance and coordination, patient does have significant shortening of the right leg compared to the contralateral side. Lymphatic: no lymphadenopathy in neck or axillae on palpation, non tender.  Musculoskeletal: Inspection and palpation of the right and left upper extremities including the shoulders elbows and wrist are unremarkable with full range of motion and good muscle strength and tone. Inspection and palpation of the right and left lower extremities including the hips knees and ankles are unremarkable and nontender with full range of motion and good muscle strength and tone and are symmetric. Marland Kitchen Neck: Inspection unremarkable. No palpable stepoffs. Negative Spurling's maneuver. Full neck range of motion did not does have pain on the right side over C4-C5 area. Grip strength and sensation normal in bilateral hands Strength good C4 to T1 distribution No sensory change to C4 to T1 Negative Hoffman sign bilaterally Reflexes normal  Back Exam:  Inspection: Unremarkable  Motion: Flexion 45 deg, Extension 25 which is an improvement Side Bending to 45 deg bilaterally,  Rotation to 45 deg bilaterally  SLR laying:  Negative XSLR laying: Negative  Palpable tenderness: Moderate increasing tightness from previous exam. FABER: negative. Sensory change: Gross sensation intact to all lumbar and sacral dermatomes.  Reflexes: 2+ at both patellar tendons, 2+ at achilles tendons, Babinski's downgoing.  Strength at  foot  Plantar-flexion: 5/5 Dorsi-flexion: 5/5 Eversion: 5/5 Inversion: 4/5  Leg strength  Quad: 5/5 Hamstring: 5/5 Hip flexor: 5/5 Hip abductors: 4+/5  Gait unremarkable.   Osteopathic manipulation findings  Cervical  C4 flexed rotated and side bent right Thoracic  T3 extended rotated and side bent right T7 extended rotated and side bent left   Lumbar:  L2 flexed rotated and side bent right  L4 flexed rotated and side bent left   Sacrum  Right on right   Impression and Recommendations:

## 2015-08-28 NOTE — Assessment & Plan Note (Signed)
Patient's neck pain seems to be doing relatively well. We discussed icing regimen and home exercises. Discussed which activities to do in which ones to avoid. Patient does have some tightness. Do not think that this is secondary to her fibromuscular dysplasia. We will continue to monitor. 3 week follow-up.

## 2015-08-28 NOTE — Progress Notes (Signed)
Pre visit review using our clinic review tool, if applicable. No additional management support is needed unless otherwise documented below in the visit note. 

## 2015-09-17 ENCOUNTER — Ambulatory Visit: Payer: Managed Care, Other (non HMO) | Admitting: Family Medicine

## 2015-10-15 ENCOUNTER — Ambulatory Visit: Payer: Managed Care, Other (non HMO) | Admitting: Family Medicine

## 2015-10-28 NOTE — Assessment & Plan Note (Addendum)
Decision today to treat with OMT was based on Physical Exam  After verbal consent patient was treated with HVLA, ME techniques in , thoracic lumbar and sacral areas and only indirect technique on cervical spine  Patient tolerated the procedure well with improvement in symptoms  Patient given exercises, stretches and lifestyle modifications  See medications in patient instructions if given  Patient will follow up in 4-8 weeks

## 2015-10-28 NOTE — Assessment & Plan Note (Signed)
Seems to be stable at this time. Doesn't fibromuscular dysplasia. We'll continue to monitor. Patient is on low-dose gabapentin as well as a muscle relaxer as needed. Patient will come back and see me again in 6-8 weeks

## 2015-10-28 NOTE — Progress Notes (Signed)
Subjective:    XB:JYNWG neck and shoulder pain f/u  HPI: Patient is a very pleasant individual who is coming back for neck and left shoulder pain. Patient was seen recently was having more of an exacerbation. Pain seems to be doing relatively well. More of an upper back pain. Did see and masseuse recently and has been very helpful. Patient continues to train for half marathon. Patient states that this will be the last long run she will do. Patient states some mild tightness of the lower back but very minimal compared to previous.   Past Medical History:  Diagnosis Date  . Chronic back pain    Past Surgical History:  Procedure Laterality Date  . NASAL SEPTUM SURGERY     Social History  Substance Use Topics  . Smoking status: Never Smoker  . Smokeless tobacco: Never Used  . Alcohol use 0.0 oz/week     Comment: 3-4 drinks per week, on occasion   No Known Allergies Family History  Problem Relation Age of Onset  . Heart murmur Mother     Living, 23  . Alzheimer's disease Father     Living, 46  . Vascular Disease Brother   . Healthy Son   . Healthy Daughter      Past medical history, Surgical history, Family history not pertinant except as noted below, Social history, Allergies, and medications have been entered into the medical record, reviewed, and no changes needed.   Review of Systems: No fevers, chills, night sweats, weight loss, chest pain, or shortness of breath. Patient also denies any abdominal pain, any diarrhea or constipation, any nausea or vomiting.  Objective:   Last menstrual period 06/03/2014.  General: Well Developed, well nourished, and in no acute distress.  Neuro: Alert and oriented x3, extra-ocular muscles intact, sensation grossly intact.  HEENT: Normocephalic, atraumatic, pupils equal round reactive to light, neck supple, no masses, no lymphadenopathy, thyroid nonpalpable.  Skin: Warm and dry, no rashes. Cardiac:  no lower extremity  edema. Respiratory: Not using accessory muscles, speaking in full sentences. Abdominal: NT, soft Gait: Nonantlagic, good balance and coordination, patient does have significant shortening of the right leg compared to the contralateral side. Lymphatic: no lymphadenopathy in neck or axillae on palpation, non tender.  Musculoskeletal: Inspection and palpation of the right and left upper extremities including the shoulders elbows and wrist are unremarkable with full range of motion and good muscle strength and tone. Inspection and palpation of the right and left lower extremities including the hips knees and ankles are unremarkable and nontender with full range of motion and good muscle strength and tone and are symmetric. Marland Kitchen Neck: Inspection unremarkable. No palpable stepoffs. Negative Spurling's maneuver. Full neck range of motion Grip strength and sensation normal in bilateral hands Strength good C4 to T1 distribution No sensory change to C4 to T1 Negative Hoffman sign bilaterally Reflexes normal  Back Exam:  Inspection: Unremarkable  Motion: Flexion 45 deg, Extension 25  Side Bending to 45 deg bilaterally,  Rotation to 45 deg bilaterally  SLR laying:  Negative XSLR laying: Negative  Palpable tenderness: continue mild tightness of the paraspinal musculature of the lumbar spine FABER: negative. Sensory change: Gross sensation intact to all lumbar and sacral dermatomes.  Reflexes: 2+ at both patellar tendons, 2+ at achilles tendons, Babinski's downgoing.  Strength at foot  Plantar-flexion: 5/5 Dorsi-flexion: 5/5 Eversion: 5/5 Inversion: 4/5  Leg strength  Quad: 5/5 Hamstring: 5/5 Hip flexor: 5/5 Hip abductors: 4+/5  Gait unremarkable.   Osteopathic  manipulation findings  Cervical  C2 flexed rotated and side bent right Thoracic  T3 extended rotated and side bent right T8 extended rotated and side bent left   Lumbar:  L2 flexed rotated and side bent right  L5 flexed rotated  and side bent left   Sacrum  Right on right   Impression and Recommendations:

## 2015-10-28 NOTE — Assessment & Plan Note (Signed)
Patient will be continued to be followed by neurology.

## 2015-10-29 ENCOUNTER — Ambulatory Visit (INDEPENDENT_AMBULATORY_CARE_PROVIDER_SITE_OTHER): Payer: Managed Care, Other (non HMO) | Admitting: Family Medicine

## 2015-10-29 ENCOUNTER — Encounter: Payer: Self-pay | Admitting: Family Medicine

## 2015-10-29 VITALS — BP 108/72 | HR 53 | Ht 60.0 in | Wt 118.0 lb

## 2015-10-29 DIAGNOSIS — M9902 Segmental and somatic dysfunction of thoracic region: Secondary | ICD-10-CM

## 2015-10-29 DIAGNOSIS — M9903 Segmental and somatic dysfunction of lumbar region: Secondary | ICD-10-CM

## 2015-10-29 DIAGNOSIS — M999 Biomechanical lesion, unspecified: Secondary | ICD-10-CM

## 2015-10-29 DIAGNOSIS — M9904 Segmental and somatic dysfunction of sacral region: Secondary | ICD-10-CM | POA: Diagnosis not present

## 2015-10-29 DIAGNOSIS — I773 Arterial fibromuscular dysplasia: Secondary | ICD-10-CM

## 2015-10-29 DIAGNOSIS — M545 Low back pain, unspecified: Secondary | ICD-10-CM

## 2015-10-29 DIAGNOSIS — M542 Cervicalgia: Secondary | ICD-10-CM

## 2015-10-29 NOTE — Progress Notes (Signed)
Pre visit review using our clinic review tool, if applicable. No additional management support is needed unless otherwise documented below in the visit note. 

## 2015-10-29 NOTE — Assessment & Plan Note (Signed)
Patient overall seems to be doing relatively well. Does have the tight hip flexors tone. Continues to have some mild discomfort in with patient's increasing training likely has some mild exacerbation. We discussed icing regimen. Discussed which activities to do. Which ones to avoid. Patient continues to active. Follow-up with me again in 4-8 weeks for further evaluation and treatment.

## 2015-10-29 NOTE — Patient Instructions (Signed)
Good to see you  You are doing great  Keep it up Watch the knee  6 weeks.

## 2015-12-17 ENCOUNTER — Ambulatory Visit: Payer: Managed Care, Other (non HMO) | Admitting: Family Medicine

## 2015-12-26 NOTE — Progress Notes (Signed)
Subjective:    RU:[id number]:Upper neck and shoulder pain f/u  HPI: Patient is a very pleasant individual who is coming back for neck and left shoulder pain. Patient was seen recently was having more of an exacerbation. Pain seems to be doing relatively well. More of an upper back pain. Has been helping her mom move. Has been doing more the boxes. Has noticed some mild increase in upper back pain. Mild in lower back pain but very minimal. Patient states overall she has been doing very well after the last couple weeks. Also took a hiatus from running for some time and has started again..   Past Medical History:  Diagnosis Date  . Chronic back pain    Past Surgical History:  Procedure Laterality Date  . NASAL SEPTUM SURGERY     Social History  Substance Use Topics  . Smoking status: Never Smoker  . Smokeless tobacco: Never Used  . Alcohol use 0.0 oz/week     Comment: 3-4 drinks per week, on occasion   No Known Allergies Family History  Problem Relation Age of Onset  . Heart murmur Mother     Living, 6679  . Alzheimer's disease Father     Living, 6780  . Vascular Disease Brother   . Healthy Son   . Healthy Daughter     Review of Systems: No fevers, chills, night sweats, weight loss, chest pain, or shortness of breath. Patient also denies any abdominal pain, any diarrhea or constipation, any nausea or vomiting.  Objective:   Blood pressure 124/72, pulse 64, weight 118 lb (53.5 kg), last menstrual period 06/03/2014, SpO2 98 %.  General: Well Developed, well nourished, and in no acute distress.  Neuro: Alert and oriented x3, extra-ocular muscles intact, sensation grossly intact.  HEENT: Normocephalic, atraumatic, pupils equal round reactive to light, neck supple, no masses, no lymphadenopathy, thyroid nonpalpable.  Skin: Warm and dry, no rashes. Cardiac:  no lower extremity edema. Respiratory: Not using accessory muscles, speaking in full sentences. Abdominal: NT, soft Gait: Nonantlagic,  good balance and coordination, patient does have significant shortening of the right leg compared to the contralateral side. Lymphatic: no lymphadenopathy in neck or axillae on palpation, non tender.  Musculoskeletal: Inspection and palpation of the right and left upper extremities including the shoulders elbows and wrist are unremarkable with full range of motion and good muscle strength and tone. Inspection and palpation of the right and left lower extremities including the hips knees and ankles are unremarkable and nontender with full range of motion and good muscle strength and tone and are symmetric. Marland Kitchen. Neck: Inspection unremarkable. No palpable stepoffs. Negative Spurling's maneuver. Mild limitation in side bending bilaterally Grip strength and sensation normal in bilateral hands Strength good C4 to T1 distribution No sensory change to C4 to T1 Negative Hoffman sign bilaterally Reflexes normal  Back Exam:  Inspection: Unremarkable  Motion: Flexion 45 deg, Extension 25  Side Bending to 45 deg bilaterally,  Rotation to 45 deg bilaterally  SLR laying:  Negative XSLR laying: Negative  Palpable tenderness: More tightness around the right sacroiliac area than previously. FABER: negative. Sensory change: Gross sensation intact to all lumbar and sacral dermatomes.  Reflexes: 2+ at both patellar tendons, 2+ at achilles tendons, Babinski's downgoing.  Strength at foot  Plantar-flexion: 5/5 Dorsi-flexion: 5/5 Eversion: 5/5 Inversion: 4/5  Leg strength  Quad: 5/5 Hamstring: 5/5 Hip flexor: 5/5 Hip abductors: 4+/5  Gait unremarkable.   Osteopathic manipulation findings  Cervical  C2 flexed rotated and side  bent right Thoracic  T3 extended rotated and side bent right T7 extended rotated and side bent left   Lumbar:  L2 flexed rotated and side bent right  L4 flexed rotated and side bent left   Sacrum  Right on right   Impression and Recommendations:

## 2015-12-26 NOTE — Assessment & Plan Note (Signed)
Overall doing well.

## 2015-12-26 NOTE — Assessment & Plan Note (Signed)
Decision today to treat with OMT was based on Physical Exam  After verbal consent patient was treated with HVLA, ME techniques in , thoracic lumbar and sacral areas and only indirect technique on cervical spine  Patient tolerated the procedure well with improvement in symptoms  Patient given exercises, stretches and lifestyle modifications  See medications in patient instructions if given  Patient will follow up in 4-8 weeks                              

## 2015-12-27 ENCOUNTER — Encounter: Payer: Self-pay | Admitting: Family Medicine

## 2015-12-27 ENCOUNTER — Ambulatory Visit (INDEPENDENT_AMBULATORY_CARE_PROVIDER_SITE_OTHER): Payer: Managed Care, Other (non HMO) | Admitting: Family Medicine

## 2015-12-27 VITALS — BP 124/72 | HR 64 | Wt 118.0 lb

## 2015-12-27 DIAGNOSIS — M999 Biomechanical lesion, unspecified: Secondary | ICD-10-CM

## 2015-12-27 DIAGNOSIS — M24559 Contracture, unspecified hip: Secondary | ICD-10-CM

## 2015-12-27 DIAGNOSIS — M545 Low back pain, unspecified: Secondary | ICD-10-CM

## 2015-12-27 DIAGNOSIS — M9904 Segmental and somatic dysfunction of sacral region: Secondary | ICD-10-CM | POA: Diagnosis not present

## 2015-12-27 DIAGNOSIS — M9903 Segmental and somatic dysfunction of lumbar region: Secondary | ICD-10-CM

## 2015-12-27 DIAGNOSIS — M9902 Segmental and somatic dysfunction of thoracic region: Secondary | ICD-10-CM

## 2015-12-27 NOTE — Patient Instructions (Signed)
Good luck with mom 6-8 weeks.

## 2015-12-27 NOTE — Assessment & Plan Note (Signed)
Still having tightness. Patient has been doing some stretching encourage her to do it more regular exercises. Avoid certain exercises and increasing stride  RTC in 2 months.

## 2016-01-06 ENCOUNTER — Telehealth: Payer: Self-pay | Admitting: Neurology

## 2016-01-06 NOTE — Telephone Encounter (Signed)
PT called and said she is having pain and would like a call back/Dawn CB# [phone]

## 2016-01-06 NOTE — Telephone Encounter (Signed)
Please see below.

## 2016-01-06 NOTE — Telephone Encounter (Signed)
I spoke with patient and she said that she is having pain on the crown of her head and to the right of it.  States that head is tender to the touch.  She is not sure what could be causing this.

## 2016-01-07 NOTE — Telephone Encounter (Signed)
It's difficult to determine what her pain is coming from without being evaluated.  Recommend setting up f/u visit - has she seen her PCP for this yet as it may be quicker for her to see them?  Any new neurological symptoms?  I'm happy to see her, but unfortunately, I do not have any immediate f/u slots.   Pegi Milazzo K. Allena KatzPatel, DO

## 2016-01-07 NOTE — Telephone Encounter (Signed)
Please advise 

## 2016-01-10 NOTE — Telephone Encounter (Signed)
Please follow up on message from [facility] inbasket. Thanks so much.  

## 2016-01-13 NOTE — Telephone Encounter (Signed)
Advised pt of below. She states she has not seen her PCP yet but she is going to set up a f/u here and meanwhile she is going to try and get in to see her PCP. Pain has now moved to her neck.

## 2016-02-19 ENCOUNTER — Ambulatory Visit: Payer: Managed Care, Other (non HMO) | Admitting: Family Medicine

## 2016-02-26 ENCOUNTER — Ambulatory Visit (INDEPENDENT_AMBULATORY_CARE_PROVIDER_SITE_OTHER): Payer: Managed Care, Other (non HMO) | Admitting: Neurology

## 2016-02-26 ENCOUNTER — Encounter: Payer: Self-pay | Admitting: Neurology

## 2016-02-26 VITALS — BP 120/80 | HR 85 | Ht 60.0 in | Wt 120.2 lb

## 2016-02-26 DIAGNOSIS — M5481 Occipital neuralgia: Secondary | ICD-10-CM | POA: Diagnosis not present

## 2016-02-26 DIAGNOSIS — I773 Arterial fibromuscular dysplasia: Secondary | ICD-10-CM | POA: Diagnosis not present

## 2016-02-26 MED ORDER — CYCLOBENZAPRINE HCL 5 MG PO TABS: 5.0000 mg | ORAL_TABLET | Freq: Every evening | ORAL | 3 refills | Status: DC | PRN

## 2016-02-26 NOTE — Progress Notes (Signed)
Follow-up Visit   Date: 02/26/16    Rachel Golden MRN: [id number] DOB: September 24, 1965   Interim History: Rachel Golden is a 50 y.o. right-handed Caucasian female with chronic back pain returning to the clinic with new complaints of right neck pain.  She was previously seen in the office for fibromuscular dysplasia and stroke.  The patient was accompanied to the clinic by husband who also provides collateral information.    History of present illness: On May 13th 2016, she was picking up a hanger off the floor and developed acute onset of dizziness.  She initially thought it was because she got up too quickly.  She then heard a "swooshing" sound in her head and developed a piercing headache.  She took advil which alleviated her headache. Swooshing sound resolved within 5 minutes.  Within 30-minutes, she developed right hand tingling and numbness of the lips.  She called 911 who evaluated her and did not feel that her symptoms were consistent with a stroke, so was not taken to the emergency department.  She also recalls having neck pain especially with neck rotation to the right.   On May 19th, she was getting out of the car and developed neck stiffness and heard a swooshing sound in her right ear with associated brief but severe headache.  She also has associated vision disturbance described as "kelidoscope" vision, dizzy, and nauseous. She had difficulty walking out of her car to her home, but was able to make it into her home slowly.  Symptoms lasted about 10-minutes.   She again went to see Dr. Katrinka Blazing on the same day who ordered MRI/A of her head.  Imaging shows small infarcts involving the right cerebellum and medial right parieto-occipital region. Vessel imaging was shows vascular irregularity involving bilateral vertebral arteries with ?right dissection, concerning for fibromuscular dysplasia but I do not appreciate this well on my review.  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  She was  started on aspirin 325mg  and lipitor 20mg  daily.  No personal or family history of stroke.  No personal history of hypertension.  Of note, she has received cervical manipulation for stiffness this month, which alleviated her neck discomfort.  UPDATE 10/04/2014:  She came back from a [facility] trip with her family in 10/06/2014 and [facility] and reports having a sensation of dizziness lasting for about 5 minutes while there.  There was no associated headache or whooshing sound.  The spells resolved and she has no had anything similar again. Her stroke work-up has been returning largely normal including echocardiogram, holter monitor, and vasculitis/hypercoagulable labs. She was very happy with her evaluation with Dr. [place] who suggested that only if symptoms worsen or she develops new headache, would cerebral angiogram be indicated.    UPDATE 03/07/2015:  She is here for 77-month follow-up visit and has no new complaints.  She is doing well, no episodes of dizziness or other neurological spells.  She continues to have low back pain and had two ESI.  She is getting massage therapy and asking if this would be okay to received on her neck.  She does not get skeletal manipulation.  UPDATE 04/29/2015:  Starting in December, she began experiencing high-pitched sound in the right ear, neck burning, and imbalance.  Neck discomfort is constant and involves the right side, which is worse when at rest and improved with activity. Heat and cold compresses alleviates her discomfort.  She has not tried NSAIDs or tylenol.  She has noticed  the high-pitched sound and imbalance about 4-5 times.    She endorses a significant amount of stress related to her father passing in August and having to manage her mother.   UPDATE 02/26/2016:  Starting around the summer of 2017, she began having achy pain over the base of the head on the right.  It is tender to touch, but she denies any electrical or shooting pain into her scalp.  She  has not tried NSAIDs or flexeril for her pain.  No new numbness/tingling, weakness, falls, or hospitalizations.    Medications:  Current Outpatient Prescriptions on File Prior to Visit  Medication Sig Dispense Refill  . aspirin EC 81 MG tablet Take 1 tablet (81 mg total) by mouth daily.    Marland Kitchen gabapentin (NEURONTIN) 100 MG capsule Take 2 capsules (200 mg total) by mouth at bedtime. (Patient not taking: Reported on 02/26/2016) 60 capsule 3  . hydrOXYzine (ATARAX/VISTARIL) 25 MG tablet 1 TABLET BY MOUTH AT BEDTIME TAKE 1 TABLET AT NIGHT. MAY CAUSE DROWSINESS.  0  . permethrin (ELIMITE) 5 % cream APPLY 1 THIN COAT FROM NECK DOWN AND WASH OFF IN THE AM. REPEAT IN 1 WEEK.  2  . traMADol (ULTRAM) 50 MG tablet Take 1 tablet (50 mg total) by mouth every 12 (twelve) hours as needed. (Patient not taking: Reported on 02/26/2016) 60 tablet 0  . triamcinolone cream (KENALOG) 0.1 % 1 APPLICATION APPLY ON THE SKIN AS DIRECTED APPLY TO AFFECTED AREAS DAILY AS NEEDED.  3  . ZORVOLEX 18 MG CAPS as needed.   1   No current facility-administered medications on file prior to visit.     Allergies: No Known Allergies  Review of Systems:  CONSTITUTIONAL: No fevers, chills, night sweats, or weight loss.  EYES: No visual changes or eye pain ENT: No hearing changes.  No history of nose bleeds.   RESPIRATORY: No cough, wheezing and shortness of breath.   CARDIOVASCULAR: Negative for chest pain, and palpitations.   GI: Negative for abdominal discomfort, blood in stools or black stools.  No recent change in bowel habits.   GU:  No history of incontinence.   MUSCLOSKELETAL: No history of joint pain or swelling.  No myalgias.   SKIN: Negative for lesions, rash, and itching.   ENDOCRINE: Negative for cold or heat intolerance, polydipsia or goiter.   PSYCH:  No depression or anxiety symptoms.   NEURO: As Above.   Vital Signs:  BP 120/80   Pulse 85   Ht 5' (1.524 m)   Wt 120 lb 3 oz (54.5 kg)   LMP 06/03/2014  (Approximate)   SpO2 98%   BMI 23.47 kg/m   Neurological Exam: MENTAL STATUS including orientation to time, place, person, recent and remote memory, attention span and concentration, language, and fund of knowledge is normal.  Speech is not dysarthric.  CRANIAL NERVES: Normal fundoscopic exam bilaterally. No visual field defects. Pupils equal round and reactive to light. No Horner's syndrome.  Normal conjugate, extra-ocular eye movements in all directions of gaze.  No ptosis. Normal facial sensation.  Face is symmetric. Palate elevates symmetrically.  Tongue is midline.  MOTOR:  Motor strength is 5/5 in all extremities.  Tone is normal.  There is point tenderness over the right GON  MSRs:  Reflexes are brisk and symmetric 3+/4 throughout.  SENSORY:  Intact to vibration.  COORDINATION/GAIT:  Normal finger-to- nose-finger.  Intact rapid alternating movements bilaterally.  Gait narrow based and stable. Tandem gait intact.  Data: Labs  08/23/2014:  CRP <0.1, ESR 12, ANA neg, HbA1c 5.5, LDL 55, HDL 76, C3/C4 normal, ENA neg, ANA neg, RF neg, hypercoagulable panel negative (normal protein C and S activity, no Factor V leiden mutation, normal antithrombin III and anticardiolipin antibody)  Surface echocardiogram 08/31/2014:  normal, EF 60% no thrombus  08/30/2014  48-hr holter:   No significant arrhythmias.  No atrial fib or etiology for an embolic neuro event.  Rare atrial and ventricular ectopy noted.  Predominant rhythm is sinus.     MRI brain 08/27/2014:  Subacute nonhemorrhagic small infarcts involving mid to inferior right cerebellum and medial aspect of the right parietal -occipital lobe.  No intracranial hemorrhage.  MRA head 08/27/2014:  Ectatic cavernous segment internal carotid artery bilaterally. Mild narrowing petrous segment of the internal carotid artery bilaterally. Middle cerebral mild to moderate branch vessel narrowing and irregularity bilaterally greater on the right.   1.6 x 1.6  mm bulge M1 segment right middle cerebral artery appears to be origin of a vessel rather than saccular aneurysm. Stability can be confirmed on follow-up.   Moderate narrowing at the junction of the A1 segment and A2 segment of the right anterior cerebral artery.  Ectatic vertebral arteries and basilar artery without high-grade stenosis.  Full extent of the left posterior inferior cerebellar artery not imaged on present exam. This is noted on the contrast-enhanced MR angiogram of the neck and appears ectatic were proximal narrowing.  Nonvisualized left anterior inferior cerebellar artery.  Narrowing and irregularity superior cerebellar artery greater on the left.  Posterior cerebral artery branch vessel narrowing and irregularity greater on the left.   MRA NECK FINDINGS Ectatic pleated dilated appearance of the vertebral arteries bilaterally suggestive of fibromuscular dysplasia. With the patient's subacute right sided infarcts, subtle focal dissection with subsequent embolic thrombus cannot be entirely excluded although not detected on the current exam   No significant stenosis of either carotid bifurcation. Ectatic internal carotid artery cervical segment bilaterally may also reflect changes of fibromuscular dysplasia but without pleated appearance as noted involving the vertebral arteries.   MRA head 05/03/2015: 1. 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 No associated stenosis. The bilateral V4 segments appear spared. 2. Bilateral ICA dolichoectasia, without the cervical ICA beaded appearance typical of carotid FMD. No carotid stenosis. 3.  Negative intracranial MRA aside from ICA siphon tortuosity.  IMPRESSION/PLAN: 1.  Right occipital neuralgia, mild  - There is no radiation of paresthesias or pain into her scalp, but she has localized tenderness  over the right GON  - Start using flexeril '5mg'$  at bedtime  - OK to use NSAIDs for severe pain, but limit to twice per week  2.  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  Greater than 50% of this time was spent in counseling, explanation of diagnosis, planning of further management, and coordination of care.   Thank you for allowing me to participate in patient's care.  If I can answer any additional questions, I would be pleased to do so.    Sincerely,    Janel Beane K. Posey Pronto, DO

## 2016-02-26 NOTE — Patient Instructions (Addendum)
1.  Start flexeril 5mg  at bedtime 2.  You can also take ibuprofen or Aleve for your headaches on severe days.  Limit to twice per week 3.  Continue neck stretches 4.  If your symptoms do not improve, please call my office to schedule a nerve block  Return to clinic in 9 months

## 2016-03-16 IMAGING — MR MR MRA NECK WO/W CM
4 of 5 series · 16 of 48 positions shown · IV contrast (multihance)
Comparison: Brain MRI, head and neck MRA 08/27/2014

CLINICAL DATA: 49-year-old female with small right cerebellar and
occipital infarcts in August 2014. Suspected vertebral artery
fibromuscular dysplasia at that time. Right side tinnitus.
Intermittent lightheadedness. Subsequent encounter.

EXAM:
MRA NECK WITHOUT AND WITH CONTRAST
MRA HEAD WITHOUT CONTRAST
TECHNIQUE: Multiplanar and multiecho pulse sequences of the neck were obtained
without and with intravenous contrast. Angiographic images of the
neck were obtained using MRA technique without and with intravenous
contast.; Angiographic images of the Circle of Willis were obtained
using MRA technique without intravenous contrast.
CONTRAST:  10 mL MultiHance

[Series 8: tof_fl3d_tra_p2_multi-slab · axial · 0.6mm · 0.26mm/px · z∈[-75,+18]mm · 7 of 175 slices shown]
[im 1/175]
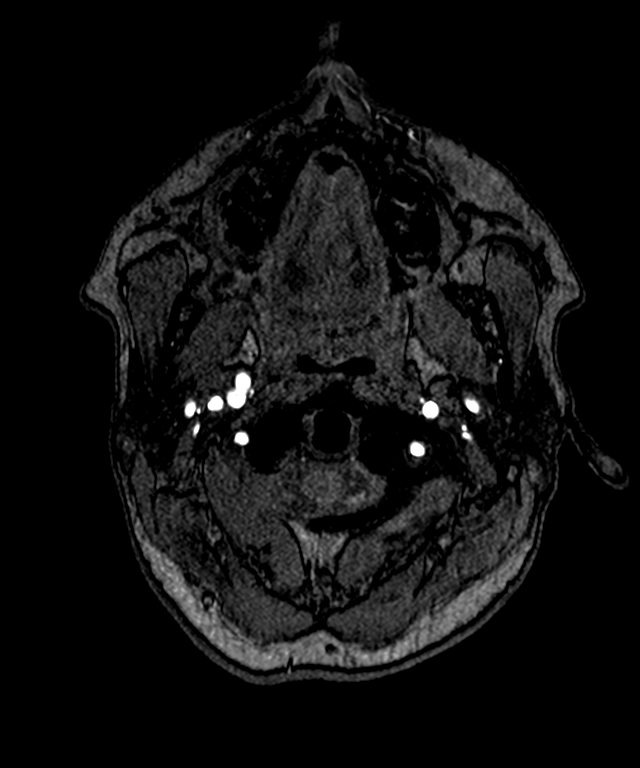
[im 18/175]
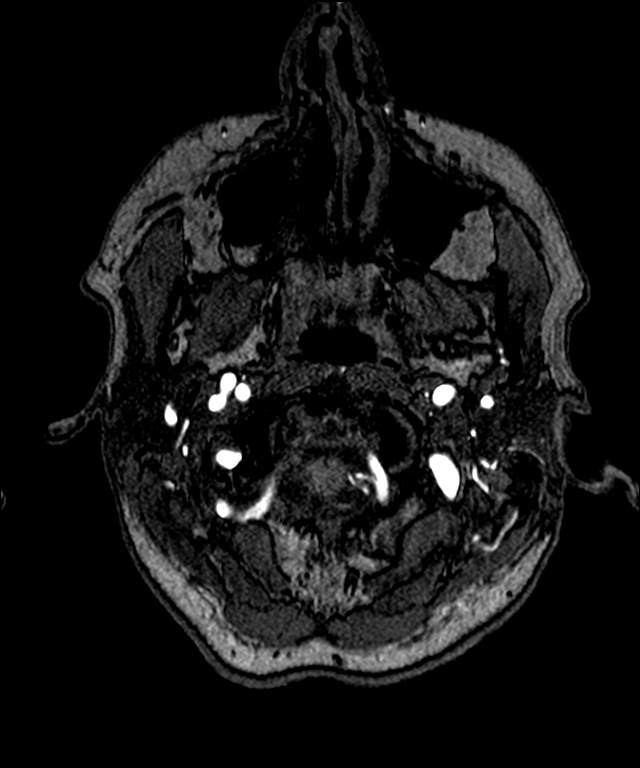
[im 35/175]
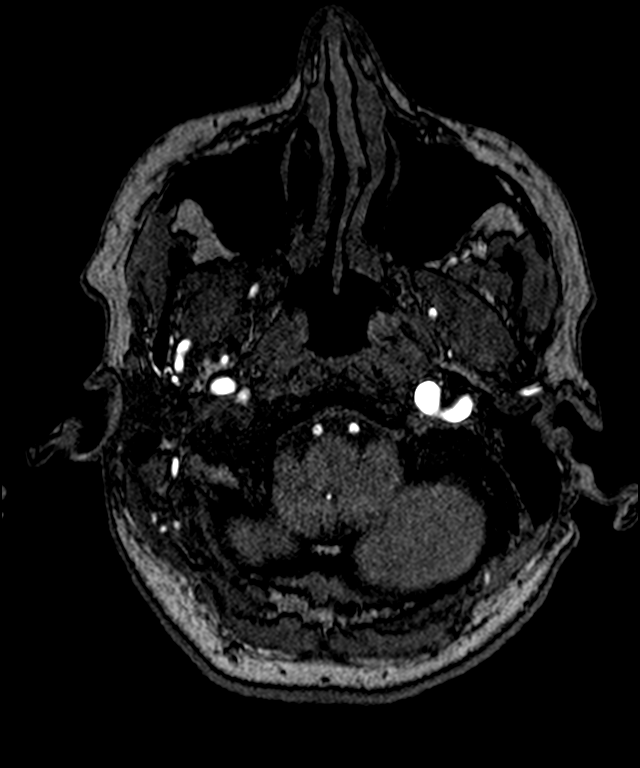
[im 53/175]
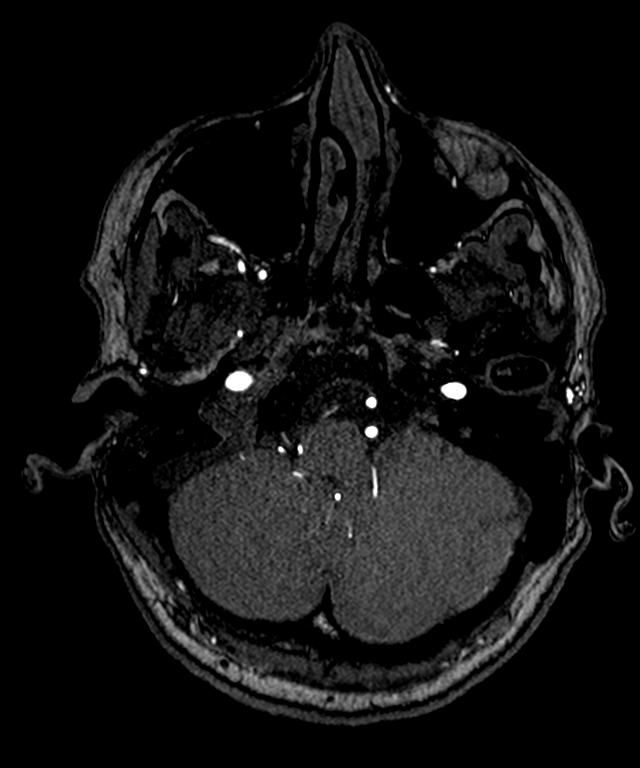
[im 70/175]
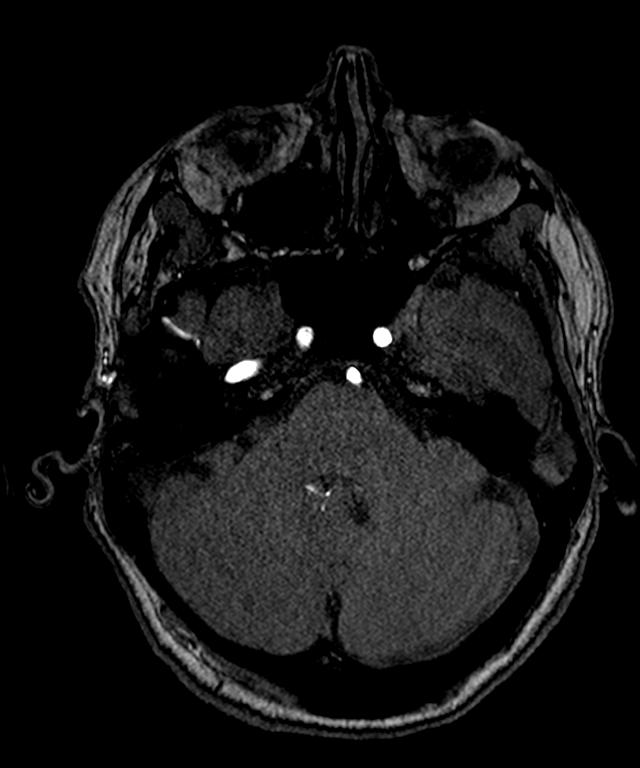
[im 88/175]
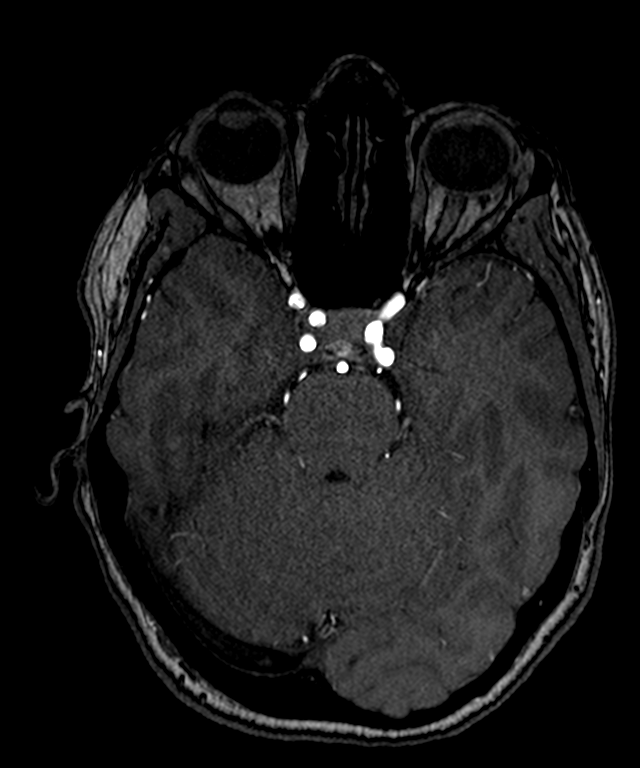
[im 157/175]
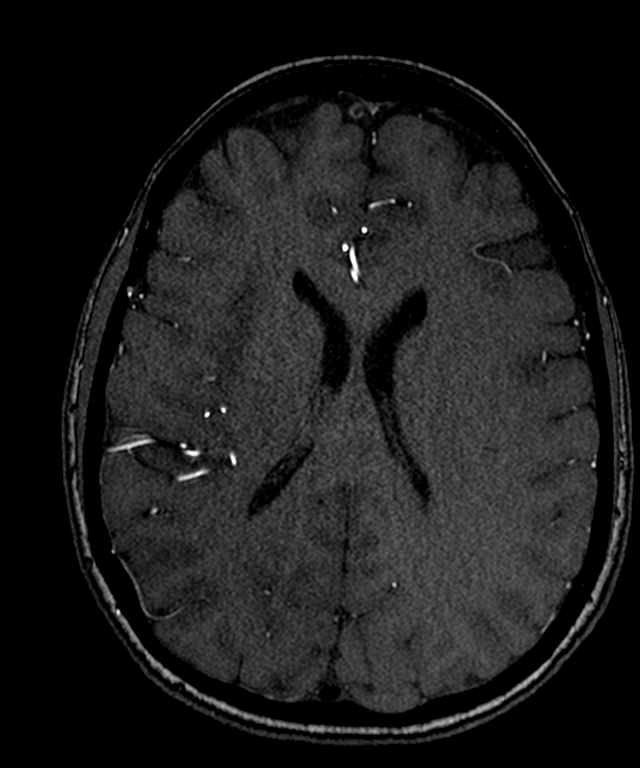

[Series 22: angio_fl3d_cor_post · coronal · 0.8mm · 0.85mm/px · 3 of 95 slices shown]
[im 19/95]
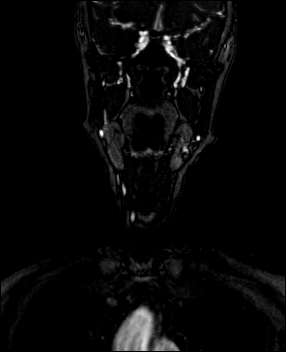
[im 57/95]
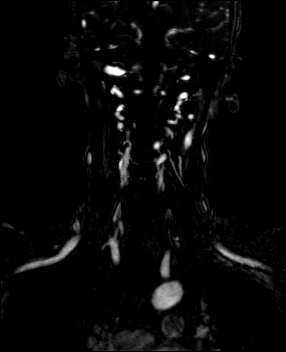
[im 95/95]
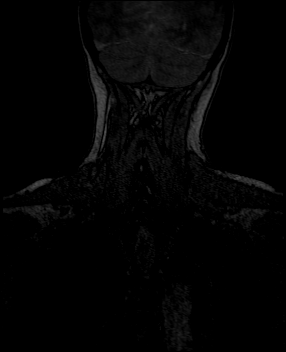

[Series 23: angio_fl3d_cor_post_sub · coronal · 0.8mm · 0.85mm/px · 3 of 96 slices shown]
[im 20/96]
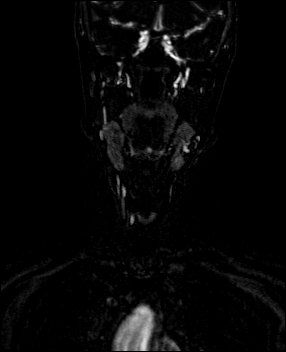
[im 58/96]
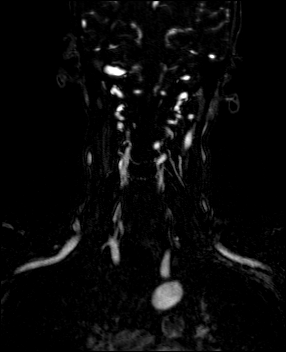
[im 96/96]
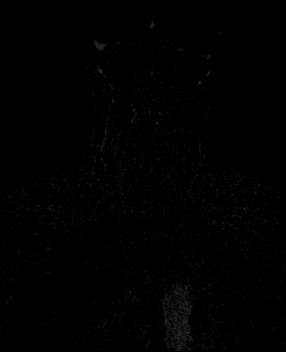

[Series 109: axial carotid · axial · 2.0mm · 0.85mm/px · z∈[-256,-45]mm · 3 of 141 slices shown]
[im 18/141]
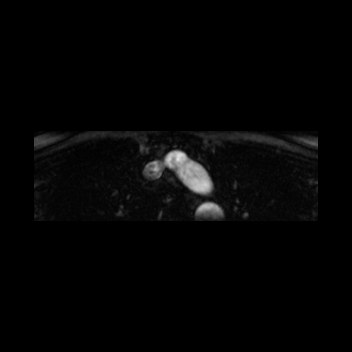
[im 71/141]
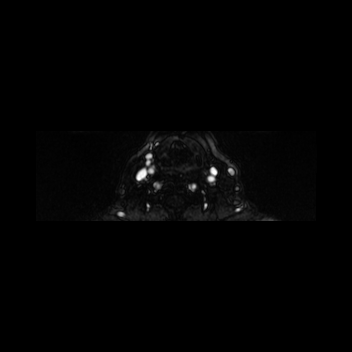
[im 123/141]
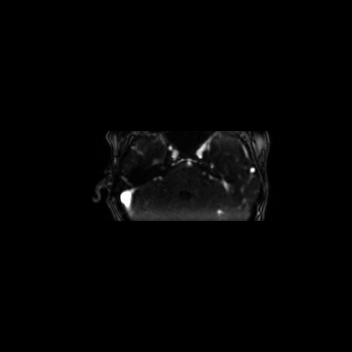

[16 of 48 positions shown; findings below may reference images not displayed]

FINDINGS: MRA NECK FINDINGS

Precontrast time-of-flight images demonstrate antegrade flow in both
carotid and vertebral arteries in the neck. Normal time-of-flight
appearance of the bilateral common carotid arteries and carotid
bifurcations. Tortuosity, and dolichoectasia but otherwise negative
time-of-flight appearance of the cervical ICAs.

Normal time-of-flight appearance of both vertebral artery origins.
Codominant vertebral arteries with intermittent tortuosity
throughout the neck. Beginning in the distal V2 segment on the left
and the V3 segment on the beaded appearance of both vertebral
arteries (right there is sudden abnormal series 108, image 17 and
image 12). No hemodynamically significant stenosis is identified.
The vessel irregularity continues to the skullbase.

Post-contrast MRA images suffer from venous contamination. Three
vessel arch configuration is demonstrated. No great vessel origin
stenosis. No cervical carotid stenosis. The vertebral artery
irregularity is better demonstrated on the time-of-flight images due
to paravertebral venous contamination.

MRA HEAD FINDINGS

Antegrade flow in the posterior circulation with codominant distal
vertebral arteries. Bilateral V3 segment beaded appearance re-
demonstrated. However, both V4 segments appear normal. Normal PICA
origins. Vertebrobasilar junction, basilar artery, SCA origins and
PCA origins are normal. Right posterior communicating artery is
present, the left is diminutive or absent. Bilateral PCA branches
are within normal limits.

Tortuous, dolichoectatic distal cervical ICAs re- demonstrated.
Antegrade flow in both ICA siphons. Tortuosity, but no siphon
stenosis. Ophthalmic and posterior communicating artery origins are
normal. Normal carotid termini. Normal MCA and ACA origins. Mildly
dominant left ACA A1 segment. Both a ones are mildly tortuous.
Anterior communicating artery and visualized bilateral ACA branches
are within normal limits. MCA M1 segments, bifurcations, and
visualized bilateral MCA branches are within normal limits.
IMPRESSION: 1. Bilateral vertebral artery beaded appearance in the neck,
involving the left distal V2 and V3 segments an the right V3
segment, most compatible with vertebral artery Fibromuscular
Dysplasia. No associated stenosis. The bilateral V4 segments appear
spared.
2. Bilateral ICA dolichoectasia, without the cervical ICA beaded
appearance typical of carotid FMD. No carotid stenosis.
3.  Negative intracranial MRA aside from ICA siphon tortuosity.

## 2016-03-27 ENCOUNTER — Ambulatory Visit (INDEPENDENT_AMBULATORY_CARE_PROVIDER_SITE_OTHER): Payer: Managed Care, Other (non HMO) | Admitting: Family Medicine

## 2016-03-27 VITALS — BP 118/74 | HR 80 | Temp 98.4°F | Resp 18 | Ht 60.0 in | Wt 120.0 lb

## 2016-03-27 DIAGNOSIS — B9789 Other viral agents as the cause of diseases classified elsewhere: Secondary | ICD-10-CM | POA: Diagnosis not present

## 2016-03-27 DIAGNOSIS — R05 Cough: Secondary | ICD-10-CM | POA: Diagnosis not present

## 2016-03-27 DIAGNOSIS — R059 Cough, unspecified: Secondary | ICD-10-CM

## 2016-03-27 DIAGNOSIS — J069 Acute upper respiratory infection, unspecified: Secondary | ICD-10-CM

## 2016-03-27 NOTE — Patient Instructions (Addendum)
Drink plenty of fluids and get enough rest  Continue to use over-the-counter symptomatic cough and cold symptom relievers as needed.  Return if getting abruptly worse at any time such as high fevers, shortness of breath, weakness, etc.  Gauge your exertion depending on how you're feeling.    IF you received an x-ray today, you will receive an invoice from [facility]. Please contact [facility] at [phone] with questions or concerns regarding your invoice.   IF you received labwork today, you will receive an invoice from [facility]. Please contact [facility] at [phone] with questions or concerns regarding your invoice.   Our billing staff will not be able to assist you with questions regarding bills from these companies.  You will be contacted with the lab results as soon as they are available. The fastest way to get your results is to activate your My Chart account. Instructions are located on the last page of this paperwork. If you have not heard from us regarding the results in 2 weeks, please contact this office.

## 2016-03-27 NOTE — Progress Notes (Signed)
Patient ID: Rachel Golden, female    DOB: 07/19/1965  Age: 50 y.o. MRN: [id number]  Chief Complaint  Patient presents with  . Nasal Congestion  . Cough    Subjective:   Patient is been ill for several days with a respiratory tract infection with nasal congestion or cough. She's not been febrile. Not bringing up excessive purulent material. She is an Airline pilotaccountant. Her husband was diagnosed with a pneumonia this morning, so she felt like it is best to go and get checked especially with this being a holiday weekend.  Current allergies, medications, problem list, past/family and social histories reviewed.  Objective:  BP 118/74 (BP Location: Right Arm, Patient Position: Sitting, Cuff Size: Small)   Pulse 80   Temp 98.4 F (36.9 C) (Oral)   Resp 18   Ht 5' (1.524 m)   Wt 120 lb (54.4 kg)   LMP 06/03/2014 (Approximate)   SpO2 96%   BMI 23.44 kg/m   Pleasant, alert and oriented. Does not appear in major distress. TMs are normal. Throat clear. Neck supple without significant nodes. Chest is clear anteriorly and posteriorly. Heart regular without murmur.  Assessment & Plan:   Assessment: 1. Cough   2. Viral upper respiratory infection       Plan: URI should respond to symptomatic treatment and a little more time.  No orders of the defined types were placed in this encounter.   No orders of the defined types were placed in this encounter.        Patient Instructions   Drink plenty of fluids and get enough rest  Continue to use over-the-counter symptomatic cough and cold symptom relievers as needed.  Return if getting abruptly worse at any time such as high fevers, shortness of breath, weakness, etc.  Gauge your exertion depending on how you're feeling.    IF you received an x-ray today, you will receive an invoice from [facility]. Please contact [facility] at [phone] with questions or concerns regarding your invoice.   IF you received labwork  today, you will receive an invoice from [facility]. Please contact [facility] at [phone] with questions or concerns regarding your invoice.   Our billing staff will not be able to assist you with questions regarding bills from these companies.  You will be contacted with the lab results as soon as they are available. The fastest way to get your results is to activate your My Chart account. Instructions are located on the last page of this paperwork. If you have not heard from us regarding the results in 2 weeks, please contact this office.        Return if symptoms worsen or fail to improve.   Shalaine Payson, MD 03/27/2016

## 2016-04-03 ENCOUNTER — Ambulatory Visit: Payer: Managed Care, Other (non HMO) | Admitting: Neurology

## 2016-08-11 NOTE — Progress Notes (Signed)
Tawana ScaleZach Steffie Waggoner D.O. [facility] [address] [place], [place] [address] Phone: [phone]) [phone] Subjective:    I'm seeing this patient by the request  of:    CC: knee pain   BJY:NWGNFAOZHYHPI:Subjective  Grayland Ormondndrea Askari is a 51 y.o. female coming in with complaint of right knee pain. Patient has been training for half marathon and has noticed pain on the posterior aspect of the knee. Patient rates the severity pain is 4 out of 10 worse after 8 miles. No injury, goes away after walking, no back pain with it.   Mild discomfort of lower back.  Has responded to OMT previously. Doing HEP.  No radiation down the leg.  Avoid neck due to fibromuscular dysplasia.       Past Medical History:  Diagnosis Date  . Chronic back pain   . Stroke [facility](HCC)    Past Surgical History:  Procedure Laterality Date  . NASAL SEPTUM SURGERY     Social History   Social History  . Marital status: Married    Spouse name: N/A  . Number of children: N/A  . Years of education: N/A   Social History Main Topics  . Smoking status: Never Smoker  . Smokeless tobacco: Never Used  . Alcohol use 0.0 oz/week     Comment: 3-4 drinks per week, on occasion  . Drug use: No  . Sexual activity: Not Asked   Other Topics Concern  . None   Social History Narrative   Lives with husband and 2 children in a 2 story home.     Works as an Airline pilotaccountant part time.     Education: BS degree.   No Known Allergies Family History  Problem Relation Age of Onset  . Heart murmur Mother     Living, 7779  . Alzheimer's disease Father     Living, 7580  . Vascular Disease Brother   . Healthy Son   . Healthy Daughter     Past medical history, social, surgical and family history all reviewed in electronic medical record.  No pertanent information unless stated regarding to the chief complaint.   Review of Systems:Review of systems updated and as accurate as of 08/12/16  No headache, visual changes, nausea, vomiting, diarrhea, constipation,  dizziness, abdominal pain, skin rash, fevers, chills, night sweats, weight loss, swollen lymph nodes, body aches, joint swelling, chest pain, shortness of breath, mood changes.  + muslce aches.   Objective  Blood pressure 110/70, pulse (!) 58, resp. rate 16, weight 118 lb 4 oz (53.6 kg), last menstrual period 06/03/2014, SpO2 98 %. Systems examined below as of 08/12/16   General: No apparent distress alert and oriented x3 mood and affect normal, dressed appropriately.  HEENT: Pupils equal, extraocular movements intact  Respiratory: Patient's speak in full sentences and does not appear short of breath  Cardiovascular: No lower extremity edema, non tender, no erythema  Skin: Warm dry intact with no signs of infection or rash on extremities or on axial skeleton.  Abdomen: Soft nontender  Neuro: Cranial nerves II through XII are intact, neurovascularly intact in all extremities with 2+ DTRs and 2+ pulses.  Lymph: No lymphadenopathy of posterior or anterior cervical chain or axillae bilaterally.  Gait normal with good balance and coordination.  MSK:  Non tender with full range of motion and good stability and symmetric strength and tone of shoulders, elbows, wrist, hip, and ankles bilaterally.  Knee:right Normal to inspection with no erythema or effusion or obvious bony abnormalities. Tender  over distal hamstring posteriorly laterally no mass felt ROM full in flexion and extension and lower leg rotation. Ligaments with solid consistent endpoints including ACL, PCL, LCL, MCL. Negative Mcmurray's, Apley's, and Thessalonian tests. Non painful patellar compression. Patellar glide with mild crepitus. Patellar and quadriceps tendons unremarkable. Hamstring and quadriceps strength is normal.  Contralateral knee unremarkable Back Exam:  Inspection: Unremarkable  Motion: Flexion 45 deg, Extension 25 deg, Side Bending to 45 deg bilaterally,  Rotation to 35 deg bilaterally  SLR laying: Negative  XSLR  laying: Negative  Palpable tenderness: tender to palpation in the paraspinal musculature of the lumbar spine. FABER: negative. Sensory change: Gross sensation intact to all lumbar and sacral dermatomes.  Reflexes: 2+ at both patellar tendons, 2+ at achilles tendons, Babinski's downgoing.  Strength at foot  Plantar-flexion: 5/5 Dorsi-flexion: 5/5 Eversion: 5/5 Inversion: 5/5  Leg strength  Quad: 5/5 Hamstring: 5/5 Hip flexor: 5/5 Hip abductors: 5/5  Gait unremarkable.  Osteopathic findings T3 extended rotated and side bent right inhaled third rib T9 extended rotated and side bent left L2 flexed rotated and side bent right L4 flexed rotated and side bent left Sacrum right on right   Procedure note 97110; 15 minutes spent for Therapeutic exercises as stated in above notes.  This included exercises focusing on stretching, strengthening, with significant focus on eccentric aspects.  Eccentric for hamstring and nordic exercises.  Proper technique shown and discussed handout in great detail with ATC.  All questions were discussed and answered.     Impression and Recommendations:     This case required medical decision making of moderate complexity.      Note: This dictation was prepared with Dragon dictation along with smaller phrase technology. Any transcriptional errors that result from this process are unintentional.

## 2016-08-12 ENCOUNTER — Ambulatory Visit (INDEPENDENT_AMBULATORY_CARE_PROVIDER_SITE_OTHER): Payer: Managed Care, Other (non HMO) | Admitting: Family Medicine

## 2016-08-12 ENCOUNTER — Encounter: Payer: Self-pay | Admitting: Family Medicine

## 2016-08-12 VITALS — BP 110/70 | HR 58 | Resp 16 | Wt 118.2 lb

## 2016-08-12 DIAGNOSIS — S76301A Unspecified injury of muscle, fascia and tendon of the posterior muscle group at thigh level, right thigh, initial encounter: Secondary | ICD-10-CM | POA: Diagnosis not present

## 2016-08-12 DIAGNOSIS — M999 Biomechanical lesion, unspecified: Secondary | ICD-10-CM

## 2016-08-12 DIAGNOSIS — M5416 Radiculopathy, lumbar region: Secondary | ICD-10-CM

## 2016-08-12 NOTE — Assessment & Plan Note (Signed)
Will monitor, no radiaton today  COuld be part of the hamstring.  Responded well to OMT COnitnue core strength  f/u in 4 weeks or sooner if worsening.

## 2016-08-12 NOTE — Patient Instructions (Signed)
Good to see you  Rachel Golden is your friend.  Thigh compression sleeve on right side with running When feeling it shorten stride.  Exercises 3 times a week after race 2 tennis ball in a tube sock and lay on them where head meets neck until you feel a release.  See me again in 4-6 weeks.

## 2016-08-12 NOTE — Assessment & Plan Note (Addendum)
Discussed OMT Compression, HEP, discussed nordic and askling exercises Discussed stride length  RTC in 4 weeks.

## 2016-08-12 NOTE — Assessment & Plan Note (Signed)
Decision today to treat with OMT was based on Physical Exam  After verbal consent patient was treated with HVLA, ME, FPR techniques in , thoracic, rib lumbar and sacral areas  Patient tolerated the procedure well with improvement in symptoms  Patient given exercises, stretches and lifestyle modifications  See medications in patient instructions if given  Patient will follow up in 4 weeks

## 2016-09-01 ENCOUNTER — Ambulatory Visit (INDEPENDENT_AMBULATORY_CARE_PROVIDER_SITE_OTHER): Payer: Managed Care, Other (non HMO) | Admitting: Neurology

## 2016-09-01 ENCOUNTER — Encounter: Payer: Self-pay | Admitting: Neurology

## 2016-09-01 VITALS — BP 110/70 | HR 65 | Ht 60.0 in | Wt 118.6 lb

## 2016-09-01 DIAGNOSIS — M62838 Other muscle spasm: Secondary | ICD-10-CM

## 2016-09-01 DIAGNOSIS — M5481 Occipital neuralgia: Secondary | ICD-10-CM | POA: Diagnosis not present

## 2016-09-01 MED ORDER — CYCLOBENZAPRINE HCL 5 MG PO TABS: 5.0000 mg | ORAL_TABLET | Freq: Every evening | ORAL | 3 refills | Status: DC | PRN

## 2016-09-01 NOTE — Progress Notes (Signed)
Follow-up Visit   Date: 09/01/16    Rachel Golden MRN: [id number] DOB: 09-04-1965   Interim History: Rachel Golden is a 51 y.o. right-handed Caucasian female with chronic back pain, fibromuscular dysplasia and stroke returning to the clinic with new complaints of right neck pain.  The patient was accompanied to the clinic by self.  History of present illness: On May 13th 2016, she was picking up a hanger off the floor and developed acute onset of dizziness.  She initially thought it was because she got up too quickly.  She then heard a "swooshing" sound in her head and developed a piercing headache. Swooshing sound resolved within 5 minutes.  Within 30-minutes, she developed right hand tingling and numbness of the lips.  She called 911 who evaluated her and did not feel that her symptoms were consistent with a stroke, so was not taken to the emergency department.  She also recalls having neck pain especially with neck rotation to the right. On May 19th, she was getting out of the car and developed neck stiffness and heard a swooshing sound in her right ear with associated brief but severe headache.  She also has associated vision disturbance described as "kelidoscope" vision, dizzy, and nauseous. She had difficulty walking out of her car to her home, but was able to make it into her home slowly.  Symptoms lasted about 10-minutes.   She again went to see Dr. [name] on the same day who ordered MRI/A of her head.  Imaging shows small infarcts involving the right cerebellum and medial right parieto-occipital region. Vessel imaging was shows vascular irregularity involving bilateral vertebral arteries with ?right dissection, concerning for fibromuscular dysplasia but I do not appreciate this well on my review.  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  She was started on aspirin 334m and lipitor 211mdaily.  No personal or family history of stroke.  No personal history of hypertension.  Of note, she  has received cervical manipulation for stiffness this month, which alleviated her neck discomfort.  UPDATE 10/04/2014:  [facility] [place] sensation of dizziness lasting for about 5 minutes while there.  There was no associated headache or whooshing sound.  The spells resolved and she has no had anything similar again. Her stroke work-up has been returning largely normal including echocardiogram, holter monitor, and vasculitis/hypercoagulable labs. She was very happy with her evaluation with Dr. NuKathyrn Sheriffho suggested that only if symptoms worsen or she develops new headache, would cerebral angiogram be indicated.    UPDATE 04/29/2015:  Starting in December, she began experiencing high-pitched sound in the right ear, neck burning, and imbalance.  Neck discomfort is constant and involves the right side, which is worse when at rest and improved with activity. Heat and cold compresses alleviates her discomfort.  She has not tried NSAIDs or tylenol.  She has noticed the high-pitched sound and imbalance about 4-5 times.    She endorses a significant amount of stress related to her father passing in August and having to manage her mother.   UPDATE 02/26/2016:  Starting around the summer of 2017, she began having achy pain over the base of the head on the right.  It is tender to touch, but she denies any electrical or shooting pain into her scalp.  She has not tried NSAIDs or flexeril for her pain.   UPDATE 09/01/2016:  She scheduled an  appointment today for ongoing right sided neck pain.  She complains of soreness at the base of her neck on the right which responds to Aleve, but does not take this often.  Pain is ranked as 3/10 and occurs about 5 times per week. This is exactly the same as the pain she was evaluated at her last visit.  She denies any tingling, burning, or sharp radiating pain.  She has a colonoscopy coming up and is  very nervous that her pain would cause any issue with her upcoming procedure.   Medications:  Current Outpatient Prescriptions on File Prior to Visit  Medication Sig Dispense Refill  . aspirin EC 81 MG tablet Take 1 tablet (81 mg total) by mouth daily.    . Cholecalciferol (VITAMIN D3) 1000 units CAPS Take 2,000 Units by mouth.    . ZORVOLEX 18 MG CAPS as needed.   1   No current facility-administered medications on file prior to visit.     Allergies: No Known Allergies  Review of Systems:  CONSTITUTIONAL: No fevers, chills, night sweats, or weight loss.  EYES: No visual changes or eye pain ENT: No hearing changes.  No history of nose bleeds.   RESPIRATORY: No cough, wheezing and shortness of breath.   CARDIOVASCULAR: Negative for chest pain, and palpitations.   GI: Negative for abdominal discomfort, blood in stools or black stools.  No recent change in bowel habits.   GU:  No history of incontinence.   MUSCLOSKELETAL: No history of joint pain or swelling.  No myalgias.   SKIN: Negative for lesions, rash, and itching.   ENDOCRINE: Negative for cold or heat intolerance, polydipsia or goiter.   PSYCH:  No depression or anxiety symptoms.   NEURO: As Above.   Vital Signs:  BP 110/70   Pulse 65   Ht 5' (1.524 m)   Wt 118 lb 9 oz (53.8 kg)   LMP 06/03/2014 (Approximate)   SpO2 98%   BMI 23.16 kg/m   Neurological Exam: MENTAL STATUS including orientation to time, place, person, recent and remote memory, attention span and concentration, language, and fund of knowledge is normal.  Speech is not dysarthric.  CRANIAL NERVES: Normal fundoscopic exam bilaterally. No visual field defects. Pupils equal round and reactive to light. No Horner's syndrome.  Normal conjugate, extra-ocular eye movements in all directions of gaze.  No ptosis. Normal facial sensation.  Face is symmetric. Palate elevates symmetrically.  Tongue is midline.  MOTOR:  Motor strength is 5/5 in all extremities.  Tone  is normal.  There is point tenderness over the right occipital base without radiation of pain  MSRs:  Reflexes are brisk and symmetric 3+/4 throughout.  SENSORY:  Intact to vibration.  COORDINATION/GAIT:  Normal finger-to- nose-finger.  Intact rapid alternating movements bilaterally.  Gait narrow based and stable. Tandem gait intact.  Data: Labs 08/23/2014:  CRP <0.1, ESR 12, ANA neg, HbA1c 5.5, LDL 55, HDL 76, C3/C4 normal, ENA neg, ANA neg, RF neg, hypercoagulable panel negative (normal protein C and S activity, no Factor V leiden mutation, normal antithrombin III and anticardiolipin antibody)  Surface echocardiogram 08/31/2014:  normal, EF 60% no thrombus  08/30/2014  48-hr holter:   No significant arrhythmias.  No atrial fib or etiology for an embolic neuro event.  Rare atrial and ventricular ectopy noted.  Predominant rhythm is sinus.     MRI brain 08/27/2014:  Subacute nonhemorrhagic small infarcts involving mid to inferior right cerebellum and medial aspect of the right parietal -  occipital lobe.  No intracranial hemorrhage.  MRA head 08/27/2014:  Ectatic cavernous segment internal carotid artery bilaterally. Mild narrowing petrous segment of the internal carotid artery bilaterally. Middle cerebral mild to moderate branch vessel narrowing and irregularity bilaterally greater on the right.   1.6 x 1.6 mm bulge M1 segment right middle cerebral artery appears to be origin of a vessel rather than saccular aneurysm. Stability can be confirmed on follow-up.   Moderate narrowing at the junction of the A1 segment and A2 segment of the right anterior cerebral artery.  Ectatic vertebral arteries and basilar artery without high-grade stenosis.  Full extent of the left posterior inferior cerebellar artery not imaged on present exam. This is noted on the contrast-enhanced MR angiogram of the neck and appears ectatic were proximal narrowing.  Nonvisualized left anterior inferior cerebellar artery.   Narrowing and irregularity superior cerebellar artery greater on the left.  Posterior cerebral artery branch vessel narrowing and irregularity greater on the left.   MRA NECK FINDINGS Ectatic pleated dilated appearance of the vertebral arteries bilaterally suggestive of fibromuscular dysplasia. With the patient's subacute right sided infarcts, subtle focal dissection with subsequent embolic thrombus cannot be entirely excluded although not detected on the current exam   No significant stenosis of either carotid bifurcation. Ectatic internal carotid artery cervical segment bilaterally may also reflect changes of fibromuscular dysplasia but without pleated appearance as noted involving the vertebral arteries.   MRA head 05/03/2015: 1. 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 No associated stenosis. The bilateral V4 segments appear spared. 2. Bilateral ICA dolichoectasia, without the cervical ICA beaded appearance typical of carotid FMD. No carotid stenosis. 3.  Negative intracranial MRA aside from ICA siphon tortuosity.  IMPRESSION/PLAN: 1.  Right cervical muscle spasm, mild  - There is no radiation of paresthesias or pain into her scalp, but she has localized tenderness over the right GON  - Offered nerve block, but since symptoms are not severe, she will try NSAID and flexeril 57m at bedtime  - Patient reassured that she may undergo colonoscopy with normal precautions  2.  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  Greater than 50% of this time was spent in counseling, explanation of diagnosis, planning of further management, and coordination of care.   Thank you for allowing me to participate in patient's care.  If I can answer any additional questions, I would be pleased to do so.    Sincerely,    Azlaan Isidore K. PaPosey ProntoDO

## 2016-09-01 NOTE — Patient Instructions (Signed)
1.  You can start to try ibuprofen or Aleve for your neck pain.  Limit this to twice per week 2.  For muscle tension, you can take flexeril 5mg  at bedtime

## 2016-09-16 ENCOUNTER — Ambulatory Visit: Payer: Managed Care, Other (non HMO) | Admitting: Family Medicine

## 2016-10-09 ENCOUNTER — Ambulatory Visit: Payer: Managed Care, Other (non HMO) | Admitting: Family Medicine

## 2016-11-25 ENCOUNTER — Ambulatory Visit: Payer: Managed Care, Other (non HMO) | Admitting: Neurology

## 2017-02-02 ENCOUNTER — Encounter: Payer: Self-pay | Admitting: Family Medicine

## 2017-02-02 ENCOUNTER — Ambulatory Visit (INDEPENDENT_AMBULATORY_CARE_PROVIDER_SITE_OTHER): Payer: Managed Care, Other (non HMO) | Admitting: Family Medicine

## 2017-02-02 VITALS — BP 110/84 | HR 71 | Ht 60.0 in | Wt 121.0 lb

## 2017-02-02 DIAGNOSIS — S76302A Unspecified injury of muscle, fascia and tendon of the posterior muscle group at thigh level, left thigh, initial encounter: Secondary | ICD-10-CM

## 2017-02-02 DIAGNOSIS — M999 Biomechanical lesion, unspecified: Secondary | ICD-10-CM

## 2017-02-02 NOTE — Patient Instructions (Signed)
Good to see you  Ice 20 minutes 2 times daily. Usually after activity and before bed. Eat within 30 minutes of working out.  Vega sport protein could be great  Exercises 3 times a week. Maybe after running Heel lift in the shoe may help ([email or web address]) See me again in 4-6 weeks

## 2017-02-02 NOTE — Assessment & Plan Note (Signed)
Decision today to treat with OMT was based on Physical Exam  After verbal consent patient was treated with HVLA, ME, FPR techniques in  thoracic, lumbar and sacral areas  Patient tolerated the procedure well with improvement in symptoms  Patient given exercises, stretches and lifestyle modifications  See medications in patient instructions if given  Patient will follow up in 4 weeks 

## 2017-02-02 NOTE — Assessment & Plan Note (Signed)
Patient is having more tightness of the left side. This is different than her previous exam. This is very similar to her previous injury that she hadn't 5 months ago. Patient is going to monitor her back. Has had lumbar radiculopathy previously. Patient declined any type of gabapentin at this point secondary to side effects of multiple different medications in the past. Patient given new exercises that I think will be beneficial. We discussed icing regimen. Responded well to osteopathic manipulation and follow-up again in 4 weeks.

## 2017-02-02 NOTE — Progress Notes (Addendum)
[name] [address] [place], [place] [address] Phone: [phone] Subjective:    Scribe by Ronelle Nigh, reviewed by [name]   CC:   [id number]  Rachel Golden is a 51 y.o. female coming in with complaint of left leg pain. She says her plantar fascia tightens up in a spasm. She says she has been running more consistently (12-16 miles a week) but has not changed her intensity. Hamstring feels like it is pulling.  Onset-  Location- Distal hamstring and achilles  Duration- Hamstring has more of a consistent pain Character- Sharp pain  Aggravating factors- Squating Reliving factors- Ice Therapies tried- Ice, stretching Severity-     Past Medical History:  Diagnosis Date  . Chronic back pain   . Stroke [facility])    Past Surgical History:  Procedure Laterality Date  . NASAL SEPTUM SURGERY     Social History   Social History  . Marital status: Married    Spouse name: N/A  . Number of children: N/A  . Years of education: N/A   Social History Main Topics  . Smoking status: Never Smoker  . Smokeless tobacco: Never Used  . Alcohol use 0.0 oz/week     Comment: 3-4 drinks per week, on occasion  . Drug use: No  . Sexual activity: Not Asked   Other Topics Concern  . None   Social History Narrative   Lives with husband and 2 children in a 2 story home.     Works as an Airline pilot part time.     Education: BS degree.   No Known Allergies Family History  Problem Relation Age of Onset  . Heart murmur Mother        Living, 70  . Alzheimer's disease Father        Living, 59  . Vascular Disease Brother   . Healthy Son   . Healthy Daughter      Past medical history, social, surgical and family history all reviewed in electronic medical record.  No pertanent information unless stated regarding to the chief complaint.   Review of Systems:Review of systems updated and as accurate as of 02/02/17  No headache, visual changes,  nausea, vomiting, diarrhea, constipation, dizziness, abdominal pain, skin rash, fevers, chills, night sweats, weight loss, swollen lymph nodes, body aches, joint swelling, , chest pain, shortness of breath, mood changes. Positive muscle aches  Objective  Blood pressure 110/84, pulse 71, height 5' (1.524 m), weight 121 lb (54.9 kg), last menstrual period 06/03/2014, SpO2 98 %. Systems examined below as of 02/02/17   General: No apparent distress alert and oriented x3 mood and affect normal, dressed appropriately.  HEENT: Pupils equal, extraocular movements intact  Respiratory: Patient's speak in full sentences and does not appear short of breath  Cardiovascular: No lower extremity edema, non tender, no erythema  Skin: Warm dry intact with no signs of infection or rash on extremities or on axial skeleton.  Abdomen: Soft nontender  Neuro: Cranial nerves II through XII are intact, neurovascularly intact in all extremities with 2+ DTRs and 2+ pulses.  Lymph: No lymphadenopathy of posterior or anterior cervical chain or axillae bilaterally.  Gait normal with good balance and coordination.  MSK:  Non tender with full range of motion and good stability and symmetric strength and tone of shoulders, elbows, wrist, hip, knee and ankles bilaterally.  Patient's back exam shows the patient does have tight hamstrings bilaterally left greater than right. Patient does have a  positive Pearlean BrownieFaber. Mild discomfort in the lower back. Patient does have full range of motion and full strength of the lower extremity is. Neurovascularly intact distally.  Osteopathic findings  T3 extended rotated and side bent right inhaled third rib T9 extended rotated and side bent left L4 flexed rotated and side bent right Sacrum right on right     Impression and Recommendations:     This case required medical decision making of moderate complexity.      Note: This dictation was prepared with Dragon dictation along with  smaller phrase technology. Any transcriptional errors that result from this process are unintentional.

## 2017-03-09 ENCOUNTER — Ambulatory Visit: Payer: Managed Care, Other (non HMO) | Admitting: Family Medicine

## 2017-03-23 NOTE — Progress Notes (Signed)
Tawana ScaleZach Koben Daman D.O. [facility] [address] [place], [place] [address] Phone: [phone] Subjective:    CC: Hamstring follow-up  BJY:[id number]:Subjective  Rachel Golden is a 51 y.o. female coming in with complaint of left hamstring.  Patient was found to have more of a tendinitis distally.  Patient has been doing home exercises but very minimal improvement.  Patient states that it seems to be radiating up the leg again.  Patient has had L4 nerve root on the left.  Patient denies all that and she feels like it is her back overall.  Patient continues to work out fairly much on a regular basis.  Looking at possibly getting more guidance on some of the exercises.  Low back pain seems to be doing stable.  Has responded well to osteopathic manipulation.      Past Medical History:  Diagnosis Date  . Chronic back pain   . Stroke [facility](HCC)    Past Surgical History:  Procedure Laterality Date  . NASAL SEPTUM SURGERY     Social History   Socioeconomic History  . Marital status: Married    Spouse name: None  . Number of children: None  . Years of education: None  . Highest education level: None  Social Needs  . Financial resource strain: None  . Food insecurity - worry: None  . Food insecurity - inability: None  . Transportation needs - medical: None  . Transportation needs - non-medical: None  Occupational History  . None  Tobacco Use  . Smoking status: Never Smoker  . Smokeless tobacco: Never Used  Substance and Sexual Activity  . Alcohol use: Yes    Alcohol/week: 0.0 oz    Comment: 3-4 drinks per week, on occasion  . Drug use: No  . Sexual activity: None  Other Topics Concern  . None  Social History Narrative   Lives with husband and 2 children in a 2 story home.     Works as an Airline pilotaccountant part time.     Education: BS degree.   No Known Allergies Family History  Problem Relation Age of Onset  . Heart murmur Mother        Living, 5679  . Alzheimer's disease Father     Living, 6880  . Vascular Disease Brother   . Healthy Son   . Healthy Daughter      Past medical history, social, surgical and family history all reviewed in electronic medical record.  No pertanent information unless stated regarding to the chief complaint.   Review of Systems:Review of systems updated and as accurate as of 03/24/17  No headache, visual changes, nausea, vomiting, diarrhea, constipation, dizziness, abdominal pain, skin rash, fevers, chills, night sweats, weight loss, swollen lymph nodes, body aches, joint swelling, chest pain, shortness of breath, mood changes.  Mild positive muscle aches  Objective  Blood pressure 114/80, pulse 75, height 5' (1.524 m), weight 120 lb (54.4 kg), last menstrual period 06/03/2014, SpO2 94 %. Systems examined below as of 03/24/17   General: No apparent distress alert and oriented x3 mood and affect normal, dressed appropriately.  HEENT: Pupils equal, extraocular movements intact  Respiratory: Patient's speak in full sentences and does not appear short of breath  Cardiovascular: No lower extremity edema, non tender, no erythema  Skin: Warm dry intact with no signs of infection or rash on extremities or on axial skeleton.  Abdomen: Soft nontender  Neuro: Cranial nerves II through XII are intact, neurovascularly intact in all extremities with 2+  DTRs and 2+ pulses.  Lymph: No lymphadenopathy of posterior or anterior cervical chain or axillae bilaterally.  Gait normal with good balance and coordination.  MSK:  Non tender with full range of motion and good stability and symmetric strength and tone of shoulders, elbows, wrist, hip, knee and ankles bilaterally.  Left knee there is no shows patient still has tenderness over the medial tendon of the hamstring distally.  Positive pain with resisted flexion of the knee.  Good stability of the knee are noted.  Back exam shows the patient does have increasing tightness of the hip flexors bilaterally right  greater than left.  Positive Faber on the right.  More discomfort over the right sacroiliac joint.  Near full range of motion of the back still.  Full strength of the lower extremities.  Osteopathic findings T6 extended rotated and side bent left L2 flexed rotated and side bent right Sacrum right on right     Impression and Recommendations:     This case required medical decision making of moderate complexity.      Note: This dictation was prepared with Dragon dictation along with smaller phrase technology. Any transcriptional errors that result from this process are unintentional.

## 2017-03-24 ENCOUNTER — Encounter: Payer: Self-pay | Admitting: Family Medicine

## 2017-03-24 ENCOUNTER — Ambulatory Visit: Payer: Managed Care, Other (non HMO) | Admitting: Family Medicine

## 2017-03-24 VITALS — BP 114/80 | HR 75 | Ht 60.0 in | Wt 120.0 lb

## 2017-03-24 DIAGNOSIS — S76302A Unspecified injury of muscle, fascia and tendon of the posterior muscle group at thigh level, left thigh, initial encounter: Secondary | ICD-10-CM | POA: Diagnosis not present

## 2017-03-24 DIAGNOSIS — M999 Biomechanical lesion, unspecified: Secondary | ICD-10-CM | POA: Diagnosis not present

## 2017-03-24 MED ORDER — PREDNISONE 50 MG PO TABS: 50.0000 mg | ORAL_TABLET | Freq: Every day | ORAL | 0 refills | Status: DC

## 2017-03-24 NOTE — Assessment & Plan Note (Signed)
Decision today to treat with OMT was based on Physical Exam  After verbal consent patient was treated with HVLA, ME, FPR techniques in  thoracic, lumbar and sacral areas  Patient tolerated the procedure well with improvement in symptoms  Patient given exercises, stretches and lifestyle modifications  See medications in patient instructions if given  Patient will follow up in 4-6 weeks 

## 2017-03-24 NOTE — Patient Instructions (Signed)
Good to see you  PT will be calling you  Ice is your friend Ask her about KT tape for it with the compression sleeves not working.  Prednisone daily for 5 days in case it is from your back  See me again in 4 weeks Happy New Year!

## 2017-03-24 NOTE — Assessment & Plan Note (Signed)
Patient will be referred to formal physical therapy.  We discussed icing regimen, patient has not been doing the compression sleeve but could do well with the KT taping.  Patient will follow up with me again 4 weeks

## 2017-04-14 ENCOUNTER — Other Ambulatory Visit: Payer: Self-pay

## 2017-04-14 ENCOUNTER — Ambulatory Visit: Payer: Managed Care, Other (non HMO) | Attending: Family Medicine | Admitting: Physical Therapy

## 2017-04-14 DIAGNOSIS — M25562 Pain in left knee: Secondary | ICD-10-CM | POA: Diagnosis present

## 2017-04-14 DIAGNOSIS — M6281 Muscle weakness (generalized): Secondary | ICD-10-CM

## 2017-04-14 DIAGNOSIS — R29898 Other symptoms and signs involving the musculoskeletal system: Secondary | ICD-10-CM

## 2017-04-14 NOTE — Therapy (Signed)
[facility] [place] [address]  [address] [place], [place], [address] Phone: [phone]   Fax:  [phone]  Physical Therapy Evaluation  Patient Details  Name: Rachel Golden MRN: [id number] Date of Birth: 12/13/1965 Referring Provider: Antoine PrimasZachary Smith, DO   Encounter Date: 04/14/2017  PT End of Session - 04/14/17 1050    Visit Number  1    Number of Visits  8    Date for PT Re-Evaluation  05/14/17    Authorization Type  [employer] - VL: [place]    PT Start Time  1050    PT Stop Time  1145    PT Time Calculation (min)  55 min    Activity Tolerance  Patient tolerated treatment well    Behavior During Therapy  [facility] for tasks assessed/performed       Past Medical History:  Diagnosis Date  . Chronic back pain   . Stroke [facility](HCC)     Past Surgical History:  Procedure Laterality Date  . NASAL SEPTUM SURGERY      There were no vitals filed for this visit.   Subjective Assessment - 04/14/17 1053    Subjective  Pt reports 25+ yr h/o back problems but now having pain in back of L knee. States MD feels the the joint integrity is good and problem is more muscular, questionably hamstring vs ITB. Generally does not hurt while running, but want to make sure she can keep up with running.    Limitations  Sitting;Standing    How long can you sit comfortably?  1 hr    How long can you stand comfortably?  1 hr    Patient Stated Goals  "to be able to stay active and improve flexibility"    Currently in Pain?  Yes    Pain Score  3     Pain Location  Knee    Pain Orientation  Left;Posterior    Pain Descriptors / Indicators  Sharp "tweeking" (twinge)    Pain Type  Acute pain    Pain Radiating Towards  n/a    Pain Onset  More than a month ago ~3 months    Pain Frequency  Intermittent    Aggravating Factors   sudden movements, esp stop & go or pivoting    Pain Relieving Factors  ice, stretching    Effect of Pain on Daily Activities  limits sitting and  standing tolerance    Multiple Pain Sites  Yes    Pain Score  3    Pain Location  Back    Pain Orientation  Left;Lower    Pain Descriptors / Indicators  Dull    Pain Type  Chronic pain    Pain Radiating Towards  n/a    Pain Onset  More than a month ago    Pain Frequency  Intermittent         [facility] PT Assessment - 04/14/17 1050      Assessment   Medical Diagnosis  L hamstring injury    Referring Provider  Antoine PrimasZachary Smith, DO    Onset Date/Surgical Date  -- ~ 3 months    Next MD Visit  04/21/17    Prior Therapy  none for current condition      Balance Screen   Has the patient fallen in the past 6 months  No    Has the patient had a decrease in activity level because of a fear of falling?   No  Is the patient reluctant to leave their home because of a fear of falling?   No      Home Nurse, mental health  Private residence    Living Arrangements  Spouse/significant other;Children    Type of Home  House    Home Access  Stairs to enter    Entrance Stairs-Number of Steps  6    Entrance Stairs-Rails  Right;Left;Cannot reach both    Home Layout  One level      Prior Function   Level of Independence  Independent    Vocation  Part time employment    Pensions consultant - mostly deskwork    Leisure  running 3x/wk, gym 3x/wk, walking      Observation/Other Assessments   Focus on Therapeutic Outcomes (FOTO)   Knee - 62% (38% limitation); predicted 69% (31% limitation) in 9 visits      ROM / Strength   AROM / PROM / Strength  AROM;PROM;Strength      AROM   AROM Assessment Site  Knee    Right/Left Knee  Right;Left    Right Knee Extension  0    Right Knee Flexion  150    Left Knee Extension  6    Left Knee Flexion  151      PROM   PROM Assessment Site  Knee    Right/Left Knee  Left    Left Knee Extension  0      Strength   Strength Assessment Site  Knee;Hip    Right/Left Hip  Right;Left    Right Hip Flexion  4/5    Right Hip Extension  4-/5     Right Hip External Rotation   4-/5    Right Hip Internal Rotation  4-/5    Right Hip ABduction  4+/5    Right Hip ADduction  4/5    Left Hip Flexion  4/5    Left Hip Extension  3+/5    Left Hip External Rotation  3+/5    Left Hip Internal Rotation  4-/5    Left Hip ABduction  4/5    Left Hip ADduction  3+/5    Right/Left Knee  Right;Left    Right Knee Flexion  5/5    Right Knee Extension  5/5    Left Knee Flexion  4/5    Left Knee Extension  4+/5      Flexibility   Soft Tissue Assessment /Muscle Length  yes    Hamstrings  mod tight L>R    Quadriceps  mild tight RF + mild/mod tight hip flexors    ITB  mod tight L>R    Piriformis  mod tight L>R             Objective measurements completed on examination: See above findings.      [facility] Adult PT Treatment/Exercise - 04/14/17 1050      Exercises   Exercises  Knee/Hip      Knee/Hip Exercises: Stretches   Passive Hamstring Stretch  Left;30 seconds;1 rep each    Passive Hamstring Stretch Limitations  supine with strap - neutral + gastroc & isolating medial/lateral HS    Hip Flexor Stretch  Left;30 seconds;2 reps    Hip Flexor Stretch Limitations  mod thomas with strap & 1/2 kneel lunge    ITB Stretch  Left;30 seconds;1 rep    ITB Stretch Limitations  supine with strap     Piriformis Stretch  Left;30 seconds;1 rep each  Piriformis Stretch Limitations  figure 4 to chest & KTOS             PT Education - 04/14/17 1145    Education provided  Yes    Education Details  PT eval findings, anticipated POC & initial HEP    Person(s) Educated  Patient    Methods  Explanation;Demonstration;Handout;Verbal cues;Tactile cues    Comprehension  Verbalized understanding;Returned demonstration;Verbal cues required;Tactile cues required;Need further instruction          PT Long Term Goals - 04/14/17 1145      PT LONG TERM GOAL #1   Title  Independent with ongoing HEP +/- gym program    Status  New    Target Date   05/14/17      PT LONG TERM GOAL #2   Title  Pt will demontrate improved LE flexibility to allow for L knee extension AROM in open chain to 0 dg    Status  New    Target Date  05/14/17      PT LONG TERM GOAL #3   Title  B LE hip and knee strength >/= 4/5 for improved function    Status  New    Target Date  05/14/17      PT LONG TERM GOAL #4   Title  Pt will report ability to participate in desired exercise and recreational activities w/o limitation due to L hamstring pain    Status  New    Target Date  05/14/17             Plan - 04/14/17 1145    Clinical Impression Statement  Rachel Golden is a 52 y/o active female who presents to OP PT for L distal lateral hamstring pain of ~3-4 months duration. Pain 3/10 on eval, with pt reporting pain most commonly triggered by sudden movements, esp stop and go or pivoting, but not typically an issue when she is running. R knee extension mildly limited in open chain due to pain but full extension present when LE supported. Decreased LE flexibility also noted, esp in hamstrings, ITB & hip flexors, with mild strength deficits noted L vs R. Pain, impaired flexibility and strength deficits limit positional tolerance in sitting and standing as well as participation in exercise and recreational activities. Pt will benefit from skilled PT to address deficits listed.    History and Personal Factors relevant to plan of care:  chronic LBP x 25+ yrs; CVA x 2 with no apparent residual deficits    Clinical Presentation  Stable    Clinical Decision Making  Low    Rehab Potential  Good    Clinical Impairments Affecting Rehab Potential  chronic LBP x 25+ yrs    PT Frequency  2x / week    PT Duration  4 weeks    PT Treatment/Interventions  Patient/family education;ADLs/Self Care Home Management;Therapeutic exercise;Therapeutic activities;Neuromuscular re-education;Balance training;Manual techniques;Dry needling;Taping;Passive range of motion;Electrical  Stimulation;Cryotherapy;Vasopneumatic Device;Moist Heat;Iontophoresis 4mg /ml Dexamethasone;Ultrasound    Consulted and Agree with Plan of Care  Patient       Patient will benefit from skilled therapeutic intervention in order to improve the following deficits and impairments:  Pain, Impaired flexibility, Decreased range of motion, Decreased strength, Increased muscle spasms, Decreased activity tolerance, Difficulty walking  Visit Diagnosis: Acute pain of left knee  Other symptoms and signs involving the musculoskeletal system  Muscle weakness (generalized)     Problem List Patient Active Problem List   Diagnosis Date Noted  . Hamstring injury, left,  initial encounter 02/02/2017  . Hamstring injury, right, initial encounter 08/12/2016  . Viral upper respiratory infection 03/27/2016  . Trigger point of left shoulder region 07/02/2015  . Abnormality of gait 02/13/2015  . Lumbar radiculopathy, acute 12/04/2014  . 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 Fibromuscular dysplasia (HCC) 08/29/2014  . Acute headache 08/23/2014  . Trigger point of right shoulder region 08/23/2014  . Neck pain 08/14/2014  . Nonallopathic lesion of sacral region 05/01/2014  . Hip flexor tightness 09/06/2013  . Nonallopathic lesion of thoracic region 12/13/2012  . Lumbar back pain 05/17/2012  . Leg length discrepancy 05/17/2012  . Nonallopathic lesion of lumbosacral region 05/17/2012    Marry Guan, PT, MPT 04/14/2017, 1:41 PM  [facility] [address]  [address] [place], [place], [address] Phone: [phone]   Fax:  [phone]  Name: Rachel Golden MRN: [id number] Date of Birth: 09/07/1965

## 2017-04-21 ENCOUNTER — Ambulatory Visit: Payer: Managed Care, Other (non HMO)

## 2017-04-21 ENCOUNTER — Encounter: Payer: Self-pay | Admitting: Family Medicine

## 2017-04-21 ENCOUNTER — Ambulatory Visit: Payer: Self-pay

## 2017-04-21 ENCOUNTER — Ambulatory Visit: Payer: Managed Care, Other (non HMO) | Admitting: Family Medicine

## 2017-04-21 VITALS — BP 92/70 | HR 78 | Ht 60.0 in | Wt 121.0 lb

## 2017-04-21 DIAGNOSIS — M79604 Pain in right leg: Secondary | ICD-10-CM | POA: Diagnosis not present

## 2017-04-21 DIAGNOSIS — S76302A Unspecified injury of muscle, fascia and tendon of the posterior muscle group at thigh level, left thigh, initial encounter: Secondary | ICD-10-CM | POA: Diagnosis not present

## 2017-04-21 DIAGNOSIS — M545 Low back pain, unspecified: Secondary | ICD-10-CM

## 2017-04-21 DIAGNOSIS — M999 Biomechanical lesion, unspecified: Secondary | ICD-10-CM

## 2017-04-21 NOTE — Patient Instructions (Addendum)
Good to see you  Rachel Golden is your friend. [email or web address] thigh compression size medium  [employer] sport  Stay active.  Continue the exercises Neck will take time but would try more ice then heat  See me again in 3-5 weeks maybe right after the race

## 2017-04-21 NOTE — Assessment & Plan Note (Signed)
Decision today to treat with OMT was based on Physical Exam  After verbal consent patient was treated with HVLA, ME, FPR techniques in  thoracic, lumbar and sacral areas  Patient tolerated the procedure well with improvement in symptoms  Patient given exercises, stretches and lifestyle modifications  See medications in patient instructions if given  Patient will follow up in 4-6 weeks 

## 2017-04-21 NOTE — Assessment & Plan Note (Signed)
Still having difficulty.  I do feeling patient should improve that with conservative therapy.  Encourage diet compression, patient declined formal physical therapy at this time.  Patient is working on a regular basis and we discussed certain activities could cause more of an exacerbation.  Patient will come back and see me again 4 weeks.

## 2017-04-21 NOTE — Progress Notes (Signed)
[name] [address] [place], [place] [address] Phone: [phone] Subjective:     CC: Back pain follow-up  [id number]  Rachel Golden is a 52 y.o. female coming in with complaint of found to have some back pain.  Has responded fairly well to osteopathic manipulation.  Patient though did have an hamstring injury on the left side.  More pain over the sacroiliac joint as well.  Possible concern for more of a sciatica as well.  Patient went to physical therapy one time since we have seen patient.  Initially given 5-day burst of prednisone. Patient did not take prednisone. Patient states that her back is doing fine.   She said that her hamstring is the biggest complaint. She said that she has a soreness in the medial hamstring tendon. She has pain with walking but not working out. She said that she has been going to physical therapy. She has been doing yoga and HIIT class in addition to running 3 days a week.   She did have an MRI done of the back in December 11, 2014.  Found to have L3-L4 lateral recess disc extrusion affecting the left L4 nerve root.  Had epidurals done December 21, 2014 and January 08, 2015.    Past Medical History:  Diagnosis Date  . Chronic back pain   . Stroke [facility])    Past Surgical History:  Procedure Laterality Date  . NASAL SEPTUM SURGERY     Social History   Socioeconomic History  . Marital status: Married    Spouse name: Not on file  . Number of children: Not on file  . Years of education: Not on file  . Highest education level: Not on file  Social Needs  . Financial resource strain: Not on file  . Food insecurity - worry: Not on file  . Food insecurity - inability: Not on file  . Transportation needs - medical: Not on file  . Transportation needs - non-medical: Not on file  Occupational History  . Not on file  Tobacco Use  . Smoking status: Never Smoker  . Smokeless tobacco: Never Used  Substance and Sexual  Activity  . Alcohol use: Yes    Alcohol/week: 0.0 oz    Comment: 3-4 drinks per week, on occasion  . Drug use: No  . Sexual activity: Not on file  Other Topics Concern  . Not on file  Social History Narrative   Lives with husband and 2 children in a 2 story home.     Works as an Airline pilot part time.     Education: BS degree.   No Known Allergies Family History  Problem Relation Age of Onset  . Heart murmur Mother        Living, 6  . Alzheimer's disease Father        Living, 16  . Vascular Disease Brother   . Healthy Son   . Healthy Daughter      Past medical history, social, surgical and family history all reviewed in electronic medical record.  No pertanent information unless stated regarding to the chief complaint.   Review of Systems:Review of systems updated and as accurate as of 04/21/17  No headache, visual changes, nausea, vomiting, diarrhea, constipation, dizziness, abdominal pain, skin rash, fevers, chills, night sweats, weight loss, swollen lymph nodes, body aches, joint swelling, muscle aches, chest pain, shortness of breath, mood changes.   Objective  Blood pressure 92/70, pulse 78, height 5' (1.524 m), weight  121 lb (54.9 kg), last menstrual period 06/03/2014, SpO2 98 %. Systems examined below as of 04/21/17   General: No apparent distress alert and oriented x3 mood and affect normal, dressed appropriately.  HEENT: Pupils equal, extraocular movements intact  Respiratory: Patient's speak in full sentences and does not appear short of breath  Cardiovascular: No lower extremity edema, non tender, no erythema  Skin: Warm dry intact with no signs of infection or rash on extremities or on axial skeleton.  Abdomen: Soft nontender  Neuro: Cranial nerves II through XII are intact, neurovascularly intact in all extremities with 2+ DTRs and 2+ pulses.  Lymph: No lymphadenopathy of posterior or anterior cervical chain or axillae bilaterally.  Gait normal with good  balance and coordination.  MSK:  Non tender with full range of motion and good stability and symmetric strength and tone of shoulders, elbows, wrist, hip, knee and ankles bilaterally.  Back exam still shows the patient does have tightness of the lumbar spine.  Mild positive Pearlean BrownieFaber on the right.  Neurovascularly intact distally.  Mild degenerative scoliosis noted Left leg exam though shows the patient is severely tender to palpation over the distal aspect of the medial hamstring tendon.  No defect noted.  Knee exam has full range of motion with very mild crepitus.  No instability.  Osteopathic findings T5 extended rotated and side bent right inhaled rib T7 extended rotated and side bent left L3 flexed rotated and side bent right Sacrum right on right  Limited musculoskeletal ultrasound was performed and interpreted by Judi SaaZachary M Cline Draheim  Limited ultrasound of patient's left hamstring distally shows the patient does have some scar tissue formation just proximal to where it intersects with the knee joint.  No increasing Doppler flow, mild hypoechoic changes.  Degenerative meniscal tear noted in the area. Impression: Scar tissue of a hamstring tendon injury.    Impression and Recommendations:     This case required medical decision making of moderate complexity.      Note: This dictation was prepared with Dragon dictation along with smaller phrase technology. Any transcriptional errors that result from this process are unintentional.

## 2017-04-21 NOTE — Assessment & Plan Note (Signed)
Stable.  We discussed with patient again at great length.  We discussed which activities to do which wants to avoid.  Patient is going to try to increase activity slowly.  Follow-up with me again 4-6 weeks

## 2017-04-23 ENCOUNTER — Ambulatory Visit: Payer: Managed Care, Other (non HMO)

## 2017-04-23 DIAGNOSIS — M6281 Muscle weakness (generalized): Secondary | ICD-10-CM

## 2017-04-23 DIAGNOSIS — R29898 Other symptoms and signs involving the musculoskeletal system: Secondary | ICD-10-CM

## 2017-04-23 DIAGNOSIS — M25562 Pain in left knee: Secondary | ICD-10-CM

## 2017-04-23 NOTE — Therapy (Signed)
[facility] [address]  [address] [place], [place], [address] Phone: [phone]   Fax:  [phone]  Physical Therapy Treatment  Patient Details  Name: Rachel Golden MRN: [id number] Date of Birth: 01-03-1966 Referring Provider: Antoine Primas, DO   Encounter Date: 04/23/2017  PT End of Session - 04/23/17 1024    Visit Number  2    Number of Visits  8    Date for PT Re-Evaluation  05/14/17    Authorization Type  [employer] - [place]: [place]    PT Start Time  1018    PT Stop Time  1113    PT Time Calculation (min)  55 min    Activity Tolerance  Patient tolerated treatment well    Behavior During Therapy  [facility] for tasks assessed/performed       Past Medical History:  Diagnosis Date  . Chronic back pain   . Stroke [facility])     Past Surgical History:  Procedure Laterality Date  . NASAL SEPTUM SURGERY      Vitals:    Subjective Assessment - 04/23/17 1026    Subjective  Saw MD - reports diagnosed with partial tear of hamstring.  Said MD wants her to continue with therapy.      Patient Stated Goals  "to be able to stay active and improve flexibility"    Currently in Pain?  Yes    Pain Score  3     Pain Location  Knee    Pain Orientation  Left;Posterior    Pain Descriptors / Indicators  -- "Strain"    Pain Type  Acute pain    Pain Frequency  Intermittent    Aggravating Factors   stop and go or pivoting    Multiple Pain Sites  No                      [facility] Adult PT Treatment/Exercise - 04/23/17 1033      Self-Care   Self-Care  Other Self-Care Comments    Other Self-Care Comments   discussed need for consistent stretching, importance of proper warmup, and importance of avoiding strenous activities that may compromise proper healing; Discussed and demo'd appropriate "core" activities in supine with dead bug and hip flexion isometrics upon pt. request that would avoid excessive HS strain       Knee/Hip Exercises:  Stretches   Passive Hamstring Stretch  Left;30 seconds;1 rep    Passive Hamstring Stretch Limitations  supine with strap - neutral + gastroc & isolating medial/lateral HS    Hip Flexor Stretch  Left;30 seconds;2 reps    Hip Flexor Stretch Limitations  mod thomas with strap & 1/2 kneel lunge    ITB Stretch  Left;30 seconds;1 rep    ITB Stretch Limitations  supine with strap     Piriformis Stretch  Left;30 seconds;1 rep    Piriformis Stretch Limitations  figure 4 to chest & KTOS      Knee/Hip Exercises: Aerobic   Nustep  NuStep: lvl 2, 7 min       Knee/Hip Exercises: Supine   Other Supine Knee/Hip Exercises  B HS curl with heels on peanut p-ball x 15 reps     Other Supine Knee/Hip Exercises  Dead bug (3" x 5 reps each), hip flexion isometrics (5" x 5 reps) reviewed upon pt. request       Modalities   Modalities  Vasopneumatic      Vasopneumatic  Number Minutes Vasopneumatic   15 minutes    Vasopnuematic Location   Knee L    Vasopneumatic Pressure  Medium    Vasopneumatic Temperature   medium      Manual Therapy   Manual Therapy  Soft tissue mobilization;Passive ROM    Manual therapy comments  supine and prone     Soft tissue mobilization  STM to L lateral/medial HS    Passive ROM  Manual L HS (med/lateral), glute, piriformis stretch with therapist                   PT Long Term Goals - 04/23/17 1025      PT LONG TERM GOAL #1   Title  Independent with ongoing HEP +/- gym program    Status  On-going      PT LONG TERM GOAL #2   Title  Pt will demontrate improved LE flexibility to allow for L knee extension AROM in open chain to 0 dg    Status  On-going      PT LONG TERM GOAL #3   Title  B LE hip and knee strength >/= 4/5 for improved function    Status  On-going      PT LONG TERM GOAL #4   Title  Pt will report ability to participate in desired exercise and recreational activities w/o limitation due to L hamstring pain    Status  On-going             Plan - 04/23/17 1025    Clinical Impression Statement  Had MD f/u on 1.16.  Reports MD diagnosed her with partial hamstring tear however wants her to continue with therapy with conservative measures.  Has been doing well.  HEP reviewed with pt. today with some cueing required for LE musculature relaxation to ensure proper stretch.  Pt. unsure of appropriate "core activities" to perform at gym as to avoid HS strain thus reviewed Dead bug and hip flexion isometric activities with pt. today which were performed pain free.  Tolerated all LE stretching, gentle strengthening and STM well today in session ending treatment pain free.  Will continue to progress toward goals in coming visits.      PT Treatment/Interventions  Patient/family education;ADLs/Self Care Home Management;Therapeutic exercise;Therapeutic activities;Neuromuscular re-education;Balance training;Manual techniques;Dry needling;Taping;Passive range of motion;Electrical Stimulation;Cryotherapy;Vasopneumatic Device;Moist Heat;Iontophoresis 4mg /ml Dexamethasone;Ultrasound    Consulted and Agree with Plan of Care  Patient       Patient will benefit from skilled therapeutic intervention in order to improve the following deficits and impairments:  Pain, Impaired flexibility, Decreased range of motion, Decreased strength, Increased muscle spasms, Decreased activity tolerance, Difficulty walking  Visit Diagnosis: Acute pain of left knee  Other symptoms and signs involving the musculoskeletal system  Muscle weakness (generalized)     Problem List Patient Active Problem List   Diagnosis Date Noted  . Hamstring injury, left, initial encounter 02/02/2017  . Hamstring injury, right, initial encounter 08/12/2016  . Viral upper respiratory infection 03/27/2016  . Trigger point of left shoulder region 07/02/2015  . Abnormality of gait 02/13/2015  . Lumbar radiculopathy, acute 12/04/2014  . Arterial ischemic stroke (HCC)  08/29/2014  . Fibromuscular dysplasia (HCC) 08/29/2014  . Acute headache 08/23/2014  . Trigger point of right shoulder region 08/23/2014  . Neck pain 08/14/2014  . Nonallopathic lesion of sacral region 05/01/2014  . Hip flexor tightness 09/06/2013  . Nonallopathic lesion of thoracic region 12/13/2012  . Lumbar back pain 05/17/2012  . Leg length  discrepancy 05/17/2012  . Nonallopathic lesion of lumbosacral region 05/17/2012    Kermit BaloMicah Mckinzy Fuller, PTA 04/23/17 12:17 PM  [facility] [place] [address]  [address] [place], [place], [address] Phone: [phone]   Fax:  [phone]  Name: Rachel Golden MRN: [id number] Date of Birth: 05/11/1965

## 2017-04-23 NOTE — Therapy (Addendum)
[facility] [place] [address]  [address] [place], [place], [address] Phone: [phone]   Fax:  [phone]  Physical Therapy Treatment  Patient Details  Name: Rachel Ormondndrea Livers MRN: [id number] Date of Birth: 11/29/1965 Referring Provider: Antoine PrimasZachary Smith, DO   Encounter Date: 04/23/2017  PT End of Session - 04/23/17 1024    Visit Number  2    Number of Visits  8    Date for PT Re-Evaluation  05/14/17    Authorization Type  [employer]ign[employer] - VL: [place]    PT Start Time  1018    PT Stop Time  1113    PT Time Calculation (min)  55 min    Activity Tolerance  Patient tolerated treatment well    Behavior During Therapy  [facility] for tasks assessed/performed       Past Medical History:  Diagnosis Date  . Chronic back pain   . Stroke [facility](HCC)     Past Surgical History:  Procedure Laterality Date  . NASAL SEPTUM SURGERY      Vitals:    Subjective Assessment - 04/23/17 1026    Subjective  Saw MD - reports diagnosed with partial tear of hamstring.  Said MD wants her to continue with therapy.      Patient Stated Goals  "to be able to stay active and improve flexibility"    Currently in Pain?  Yes    Pain Score  3     Pain Location  Knee    Pain Orientation  Left;Posterior    Pain Descriptors / Indicators  -- "Strain"    Pain Type  Acute pain    Pain Frequency  Intermittent    Aggravating Factors   stop and go or pivoting    Multiple Pain Sites  No                      [facility] Adult PT Treatment/Exercise - 04/23/17 1033      Self-Care   Self-Care  Other Self-Care Comments    Other Self-Care Comments   discussed need for consistent stretching, importance of proper warmup, and importance of avoiding strenous activities that may compromise proper healing        Knee/Hip Exercises: Stretches   Passive Hamstring Stretch  Left;30 seconds;1 rep    Passive Hamstring Stretch Limitations  supine with strap - neutral + gastroc & isolating  medial/lateral HS    Hip Flexor Stretch  Left;30 seconds;2 reps    Hip Flexor Stretch Limitations  mod thomas with strap & 1/2 kneel lunge    ITB Stretch  Left;30 seconds;1 rep    ITB Stretch Limitations  supine with strap     Piriformis Stretch  Left;30 seconds;1 rep    Piriformis Stretch Limitations  figure 4 to chest & KTOS      Knee/Hip Exercises: Aerobic   Nustep  NuStep: lvl 2, 7 min       Knee/Hip Exercises: Supine   Other Supine Knee/Hip Exercises  B HS curl with heels on peanut p-ball x 15 reps       Modalities   Modalities  Vasopneumatic      Vasopneumatic   Number Minutes Vasopneumatic   15 minutes    Vasopnuematic Location   Knee L    Vasopneumatic Pressure  Medium    Vasopneumatic Temperature   medium      Manual Therapy   Manual Therapy  Soft tissue mobilization;Passive ROM  Manual therapy comments  supine and prone     Soft tissue mobilization  STM to L lateral/medial HS    Passive ROM  Manual L HS (med/lateral), glute, piriformis stretch with therapist                   PT Long Term Goals - 04/23/17 1025      PT LONG TERM GOAL #1   Title  Independent with ongoing HEP +/- gym program    Status  On-going      PT LONG TERM GOAL #2   Title  Pt will demontrate improved LE flexibility to allow for L knee extension AROM in open chain to 0 dg    Status  On-going      PT LONG TERM GOAL #3   Title  B LE hip and knee strength >/= 4/5 for improved function    Status  On-going      PT LONG TERM GOAL #4   Title  Pt will report ability to participate in desired exercise and recreational activities w/o limitation due to L hamstring pain    Status  On-going            Plan - 04/23/17 1025    Clinical Impression Statement  d    PT Treatment/Interventions  Patient/family education;ADLs/Self Care Home Management;Therapeutic exercise;Therapeutic activities;Neuromuscular re-education;Balance training;Manual techniques;Dry needling;Taping;Passive range  of motion;Electrical Stimulation;Cryotherapy;Vasopneumatic Device;Moist Heat;Iontophoresis 4mg /ml Dexamethasone;Ultrasound    Consulted and Agree with Plan of Care  Patient       Patient will benefit from skilled therapeutic intervention in order to improve the following deficits and impairments:  Pain, Impaired flexibility, Decreased range of motion, Decreased strength, Increased muscle spasms, Decreased activity tolerance, Difficulty walking  Visit Diagnosis: Acute pain of left knee  Other symptoms and signs involving the musculoskeletal system  Muscle weakness (generalized)     Problem List Patient Active Problem List   Diagnosis Date Noted  . Hamstring injury, left, initial encounter 02/02/2017  . Hamstring injury, right, initial encounter 08/12/2016  . Viral upper respiratory infection 03/27/2016  . Trigger point of left shoulder region 07/02/2015  . Abnormality of gait 02/13/2015  . Lumbar radiculopathy, acute 12/04/2014  . 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 Fibromuscular dysplasia (HCC) 08/29/2014  . Acute headache 08/23/2014  . Trigger point of right shoulder region 08/23/2014  . Neck pain 08/14/2014  . Nonallopathic lesion of sacral region 05/01/2014  . Hip flexor tightness 09/06/2013  . Nonallopathic lesion of thoracic region 12/13/2012  . Lumbar back pain 05/17/2012  . Leg length discrepancy 05/17/2012  . Nonallopathic lesion of lumbosacral region 05/17/2012    Kermit Balo 04/23/2017, 12:07 PM  [facility] [address]  [address] [place], [place], [address] Phone: [phone]   Fax:  [phone]  Name: Kendy Haston MRN: [id number] Date of Birth: 03-06-1966

## 2017-04-27 ENCOUNTER — Ambulatory Visit: Payer: Managed Care, Other (non HMO)

## 2017-04-27 DIAGNOSIS — R29898 Other symptoms and signs involving the musculoskeletal system: Secondary | ICD-10-CM

## 2017-04-27 DIAGNOSIS — M6281 Muscle weakness (generalized): Secondary | ICD-10-CM

## 2017-04-27 DIAGNOSIS — M25562 Pain in left knee: Secondary | ICD-10-CM

## 2017-04-27 NOTE — Therapy (Signed)
[facility] [place] [address]  [address] [place], [place], [address] Phone: [phone]   Fax:  [phone]  Physical Therapy Treatment  Patient Details  Name: Rachel Ormondndrea Castiglia MRN: [id number] Date of Birth: 10/22/1965 Referring Provider: Antoine PrimasZachary Smith, DO   Encounter Date: 04/27/2017  PT End of Session - 04/27/17 0931    Visit Number  3    Number of Visits  8    Date for PT Re-Evaluation  05/14/17    Authorization Type  [employer]ign[employer] - VL: [place]    PT Start Time  0926    PT Stop Time  1025    PT Time Calculation (min)  59 min    Activity Tolerance  Patient tolerated treatment well    Behavior During Therapy  [facility] for tasks assessed/performed       Past Medical History:  Diagnosis Date  . Chronic back pain   . Stroke [facility](HCC)     Past Surgical History:  Procedure Laterality Date  . NASAL SEPTUM SURGERY      There were no vitals filed for this visit.  Subjective Assessment - 04/27/17 0930    Subjective  Reports she performed Zomba and ran 7 miles yesterday without issue.      Limitations  Sitting;Standing    Patient Stated Goals  "to be able to stay active and improve flexibility"    Currently in Pain?  No/denies    Pain Score  0-No pain    Multiple Pain Sites  No                      [facility] Adult PT Treatment/Exercise - 04/27/17 0949      Self-Care   Self-Care  Other Self-Care Comments    Other Self-Care Comments   Reviewed use of "roller stick" to L HS area for gentle massage as pt. has this at home with cueing provided for proper duration and need for "gentle" pressure       Knee/Hip Exercises: Aerobic   Recumbent Bike  Lvl 2, 6 min       Knee/Hip Exercises: Standing   Functional Squat  10 reps;3 seconds    Functional Squat Limitations  Min cueing for proper depth  Reviewed upon pt. request       Knee/Hip Exercises: Supine   Straight Leg Raises  Left;10 reps    Straight Leg Raises Limitations  1#     Other  Supine Knee/Hip Exercises  B HS curl with heels on peanut p-ball x 15 reps       Knee/Hip Exercises: Sidelying   Hip ABduction  Left;10 reps    Hip ABduction Limitations  1#     Hip ADduction  Left;10 reps    Hip ADduction Limitations  1#     Clams  L clam shell with red TB at knees x 10 reps       Knee/Hip Exercises: Prone   Hamstring Curl  10 reps;3 seconds    Hamstring Curl Limitations  pain free    Hip Extension  Left;10 reps    Hip Extension Limitations  pain free       Modalities   Modalities  Iontophoresis      Iontophoresis   Type of Iontophoresis  Dexamethasone    Location  medial distal HS     Dose  1.220ml, 6980mA-min     Time  4-6 hr wear time       Vasopneumatic  Number Minutes Vasopneumatic   10 minutes    Vasopnuematic Location   Knee L     Vasopneumatic Pressure  Medium    Vasopneumatic Temperature   coldest temp.      Manual Therapy   Manual Therapy  Soft tissue mobilization;Passive ROM    Manual therapy comments  supine     Soft tissue mobilization  STM to L lateral/medial HS; decreased tenderness today as compared to last visit     Passive ROM  Manual L HS (med/lateral) stretch with therapist x 30 sec each              PT Education - 04/27/17 1029    Education provided  Yes    Education Details  4-way SLR laying down, sidelying clam shell with red TB issued to pt., Iontophoresis educational handout issued to pt. including contraindications/precautions and proper wear time with pt. verbalizing understanding of this     Person(s) Educated  Patient    Methods  Explanation;Demonstration;Verbal cues;Handout    Comprehension  Verbalized understanding;Returned demonstration;Verbal cues required;Need further instruction          PT Long Term Goals - 04/23/17 1025      PT LONG TERM GOAL #1   Title  Independent with ongoing HEP +/- gym program    Status  On-going      PT LONG TERM GOAL #2   Title  Pt will demontrate improved LE flexibility to allow  for L knee extension AROM in open chain to 0 dg    Status  On-going      PT LONG TERM GOAL #3   Title  B LE hip and knee strength >/= 4/5 for improved function    Status  On-going      PT LONG TERM GOAL #4   Title  Pt will report ability to participate in desired exercise and recreational activities w/o limitation due to L hamstring pain    Status  On-going            Plan - 04/27/17 1040    Clinical Impression Statement  Marshal reporting she was able to attend Zomba class and ran seven miles yesterday without issue.  Has been performing stretches daily.  Tolerated addition of gentle hip strengthening activities well today.  HEP updated with pt. verbalizing plans to purchase ankle weights for home use.  Pt. reporting tenderness in L HS has improved some since last visit however some remaining tenderness today.  Initiated iontophoresis as MD signed ionto order.  Pt. issued educational handout including iontophoresis contraindications/precautions, and proper wear time with pt. verbalizing understanding of this.  Applied ionto patch #1/6 to L distal HS for hopeful improvement in tenderness.  Pt. reporting she is only feeling short-lasting L HS pain occasionally at this point and seems to be responding well to all activities performed in therapy.  Will continue to progress toward goals in coming visits.      PT Treatment/Interventions  Patient/family education;ADLs/Self Care Home Management;Therapeutic exercise;Therapeutic activities;Neuromuscular re-education;Balance training;Manual techniques;Dry needling;Taping;Passive range of motion;Electrical Stimulation;Cryotherapy;Vasopneumatic Device;Moist Heat;Iontophoresis 4mg /ml Dexamethasone;Ultrasound    Consulted and Agree with Plan of Care  Patient       Patient will benefit from skilled therapeutic intervention in order to improve the following deficits and impairments:  Pain, Impaired flexibility, Decreased range of motion, Decreased strength,  Increased muscle spasms, Decreased activity tolerance, Difficulty walking  Visit Diagnosis: Acute pain of left knee  Other symptoms and signs involving the musculoskeletal system  Muscle  weakness (generalized)     Problem List Patient Active Problem List   Diagnosis Date Noted  . Hamstring injury, left, initial encounter 02/02/2017  . Hamstring injury, right, initial encounter 08/12/2016  . Viral upper respiratory infection 03/27/2016  . Trigger point of left shoulder region 07/02/2015  . Abnormality of gait 02/13/2015  . Lumbar radiculopathy, acute 12/04/2014  . 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 Fibromuscular dysplasia (HCC) 08/29/2014  . Acute headache 08/23/2014  . Trigger point of right shoulder region 08/23/2014  . Neck pain 08/14/2014  . Nonallopathic lesion of sacral region 05/01/2014  . Hip flexor tightness 09/06/2013  . Nonallopathic lesion of thoracic region 12/13/2012  . Lumbar back pain 05/17/2012  . Leg length discrepancy 05/17/2012  . Nonallopathic lesion of lumbosacral region 05/17/2012    Kermit Balo, PTA 04/27/17 10:48 AM   [facility] [address]  [address] [place], [place], [address] Phone: [phone]   Fax:  [phone]  Name: Eyla Tallon MRN: [id number] Date of Birth: 17-May-1965

## 2017-04-27 NOTE — Patient Instructions (Addendum)

## 2017-04-30 ENCOUNTER — Ambulatory Visit: Payer: Managed Care, Other (non HMO) | Admitting: Physical Therapy

## 2017-04-30 DIAGNOSIS — M25562 Pain in left knee: Secondary | ICD-10-CM

## 2017-04-30 DIAGNOSIS — M6281 Muscle weakness (generalized): Secondary | ICD-10-CM

## 2017-04-30 DIAGNOSIS — R29898 Other symptoms and signs involving the musculoskeletal system: Secondary | ICD-10-CM

## 2017-04-30 NOTE — Therapy (Signed)
[facility] [address]  [address] [place], [place], [address] Phone: [phone]   Fax:  [phone]  Physical Therapy Treatment  Patient Details  Name: Rachel Golden MRN: [id number] Date of Birth: 04/11/1965 Referring Provider: Antoine PrimasZachary Smith, DO   Encounter Date: 04/30/2017  PT End of Session - 04/30/17 1053    Visit Number  4    Number of Visits  8    Date for PT Re-Evaluation  05/14/17    Authorization Type  [employer] - VL: [place]    PT Start Time  1053    PT Stop Time  1152    PT Time Calculation (min)  59 min    Activity Tolerance  Patient tolerated treatment well    Behavior During Therapy  [facility] for tasks assessed/performed       Past Medical History:  Diagnosis Date  . Chronic back pain   . Stroke [facility](HCC)     Past Surgical History:  Procedure Laterality Date  . NASAL SEPTUM SURGERY      There were no vitals filed for this visit.  Subjective Assessment - 04/30/17 1056    Subjective  Pt only noting pain currently with deep squat but otherwise not having much of a problem with pain. Has been conservatively increasing her activity including running and Zumba with no adverse reactions.    Patient Stated Goals  "to be able to stay active and improve flexibility"    Currently in Pain?  No/denies    Pain Score  0-No pain                      [facility] Adult PT Treatment/Exercise - 04/30/17 1053      Exercises   Exercises  Knee/Hip      Knee/Hip Exercises: Aerobic   Nustep  L 4 x 6' (LE only)      Knee/Hip Exercises: Machines for Strengthening   Cybex Knee Flexion  B LE 20# x10; B con/L ecc 15# x10      Knee/Hip Exercises: Standing   Hip Flexion  Both;10 reps;Knee straight;Stengthening    Hip Flexion Limitations  reb TB    Hip ADduction  Both;10 reps;Strengthening    Hip ADduction Limitations  red TB    Hip Abduction  Both;10 reps;Knee straight;Stengthening    Abduction Limitations  red TB    Hip  Extension  Both;10 reps;Knee straight;Stengthening    Extension Limitations  red TB    Other Standing Knee Exercises  L RDL to BATCA seat at lowest height x10    Other Standing Knee Exercises  B sidestepping & fwd/back monster walk with red TB 2 x 2930ft      Knee/Hip Exercises: Supine   Bridges  Both;10 reps;Strengthening;2 sets    Bridges Limitations  on heels on mat & on 6" FR    Single Leg Bridge  Left;Right;10 reps opp knee to chest      Knee/Hip Exercises: Sidelying   Hip ADduction  Left;10 reps      Modalities   Modalities  Vasopneumatic      Vasopneumatic   Number Minutes Vasopneumatic   10 minutes    Vasopnuematic Location   Knee L     Vasopneumatic Pressure  Medium    Vasopneumatic Temperature   coldest temp.             PT Education - 04/30/17 1148    Education provided  Yes    Education  Details  HEP update    Person(s) Educated  Patient    Methods  Explanation;Demonstration;Handout    Comprehension  Verbalized understanding;Returned demonstration          PT Long Term Goals - 04/23/17 1025      PT LONG TERM GOAL #1   Title  Independent with ongoing HEP +/- gym program    Status  On-going      PT LONG TERM GOAL #2   Title  Pt will demontrate improved LE flexibility to allow for L knee extension AROM in open chain to 0 dg    Status  On-going      PT LONG TERM GOAL #3   Title  B LE hip and knee strength >/= 4/5 for improved function    Status  On-going      PT LONG TERM GOAL #4   Title  Pt will report ability to participate in desired exercise and recreational activities w/o limitation due to L hamstring pain    Status  On-going            Plan - 04/30/17 1059    Clinical Impression Statement  Pt reporting pain has been well controlled noting discomfort only with deep squats. Pt noting some difficulty with 4 way SLR, esp sidelying hip adduction, therefore introduced standing alternative with red TB with pt reporting preference for this. Good  tolerance for exercise progression including hamstring focused strengthening.    Rehab Potential  Good    PT Treatment/Interventions  Patient/family education;ADLs/Self Care Home Management;Therapeutic exercise;Therapeutic activities;Neuromuscular re-education;Balance training;Manual techniques;Dry needling;Taping;Passive range of motion;Electrical Stimulation;Cryotherapy;Vasopneumatic Device;Moist Heat;Iontophoresis 4mg /ml Dexamethasone;Ultrasound    Consulted and Agree with Plan of Care  Patient       Patient will benefit from skilled therapeutic intervention in order to improve the following deficits and impairments:  Pain, Impaired flexibility, Decreased range of motion, Decreased strength, Increased muscle spasms, Decreased activity tolerance, Difficulty walking  Visit Diagnosis: Acute pain of left knee  Other symptoms and signs involving the musculoskeletal system  Muscle weakness (generalized)     Problem List Patient Active Problem List   Diagnosis Date Noted  . Hamstring injury, left, initial encounter 02/02/2017  . Hamstring injury, right, initial encounter 08/12/2016  . Viral upper respiratory infection 03/27/2016  . Trigger point of left shoulder region 07/02/2015  . Abnormality of gait 02/13/2015  . Lumbar radiculopathy, acute 12/04/2014  . 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 Fibromuscular dysplasia (HCC) 08/29/2014  . Acute headache 08/23/2014  . Trigger point of right shoulder region 08/23/2014  . Neck pain 08/14/2014  . Nonallopathic lesion of sacral region 05/01/2014  . Hip flexor tightness 09/06/2013  . Nonallopathic lesion of thoracic region 12/13/2012  . Lumbar back pain 05/17/2012  . Leg length discrepancy 05/17/2012  . Nonallopathic lesion of lumbosacral region 05/17/2012    Marry Guan, PT, MPT 04/30/2017, 11:59 AM  [facility] [address]  [address] [place], [place],  [address] Phone: [phone]   Fax:  [phone]  Name: Rachel Golden MRN: [id number] Date of Birth: [birth year]

## 2017-05-05 ENCOUNTER — Ambulatory Visit: Payer: Managed Care, Other (non HMO)

## 2017-05-05 DIAGNOSIS — R29898 Other symptoms and signs involving the musculoskeletal system: Secondary | ICD-10-CM

## 2017-05-05 DIAGNOSIS — M25562 Pain in left knee: Secondary | ICD-10-CM | POA: Diagnosis not present

## 2017-05-05 DIAGNOSIS — M6281 Muscle weakness (generalized): Secondary | ICD-10-CM

## 2017-05-05 NOTE — Therapy (Signed)
[facility] [place] [facility] [address]  [address] [place], [place], [address] Phone: [phone]   Fax:  [phone]  Physical Therapy Treatment  Patient Details  Name: Rachel Golden MRN: [id number] Date of Birth: 11/21/1965 Referring Provider: Antoine PrimasZachary Smith, DO   Encounter Date: 05/05/2017  PT End of Session - 05/05/17 1104    Visit Number  5    Number of Visits  8    Date for PT Re-Evaluation  05/14/17    Authorization Type  [employer]ign[employer] - VL: [place]    PT Start Time  1101    PT Stop Time  1140    PT Time Calculation (min)  39 min    Activity Tolerance  Patient tolerated treatment well    Behavior During Therapy  [facility] for tasks assessed/performed       Past Medical History:  Diagnosis Date  . Chronic back pain   . Stroke [facility](HCC)     Past Surgical History:  Procedure Laterality Date  . NASAL SEPTUM SURGERY      There were no vitals filed for this visit.  Subjective Assessment - 05/05/17 1103    Subjective  Pt. reporting able to perform eight mile jog a few days ago without issue.  Did not have pain after last visit.  Noticed improved ability to "deep squat" without pain.  Able to perform Zumba Monday night without pain.      Patient Stated Goals  "to be able to stay active and improve flexibility"    Currently in Pain?  No/denies    Pain Score  0-No pain    Multiple Pain Sites  No         [facility] PT Assessment - 05/05/17 1141      Assessment   Next MD Visit  2.12.19                  [facility] Treatment/Exercise - 05/05/17 1108      Knee/Hip Exercises: Stretches   Gastroc Stretch  Left;1 rep;30 seconds    Gastroc Stretch Limitations  leaning into counter       Knee/Hip Exercises: Aerobic   Nustep  L 6 x 6' (LE only)      Knee/Hip Exercises: Machines for Strengthening   Cybex Leg Press  B LE's 20# x 15 reps; L LE only 15# x 10 reps       Knee/Hip Exercises: Standing   Forward Lunges  Right;Left;10 reps;1 second     Forward Lunges Limitations  reverse lunges at counter     Forward Step Up  Left;Step Height: 6";10 reps    Forward Step Up Limitations  with focus on slow con/ecc and red TB TKE at L knee    Step Down  Left;10 reps;Step Height: 4";Hand Hold: 1    Step Down Limitations  intermittent UE support on counter     Wall Squat  3 seconds;10 reps    Wall Squat Limitations  leaning on orange p-ball on wall     Other Standing Knee Exercises  L RDL to BATCA seat at lowest height x10    Other Standing Knee Exercises  B sidestepping & fwd/back monster walk with red TB 2 x 2640ft      Knee/Hip Exercises: Supine   Bridges  Both;15 reps;Strengthening    Bridges Limitations  on heels on mat & on 6" FR      Manual Therapy   Passive ROM  Manual L HS (med/lateral)  stretch with therapist x 30 sec each                   PT Long Term Goals - 04/23/17 1025      PT LONG TERM GOAL #1   Title  Independent with ongoing HEP +/- gym program    Status  On-going      PT LONG TERM GOAL #2   Title  Pt will demontrate improved LE flexibility to allow for L knee extension AROM in open chain to 0 dg    Status  On-going      PT LONG TERM GOAL #3   Title  B LE hip and knee strength >/= 4/5 for improved function    Status  On-going      PT LONG TERM GOAL #4   Title  Pt will report ability to participate in desired exercise and recreational activities w/o limitation due to L hamstring pain    Status  On-going            Plan - 05/05/17 1104    Clinical Impression Statement  Rachel Golden doing well today.  Was not sore following last visit and reports performing updated HEP daily.  Feels her ability to deep squat has improved with less pain.  Was able to jog eight miles yesterday and perform Zumba class without pain.  Tolerated mild advancement in LE strengthening activities well today.  Progressing well toward goals.      PT Treatment/Interventions  Patient/family education;ADLs/Self Care Home  Management;Therapeutic exercise;Therapeutic activities;Neuromuscular re-education;Balance training;Manual techniques;Dry needling;Taping;Passive range of motion;Electrical Stimulation;Cryotherapy;Vasopneumatic Device;Moist Heat;Iontophoresis 4mg /ml Dexamethasone;Ultrasound    Consulted and Agree with Plan of Care  Patient       Patient will benefit from skilled therapeutic intervention in order to improve the following deficits and impairments:  Pain, Impaired flexibility, Decreased range of motion, Decreased strength, Increased muscle spasms, Decreased activity tolerance, Difficulty walking  Visit Diagnosis: Acute pain of left knee  Other symptoms and signs involving the musculoskeletal system  Muscle weakness (generalized)     Problem List Patient Active Problem List   Diagnosis Date Noted  . Hamstring injury, left, initial encounter 02/02/2017  . Hamstring injury, right, initial encounter 08/12/2016  . Viral upper respiratory infection 03/27/2016  . Trigger point of left shoulder region 07/02/2015  . Abnormality of gait 02/13/2015  . Lumbar radiculopathy, acute 12/04/2014  . 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 Fibromuscular dysplasia (HCC) 08/29/2014  . Acute headache 08/23/2014  . Trigger point of right shoulder region 08/23/2014  . Neck pain 08/14/2014  . Nonallopathic lesion of sacral region 05/01/2014  . Hip flexor tightness 09/06/2013  . Nonallopathic lesion of thoracic region 12/13/2012  . Lumbar back pain 05/17/2012  . Leg length discrepancy 05/17/2012  . Nonallopathic lesion of lumbosacral region 05/17/2012    Kermit Balo, PTA 05/05/17 11:51 AM   [facility] [address]  [address] [place], [place], [address] Phone: [phone]   Fax:  [phone]  Name: Rachel Golden MRN: [id number] Date of Birth: 1965/08/25

## 2017-05-07 ENCOUNTER — Ambulatory Visit: Payer: Managed Care, Other (non HMO) | Admitting: Physical Therapy

## 2017-05-12 ENCOUNTER — Ambulatory Visit: Payer: Managed Care, Other (non HMO) | Attending: Family Medicine

## 2017-05-12 DIAGNOSIS — M6281 Muscle weakness (generalized): Secondary | ICD-10-CM | POA: Diagnosis present

## 2017-05-12 DIAGNOSIS — M25562 Pain in left knee: Secondary | ICD-10-CM | POA: Diagnosis present

## 2017-05-12 DIAGNOSIS — R29898 Other symptoms and signs involving the musculoskeletal system: Secondary | ICD-10-CM

## 2017-05-12 NOTE — Therapy (Signed)
[facility] [place] [address]  [place], [place], [address] Phone: [phone]   Fax:  [phone]  Physical Therapy Treatment  Patient Details  Name: Rachel Golden MRN: [id number] Date of Birth: 03-Apr-1966 Referring Provider: Hulan Saas, DO   Encounter Date: 05/12/2017  PT End of Session - 05/12/17 1112    Visit Number  6    Number of Visits  8    Date for PT Re-Evaluation  05/14/17    Authorization Type  [employer] - VL: [place]    PT Start Time  1100    PT Stop Time  1202    PT Time Calculation (min)  62 min    Activity Tolerance  Patient tolerated treatment well    Behavior During Therapy  [facility] for tasks assessed/performed       Past Medical History:  Diagnosis Date  . Chronic back pain   . Stroke [facility])     Past Surgical History:  Procedure Laterality Date  . NASAL SEPTUM SURGERY      There were no vitals filed for this visit.  Subjective Assessment - 05/12/17 1109    Subjective  Pt. doing well today.  Pt. performing HIIT in mornings Tuesday and Thursdays.      Patient Stated Goals  "to be able to stay active and improve flexibility"    Currently in Pain?  No/denies    Pain Score  0-No pain    Multiple Pain Sites  No         [facility] PT Assessment - 05/12/17 1116      AROM   Left Knee Extension  0 seated LAQ      Strength   Strength Assessment Site  Knee;Hip    Right/Left Hip  Right;Left    Right Hip Flexion  4+/5    Right Hip Extension  4-/5    Right Hip External Rotation   4+/5    Right Hip Internal Rotation  4+/5    Right Hip ABduction  4+/5    Right Hip ADduction  4/5    Left Hip Flexion  4+/5    Left Hip Extension  4-/5    Left Hip External Rotation  4+/5    Left Hip Internal Rotation  4+/5    Left Hip ABduction  4+/5    Left Hip ADduction  4-/5    Right/Left Knee  Right;Left    Right Knee Flexion  5/5    Right Knee Extension  5/5    Left Knee Flexion  4+/5    Left Knee Extension  5/5                   [facility] Adult PT Treatment/Exercise - 05/12/17 1132      Knee/Hip Exercises: Aerobic   Nustep  L 6 x 6' (LE only)      Knee/Hip Exercises: Machines for Strengthening   Cybex Knee Flexion  B LE 25# x 10; B con/L ecc 15# x 15      Knee/Hip Exercises: Standing   Functional Squat  3 seconds;15 reps    Functional Squat Limitations  TRX; deep     Other Standing Knee Exercises  L RDL to cone 2 x 5 reps     Other Standing Knee Exercises  B sidestepping & fwd/back monster walk with green TB 2 x 6f      Knee/Hip Exercises: Supine   Single Leg Bridge  Left;Right;10 reps  Vasopneumatic   Number Minutes Vasopneumatic   15 minutes    Vasopnuematic Location   Knee L    Vasopneumatic Pressure  Medium    Vasopneumatic Temperature   coldest temp.      Manual Therapy   Passive ROM  Manual L HS (med/lateral) stretch with therapist x 30 sec each                   PT Long Term Goals - 05/12/17 1112      PT LONG TERM GOAL #1   Title  Independent with ongoing HEP +/- gym program    Status  Partially Met met for current       PT LONG TERM GOAL #2   Title  Pt will demontrate improved LE flexibility to allow for L knee extension AROM in open chain to 0 dg    Status  Achieved      PT LONG TERM GOAL #3   Title  B LE hip and knee strength >/= 4/5 for improved function    Status  Partially Met met with exception of B hip extensors, L hip adductors 4-/5      PT LONG TERM GOAL #4   Title  Pt will report ability to participate in desired exercise and recreational activities w/o limitation due to L hamstring pain    Status  Partially Met Pt. reporting speed with jogging still somewhat limited due to L hamstring discomfort; no longer limited with other activities            Plan - 05/12/17 1112    Clinical Impression Statement  Rachel Golden able to perform HIIT class this morning and perform regular jogging routine without pain over weekend.  Does still feel  limited by hamstring "discomfort" with faster jog speeds.  Able to demo good improvement in LE strength with MMT today.  Demonstrating improved LE flexibility today with LAQ to full knee extension.  Pt. with  f/u to MD scheduled for 2.13.19 and long-distance race scheduled before this.  Will monitor pt. status following race and continue to progress toward goals.      PT Treatment/Interventions  Patient/family education;ADLs/Self Care Home Management;Therapeutic exercise;Therapeutic activities;Neuromuscular re-education;Balance training;Manual techniques;Dry needling;Taping;Passive range of motion;Electrical Stimulation;Cryotherapy;Vasopneumatic Device;Moist Heat;Iontophoresis 50m/ml Dexamethasone;Ultrasound    Consulted and Agree with Plan of Care  Patient       Patient will benefit from skilled therapeutic intervention in order to improve the following deficits and impairments:  Pain, Impaired flexibility, Decreased range of motion, Decreased strength, Increased muscle spasms, Decreased activity tolerance, Difficulty walking  Visit Diagnosis: Acute pain of left knee  Other symptoms and signs involving the musculoskeletal system  Muscle weakness (generalized)     Problem List Patient Active Problem List   Diagnosis Date Noted  . Hamstring injury, left, initial encounter 02/02/2017  . Hamstring injury, right, initial encounter 08/12/2016  . Viral upper respiratory infection 03/27/2016  . Trigger point of left shoulder region 07/02/2015  . Abnormality of gait 02/13/2015  . Lumbar radiculopathy, acute 12/04/2014  . Arterial ischemic stroke ([place] 08/29/2014  . [place]romuscular dysplasia ([place] 08/29/2014  . Acute headache 08/23/2014  . Trigger point of right shoulder region 08/23/2014  . Neck pain 08/14/2014  . Nonallopathic lesion of sacral region 05/01/2014  . Hip flexor tightness 09/06/2013  . Nonallopathic lesion of thoracic region 12/13/2012  . Lumbar back pain 05/17/2012  . Leg  length discrepancy 05/17/2012  . Nonallopathic lesion of lumbosacral region 05/17/2012   MBess Harvest PTA  05/12/17 12:00 PM   [facility] [address]  [place], [place], [address] Phone: [phone]   Fax:  [phone]  Name: Rachel Golden MRN: [id number] Date of Birth: Feb 10, 1966

## 2017-05-14 ENCOUNTER — Ambulatory Visit: Payer: Managed Care, Other (non HMO) | Admitting: Physical Therapy

## 2017-05-14 ENCOUNTER — Encounter: Payer: Self-pay | Admitting: Neurology

## 2017-05-14 ENCOUNTER — Ambulatory Visit: Payer: Managed Care, Other (non HMO) | Admitting: Neurology

## 2017-05-14 VITALS — BP 100/70 | HR 65 | Ht 60.0 in | Wt 121.1 lb

## 2017-05-14 DIAGNOSIS — I773 Arterial fibromuscular dysplasia: Secondary | ICD-10-CM | POA: Diagnosis not present

## 2017-05-14 NOTE — Progress Notes (Signed)
Follow-up Visit   Date: 05/14/17    Rachel Golden MRN: [id number] DOB: 1965-10-25   Interim History: Rachel Golden is a 52 y.o. right-handed Caucasian female with chronic back pain, fibromuscular dysplasia and stroke returning to the clinic with new complaints of right neck pain.  The patient was accompanied to the clinic by self.  History of present illness: On May 13th 2016, she was picking up a hanger off the floor and developed acute onset of dizziness.  She initially thought it was because she got up too quickly.  She then heard a "swooshing" sound in her head and developed a piercing headache. Swooshing sound resolved within 5 minutes.  Within 30-minutes, she developed right hand tingling and numbness of the lips.  She called 911 who evaluated her and did not feel that her symptoms were consistent with a stroke, so was not taken to the emergency department.  She also recalls having neck pain especially with neck rotation to the right. On May 19th, she was getting out of the car and developed neck stiffness and heard a swooshing sound in her right ear with associated brief but severe headache.  She also has associated vision disturbance described as "kelidoscope" vision, dizzy, and nauseous. She had difficulty walking out of her car to her home, but was able to make it into her home slowly.  Symptoms lasted about 10-minutes.   She again went to see Dr. [name] on the same day who ordered MRI/A of her head.  Imaging shows small infarcts involving the right cerebellum and medial right parieto-occipital region. Vessel imaging was shows vascular irregularity involving bilateral vertebral arteries with ?right dissection, concerning for fibromuscular dysplasia but I do not appreciate this well on my review.  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  She was started on aspirin '325mg'$  and lipitor '20mg'$  daily.  No personal or family history of stroke.  No personal history of hypertension.  Of note, she  has received cervical manipulation for stiffness this month, which alleviated her neck discomfort.  UPDATE 10/04/2014:  She came back from a [facility] trip with her family in [place] and [place] and reports having a sensation of dizziness lasting for about 5 minutes while there.  There was no associated headache or whooshing sound.  The spells resolved and she has no had anything similar again. Her stroke work-up has been returning largely normal including echocardiogram, holter monitor, and vasculitis/hypercoagulable labs. She was very happy with her evaluation with Dr. Kathyrn Sheriff who suggested that only if symptoms worsen or she develops new headache, would cerebral angiogram be indicated.    UPDATE 04/29/2015:  Starting in December, she began experiencing high-pitched sound in the right ear, neck burning, and imbalance.  Neck discomfort is constant and involves the right side, which is worse when at rest and improved with activity. Heat and cold compresses alleviates her discomfort.  She has not tried NSAIDs or tylenol.  She has noticed the high-pitched sound and imbalance about 4-5 times.    She endorses a significant amount of stress related to her father passing in August and having to manage her mother.   UPDATE 02/26/2016:  Starting around the summer of 2017, she began having achy pain over the base of the head on the right.  It is tender to touch, but she denies any electrical or shooting pain into her scalp.  She has not tried NSAIDs or flexeril for her pain.   UPDATE 09/01/2016:  She scheduled an  appointment today for ongoing right sided neck pain.  She complains of soreness at the base of her neck on the right which responds to Aleve, but does not take this often.  Pain is ranked as 3/10 and occurs about 5 times per week. This is exactly the same as the pain she was evaluated at her last visit.  She denies any tingling, burning, or sharp radiating pain.  She has a colonoscopy coming up and is  very nervous that her pain would cause any issue with her upcoming procedure.   UPDATE 05/14/2017:  She is here for follow-up visit.  She does not have any new complaints, such as dizziness or headaches. Her neck pain has subsided with stretching exercises.  She has not had any interval hospitalizations, falls, or illnesses.    Medications:  Current Outpatient Medications on File Prior to Visit  Medication Sig Dispense Refill  . aspirin EC 81 MG tablet Take 1 tablet (81 mg total) by mouth daily.    . Cholecalciferol (VITAMIN D3) 1000 units CAPS Take 2,000 Units by mouth.    . cyclobenzaprine (FLEXERIL) 5 MG tablet Take 1 tablet (5 mg total) by mouth at bedtime as needed for muscle spasms (neck pain). 30 tablet 3  . ZORVOLEX 18 MG CAPS as needed.   1   No current facility-administered medications on file prior to visit.     Allergies: No Known Allergies  Review of Systems:  CONSTITUTIONAL: No fevers, chills, night sweats, or weight loss.  EYES: No visual changes or eye pain ENT: No hearing changes.  No history of nose bleeds.   RESPIRATORY: No cough, wheezing and shortness of breath.   CARDIOVASCULAR: Negative for chest pain, and palpitations.   GI: Negative for abdominal discomfort, blood in stools or black stools.  No recent change in bowel habits.   GU:  No history of incontinence.   MUSCLOSKELETAL: No history of joint pain or swelling.  No myalgias.   SKIN: Negative for lesions, rash, and itching.   ENDOCRINE: Negative for cold or heat intolerance, polydipsia or goiter.   PSYCH:  No depression or anxiety symptoms.   NEURO: As Above.   Vital Signs:  BP 100/70   Pulse 65   Ht 5' (1.524 m)   Wt 121 lb 2 oz (54.9 kg)   LMP 06/03/2014 (Approximate)   SpO2 99%   BMI 23.66 kg/m   General:  Well appearing, comfortable CV:  Regular rate and rhythm, no murmurs Pulm:  Clear to ausculatation Ext: No edema   Neurological Exam: MENTAL STATUS including orientation to time, place,  person, recent and remote memory, attention span and concentration, language, and fund of knowledge is normal.  Speech is not dysarthric.  CRANIAL NERVES: Normal fundoscopic exam bilaterally. No visual field defects. Pupils equal round and reactive to light. No Horner's syndrome.  Normal conjugate, extra-ocular eye movements in all directions of gaze.  No ptosis. Normal facial sensation.  Face is symmetric. Palate elevates symmetrically.  Tongue is midline.  MOTOR:  Motor strength is 5/5 in all extremities.  Tone is normal.  No pronator drift.  MSRs:  Reflexes are brisk and symmetric 3+/4 throughout.  SENSORY:  Intact to vibration.  COORDINATION/GAIT:  Normal finger-to- nose-finger.  Intact rapid alternating movements bilaterally.  Gait narrow based and stable. Tandem gait intact.  Data: Labs 08/23/2014:  CRP <0.1, ESR 12, ANA neg, HbA1c 5.5, LDL 55, HDL 76, C3/C4 normal, ENA neg, ANA neg, RF neg, hypercoagulable panel negative (normal  protein C and S activity, no Factor V leiden mutation, normal antithrombin III and anticardiolipin antibody)  Surface echocardiogram 08/31/2014:  normal, EF 60% no thrombus  08/30/2014  48-hr holter:   No significant arrhythmias.  No atrial fib or etiology for an embolic neuro event.  Rare atrial and ventricular ectopy noted.  Predominant rhythm is sinus.     MRI brain 08/27/2014:  Subacute nonhemorrhagic small infarcts involving mid to inferior right cerebellum and medial aspect of the right parietal -occipital lobe.  No intracranial hemorrhage.  MRA head 08/27/2014:  Ectatic cavernous segment internal carotid artery bilaterally. Mild narrowing petrous segment of the internal carotid artery bilaterally. Middle cerebral mild to moderate branch vessel narrowing and irregularity bilaterally greater on the right.   1.6 x 1.6 mm bulge M1 segment right middle cerebral artery appears to be origin of a vessel rather than saccular aneurysm. Stability can be confirmed on  follow-up.   Moderate narrowing at the junction of the A1 segment and A2 segment of the right anterior cerebral artery.  Ectatic vertebral arteries and basilar artery without high-grade stenosis.  Full extent of the left posterior inferior cerebellar artery not imaged on present exam. This is noted on the contrast-enhanced MR angiogram of the neck and appears ectatic were proximal narrowing.  Nonvisualized left anterior inferior cerebellar artery.  Narrowing and irregularity superior cerebellar artery greater on the left.  Posterior cerebral artery branch vessel narrowing and irregularity greater on the left.   MRA NECK FINDINGS Ectatic pleated dilated appearance of the vertebral arteries bilaterally suggestive of fibromuscular dysplasia. With the patient's subacute right sided infarcts, subtle focal dissection with subsequent embolic thrombus cannot be entirely excluded although not detected on the current exam   No significant stenosis of either carotid bifurcation. Ectatic internal carotid artery cervical segment bilaterally may also reflect changes of fibromuscular dysplasia but without pleated appearance as noted involving the vertebral arteries.   MRA head 05/03/2015: 1. 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 No associated stenosis. The bilateral V4 segments appear spared. 2. Bilateral ICA dolichoectasia, without the cervical ICA beaded appearance typical of carotid FMD. No carotid stenosis. 3.  Negative intracranial MRA aside from ICA siphon tortuosity.  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  Given no new neurological symptoms, absence of aneurysm on MRA, and normal exam  findings, continue to follow clinically.  Blood pressure is excellent.  Plan to assess vessel status with MRA head and neck in 1 year.   She will be continued on aspirin 30m daily.  Advised against high velocity neck/skeletal manipulation.  Return to clinic in 1 year  Greater than 50% of this 15 minute visit was spent in counseling, explanation of diagnosis, planning of further management, and coordination of care.    Thank you for allowing me to participate in patient's care.  If I can answer any additional questions, I would be pleased to do so.    Sincerely,    Shari Natt K. PPosey Pronto DO

## 2017-05-14 NOTE — Patient Instructions (Signed)
Return to clinic 1 year  

## 2017-05-18 NOTE — Progress Notes (Signed)
Rachel Golden D.O. [facility] [address] [place], [place] [address] Phone: [phone] Subjective:    I'm seeing this patient by the request  of:    CC: Back pain follow-up  BMW:[id number]:Subjective  Rachel Ormondndrea Golden is a 52 y.o. female coming in with complaint of back pain.  Past medical history significant for fibromuscular dysplasia.  Patient has responded fairly well to osteopathic manipulation.Her back felt good during her run and during the day.  Patient ran on Sunday and states that her knee felt great. She has been doing physical therapy for her knee. She notes that her knee feels differently but she is not having any pain.     Patient's MRI from 2016 did show left L4 nerve root impingement.  Then 2 epidurals last one in 2016.  Past Medical History:  Diagnosis Date  . Chronic back pain   . Stroke [facility](HCC)    Past Surgical History:  Procedure Laterality Date  . NASAL SEPTUM SURGERY     Social History   Socioeconomic History  . Marital status: Married    Spouse name: None  . Number of children: None  . Years of education: None  . Highest education level: None  Social Needs  . Financial resource strain: None  . Food insecurity - worry: None  . Food insecurity - inability: None  . Transportation needs - medical: None  . Transportation needs - non-medical: None  Occupational History  . None  Tobacco Use  . Smoking status: Never Smoker  . Smokeless tobacco: Never Used  Substance and Sexual Activity  . Alcohol use: Yes    Alcohol/week: 0.0 oz    Comment: 3-4 drinks per week, on occasion  . Drug use: No  . Sexual activity: None  Other Topics Concern  . None  Social History Narrative   Lives with husband and 2 children in a 2 story home.     Works as an Airline pilotaccountant part time.     Education: BS degree.   No Known Allergies Family History  Problem Relation Age of Onset  . Heart murmur Mother        Living, 5879  . Alzheimer's disease Father        Living, 5380  .  Vascular Disease Brother   . Healthy Son   . Healthy Daughter      Past medical history, social, surgical and family history all reviewed in electronic medical record.  No pertanent information unless stated regarding to the chief complaint.   Review of Systems:Review of systems updated and as accurate as of 05/19/17  No headache, visual changes, nausea, vomiting, diarrhea, constipation, dizziness, abdominal pain, skin rash, fevers, chills, night sweats, weight loss, swollen lymph nodes, body aches, joint swelling, , chest pain, shortness of breath, mood changes.  Positive muscle aches  Objective  Blood pressure 102/72, pulse 88, height 5' (1.524 m), weight 122 lb (55.3 kg), last menstrual period 06/03/2014, SpO2 97 %. Systems examined below as of 05/19/17   General: No apparent distress alert and oriented x3 mood and affect normal, dressed appropriately.  HEENT: Pupils equal, extraocular movements intact  Respiratory: Patient's speak in full sentences and does not appear short of breath  Cardiovascular: No lower extremity edema, non tender, no erythema  Skin: Warm dry intact with no signs of infection or rash on extremities or on axial skeleton.  Abdomen: Soft nontender  Neuro: Cranial nerves II through XII are intact, neurovascularly intact in all extremities with 2+ DTRs  and 2+ pulses.  Lymph: No lymphadenopathy of posterior or anterior cervical chain or axillae bilaterally.  Gait normal with good balance and coordination.  MSK:  Non tender with full range of motion and good stability and symmetric strength and tone of shoulders, elbows, wrist, hip, knee and ankles bilaterally.  Left leg shows still some mild tightness in full extension.  Negative straight leg test.  Mild positive Pearlean Brownie.  Patient is nontender to palpation over the distal hamstring the patient has had previously.  Back exam does not show some discomfort in the paraspinal musculature lumbar spine on the left as well as  left sacroiliac joint.  Limited musculoskeletal ultrasound was performed and interpreted by  [name]  Limited ultrasound shows the patient distal hamstring does have good scar tissue formation with now regeneration tendon.  Seems to be relatively normal at this time. Impression: Healing of distal hamstring tear  Osteopathic findings  T3 extended rotated and side bent right inhaled third rib T6 extended rotated and side bent left L3 flexed rotated and side bent left  Sacrum left on left      Impression and Recommendations:     This case required medical decision making of moderate complexity.      Note: This dictation was prepared with Dragon dictation along with smaller phrase technology. Any transcriptional errors that result from this process are unintentional.

## 2017-05-19 ENCOUNTER — Encounter: Payer: Self-pay | Admitting: Family Medicine

## 2017-05-19 ENCOUNTER — Ambulatory Visit: Payer: Self-pay

## 2017-05-19 ENCOUNTER — Ambulatory Visit: Payer: Managed Care, Other (non HMO) | Admitting: Family Medicine

## 2017-05-19 VITALS — BP 102/72 | HR 88 | Ht 60.0 in | Wt 122.0 lb

## 2017-05-19 DIAGNOSIS — M25561 Pain in right knee: Principal | ICD-10-CM

## 2017-05-19 DIAGNOSIS — M24559 Contracture, unspecified hip: Secondary | ICD-10-CM | POA: Diagnosis not present

## 2017-05-19 DIAGNOSIS — G8929 Other chronic pain: Secondary | ICD-10-CM

## 2017-05-19 DIAGNOSIS — M999 Biomechanical lesion, unspecified: Secondary | ICD-10-CM | POA: Diagnosis not present

## 2017-05-19 DIAGNOSIS — S76302A Unspecified injury of muscle, fascia and tendon of the posterior muscle group at thigh level, left thigh, initial encounter: Secondary | ICD-10-CM

## 2017-05-19 MED ORDER — GABAPENTIN 100 MG PO CAPS: 200.0000 mg | ORAL_CAPSULE | Freq: Every day | ORAL | 3 refills | Status: DC

## 2017-05-19 NOTE — Assessment & Plan Note (Signed)
Decision today to treat with OMT was based on Physical Exam  After verbal consent patient was treated with HVLA, ME, FPR techniques in  thoracic, lumbar and sacral areas no cervical spine  Patient tolerated the procedure well with improvement in symptoms  Patient given exercises, stretches and lifestyle modifications  See medications in patient instructions if given  Patient will follow up in 6-8 weeks

## 2017-05-19 NOTE — Patient Instructions (Addendum)
You are doing great  Proud of you  Gabapentin 200mg  at night in case it is coming from the back  Ice still is good Compression I would still do  See me again in 6-8 weeks

## 2017-05-19 NOTE — Assessment & Plan Note (Signed)
Patient seems to be well healed at this time.  Responding well to osteopathic manipulation.  I do think that there could be some nerve related radicular symptoms.  May need to consider possibly repeating the nerve root injections that she had previously.  Started on low-dose of gabapentin will help with nighttime relief.  Follow-up again 6-8 weeks

## 2017-05-21 ENCOUNTER — Ambulatory Visit: Payer: Managed Care, Other (non HMO)

## 2017-05-21 DIAGNOSIS — M25562 Pain in left knee: Secondary | ICD-10-CM | POA: Diagnosis not present

## 2017-05-21 DIAGNOSIS — R29898 Other symptoms and signs involving the musculoskeletal system: Secondary | ICD-10-CM

## 2017-05-21 DIAGNOSIS — M6281 Muscle weakness (generalized): Secondary | ICD-10-CM

## 2017-05-21 NOTE — Therapy (Addendum)
[facility] [address]  [place], [place], [address] Phone: [phone]   Fax:  [phone]  Physical Therapy Treatment  Patient Details  Name: Rachel Golden MRN: [id number] Date of Birth: Dec 01, 1965 Referring Provider: Hulan Saas, DO   Encounter Date: 05/21/2017  PT End of Session - 05/21/17 0942    Visit Number  7    Number of Visits  8    Date for PT Re-Evaluation  05/14/17    Authorization Type  [employer] - VL: [place]    PT Start Time  0931    PT Stop Time  1014    PT Time Calculation (min)  43 min    Activity Tolerance  Patient tolerated treatment well    Behavior During Therapy  [facility] for tasks assessed/performed       Past Medical History:  Diagnosis Date  . Chronic back pain   . Stroke [facility])     Past Surgical History:  Procedure Laterality Date  . NASAL SEPTUM SURGERY      There were no vitals filed for this visit.      [facility] PT Assessment - 05/21/17 0959      Observation/Other Assessments   Focus on Therapeutic Outcomes (FOTO)   89% (11% limitation)      Strength   Strength Assessment Site  Hip;Knee    Right/Left Hip  Right;Left    Right Hip Flexion  4+/5    Right Hip Extension  4/5    Right Hip External Rotation   4+/5    Right Hip Internal Rotation  4+/5    Right Hip ABduction  4+/5    Right Hip ADduction  4+/5    Left Hip Flexion  4+/5    Left Hip Extension  4/5    Left Hip External Rotation  4+/5    Left Hip Internal Rotation  4+/5    Left Hip ABduction  4+/5    Left Hip ADduction  4+/5    Right/Left Knee  Right;Left    Right Knee Flexion  5/5    Right Knee Extension  5/5    Left Knee Flexion  4+/5    Left Knee Extension  5/5                  [facility] Adult PT Treatment/Exercise - 05/21/17 0943      Self-Care   Self-Care  Other Self-Care Comments    Other Self-Care Comments   Reviewed and updated band resistance with comprehensive HEP with pt. verbalizing understanding       Knee/Hip Exercises: Stretches   Other Knee/Hip Stretches  L sciatic nerve glides in seated and supine positions 5" x 10 reps       Knee/Hip Exercises: Aerobic   Nustep  L 6 x 6' (LE only)      Knee/Hip Exercises: Machines for Strengthening   Cybex Knee Flexion  B con/L ecc 20# x 15 reps       Knee/Hip Exercises: Standing   Hip Flexion  Both;10 reps;Knee straight;Stengthening    Hip Flexion Limitations  green TB    Hip ADduction  Both;10 reps    Hip ADduction Limitations  green TB    Hip Abduction  Both;10 reps;Knee straight    Abduction Limitations  green TB    Hip Extension  Both;10 reps;Knee straight    Extension Limitations  green TB  PT Long Term Goals - 05/21/17 0959      PT LONG TERM GOAL #1   Title  Independent with ongoing HEP +/- gym program    Status  Achieved      PT LONG TERM GOAL #2   Title  Pt will demontrate improved LE flexibility to allow for L knee extension AROM in open chain to 0 dg    Status  Achieved      PT LONG TERM GOAL #3   Title  B LE hip and knee strength >/= 4/5 for improved function    Status  Achieved      PT LONG TERM GOAL #4   Title  Pt will report ability to participate in desired exercise and recreational activities w/o limitation due to L hamstring pain    Status  Achieved            Plan - 05/21/17 0942    Clinical Impression Statement  Rachel Golden reporting recent 10km race went well and MD f/u went well with MD releasing her.  Feels comfortable making today last visit and transitioning to home program.  Supervising PT agreeing to 30-day hold with pt. wishing to go with 30-day hold from therapy.  Reviewed/updated comprehensive HEP with pt. verbalizing understanding.  Pt. able to meet all LTG's of therapy.  Pt. now on 30-day hold.    PT Treatment/Interventions  Patient/family education;ADLs/Self Care Home Management;Therapeutic exercise;Therapeutic activities;Neuromuscular re-education;Balance  training;Manual techniques;Dry needling;Taping;Passive range of motion;Electrical Stimulation;Cryotherapy;Vasopneumatic Device;Moist Heat;Iontophoresis 54m/ml Dexamethasone;Ultrasound    PT Next Visit Plan  30-day hold    Consulted and Agree with Plan of Care  Patient       Patient will benefit from skilled therapeutic intervention in order to improve the following deficits and impairments:  Pain, Impaired flexibility, Decreased range of motion, Decreased strength, Increased muscle spasms, Decreased activity tolerance, Difficulty walking  Visit Diagnosis: Acute pain of left knee  Other symptoms and signs involving the musculoskeletal system  Muscle weakness (generalized)     Problem List Patient Active Problem List   Diagnosis Date Noted  . Hamstring injury, left, initial encounter 02/02/2017  . Hamstring injury, right, initial encounter 08/12/2016  . Viral upper respiratory infection 03/27/2016  . Trigger point of left shoulder region 07/02/2015  . Abnormality of gait 02/13/2015  . Lumbar radiculopathy, acute 12/04/2014  . [place]ial ischemic stroke ([place] 08/29/2014  . Fibromuscular dysplasia ([place] 08/29/2014  . Acute headache 08/23/2014  . Trigger point of right shoulder region 08/23/2014  . Neck pain 08/14/2014  . Nonallopathic lesion of sacral region 05/01/2014  . Hip flexor tightness 09/06/2013  . Nonallopathic lesion of thoracic region 12/13/2012  . Lumbar back pain 05/17/2012  . Leg length discrepancy 05/17/2012  . Nonallopathic lesion of lumbosacral region 05/17/2012    MBess Harvest PTA 05/21/17 10:37 AM  [facility] [place] [address]Phone: [phone]  Fax:  [phone] Name: Rachel TaceyMRN: [id number] of Birth: [birth year] PHYSICAL THERAPY DISCHARGE SUMMARY  Visits from Start of Care: 7  Current functional level related to goals / functional outcomes:   Refer  to above clinical impression for status as of last visit on 05/14/17. Pt was placed on hold for 30 days and has not needed to return to PT, therefore will proceed with discharge from PT for this episode.   Remaining deficits:   As above.   Education / Equipment:   HEP  Plan: Patient agrees to discharge.  Patient goals were partially met. Patient is being discharged due to meeting the stated rehab goals.  ?????    Percival Spanish, PT, MPT 07/22/17, 3:07 PM  [facility] [address]  [place], [place], [address] Phone: [phone]   Fax:  [phone]

## 2017-06-30 ENCOUNTER — Ambulatory Visit: Payer: Managed Care, Other (non HMO) | Admitting: Family Medicine

## 2017-06-30 ENCOUNTER — Encounter: Payer: Self-pay | Admitting: Family Medicine

## 2017-06-30 VITALS — BP 110/82 | HR 82 | Ht 60.0 in | Wt 124.0 lb

## 2017-06-30 DIAGNOSIS — M545 Low back pain, unspecified: Secondary | ICD-10-CM

## 2017-06-30 DIAGNOSIS — M999 Biomechanical lesion, unspecified: Secondary | ICD-10-CM

## 2017-06-30 NOTE — Assessment & Plan Note (Signed)
Decision today to treat with OMT was based on Physical Exam  After verbal consent patient was treated with HVLA, ME, FPR techniques in  thoracic, lumbar and sacral areas  Patient tolerated the procedure well with improvement in symptoms  Patient given exercises, stretches and lifestyle modifications  See medications in patient instructions if given  Patient will follow up in 8 weeks 

## 2017-06-30 NOTE — Patient Instructions (Signed)
Good to see you  I am so impressed  Lets not change a thing See me again in 8-10 weeks

## 2017-06-30 NOTE — Progress Notes (Signed)
Tawana ScaleZach Smith D.O. [facility] [address] [place], [place] [address] Phone: [phone] Subjective:      CC: Back pain follow-up  BJY:NWGNFAOZHYHPI:Subjective  Rachel Ormondndrea Bevens is a 52 y.o. female coming in with complaint of back pain.  Patient has been doing very well.  Has had some tight hamstrings.  Went to physical therapy.  Doing much better and is running.  Also responded well to osteopathic manipulation.  No new findings and if anything seems to be improving overall.    Past Medical History:  Diagnosis Date  . Chronic back pain   . Stroke [facility](HCC)    Past Surgical History:  Procedure Laterality Date  . NASAL SEPTUM SURGERY     Social History   Socioeconomic History  . Marital status: Married    Spouse name: Not on file  . Number of children: Not on file  . Years of education: Not on file  . Highest education level: Not on file  Occupational History  . Not on file  Social Needs  . Financial resource strain: Not on file  . Food insecurity:    Worry: Not on file    Inability: Not on file  . Transportation needs:    Medical: Not on file    Non-medical: Not on file  Tobacco Use  . Smoking status: Never Smoker  . Smokeless tobacco: Never Used  Substance and Sexual Activity  . Alcohol use: Yes    Alcohol/week: 0.0 oz    Comment: 3-4 drinks per week, on occasion  . Drug use: No  . Sexual activity: Not on file  Lifestyle  . Physical activity:    Days per week: Not on file    Minutes per session: Not on file  . Stress: Not on file  Relationships  . Social connections:    Talks on phone: Not on file    Gets together: Not on file    Attends religious service: Not on file    Active member of club or organization: Not on file    Attends meetings of clubs or organizations: Not on file    Relationship status: Not on file  Other Topics Concern  . Not on file  Social History Narrative   Lives with husband and 2 children in a 2 story home.     Works as an Airline pilotaccountant part  time.     Education: BS degree.   No Known Allergies Family History  Problem Relation Age of Onset  . Heart murmur Mother        Living, 8079  . Alzheimer's disease Father        Living, 3080  . Vascular Disease Brother   . Healthy Son   . Healthy Daughter      Past medical history, social, surgical and family history all reviewed in electronic medical record.  No pertanent information unless stated regarding to the chief complaint.   Review of Systems:Review of systems updated and as accurate as of 06/30/17  No headache, visual changes, nausea, vomiting, diarrhea, constipation, dizziness, abdominal pain, skin rash, fevers, chills, night sweats, weight loss, swollen lymph nodes, body aches, joint swelling,  chest pain, shortness of breath, mood changes.  Positive muscle aches  Objective  Blood pressure 110/82, pulse 82, height 5' (1.524 m), weight 124 lb (56.2 kg), last menstrual period 06/03/2014, SpO2 97 %. Systems examined below as of 06/30/17   General: No apparent distress alert and oriented x3 mood and affect normal, dressed appropriately.  HEENT: Pupils equal, extraocular movements intact  Respiratory: Patient's speak in full sentences and does not appear short of breath  Cardiovascular: No lower extremity edema, non tender, no erythema  Skin: Warm dry intact with no signs of infection or rash on extremities or on axial skeleton.  Abdomen: Soft nontender  Neuro: Cranial nerves II through XII are intact, neurovascularly intact in all extremities with 2+ DTRs and 2+ pulses.  Lymph: No lymphadenopathy of posterior or anterior cervical chain or axillae bilaterally.  Gait normal with good balance and coordination.  MSK:  Non tender with full range of motion and good stability and symmetric strength and tone of shoulders, elbows, wrist, hip, knee and ankles bilaterally.  Back Exam:  Inspection: Mild loss of lordosis with some mild degenerative scoliosis Motion: Flexion 45 deg,  Extension 25 deg, Side Bending to 35 deg bilaterally,  Rotation to 35 deg bilaterally  SLR laying: Negative  XSLR laying: Negative  Palpable tenderness: Tender to palpation the paraspinal musculature lumbar spine right greater than left. FABER: Mild positive right. Sensory change: Gross sensation intact to all lumbar and sacral dermatomes.  Reflexes: 2+ at both patellar tendons, 2+ at achilles tendons, Babinski's downgoing.  Strength at foot  Plantar-flexion: 5/5 Dorsi-flexion: 5/5 Eversion: 5/5 Inversion: 5/5  Leg strength  Quad: 5/5 Hamstring: 5/5 Hip flexor: 5/5 Hip abductors: 5/5  Gait unremarkable.  Osteopathic findings  T3 extended rotated and side bent right inhaled third rib T6 extended rotated and side bent left L2 flexed rotated and side bent right Sacrum right on right     Impression and Recommendations:     This case required medical decision making of moderate complexity.      Note: This dictation was prepared with Dragon dictation along with smaller phrase technology. Any transcriptional errors that result from this process are unintentional.

## 2017-06-30 NOTE — Assessment & Plan Note (Signed)
Stable.  Doing really well.  Continue gabapentin at night.  Discussed icing regimen.  Worsening radicular symptoms will need advanced imaging.  Responds well to osteopathic manipulation and will follow-up again in 8 weeks

## 2017-08-31 NOTE — Progress Notes (Deleted)
[name] [address] [place], [place] [address] Phone: [phone] Subjective:    I'm seeing this patient by the request  of:    CC:   [id number]  Rachel Golden is a 52 y.o. female coming in with complaint of ***  Onset-  Location Duration-  Character- Aggravating factors- Reliving factors-  Therapies tried-  Severity-     Past Medical History:  Diagnosis Date  . Chronic back pain   . Stroke [facility])    Past Surgical History:  Procedure Laterality Date  . NASAL SEPTUM SURGERY     Social History   Socioeconomic History  . Marital status: Married    Spouse name: Not on file  . Number of children: Not on file  . Years of education: Not on file  . Highest education level: Not on file  Occupational History  . Not on file  Social Needs  . Financial resource strain: Not on file  . Food insecurity:    Worry: Not on file    Inability: Not on file  . Transportation needs:    Medical: Not on file    Non-medical: Not on file  Tobacco Use  . Smoking status: Never Smoker  . Smokeless tobacco: Never Used  Substance and Sexual Activity  . Alcohol use: Yes    Alcohol/week: 0.0 oz    Comment: 3-4 drinks per week, on occasion  . Drug use: No  . Sexual activity: Not on file  Lifestyle  . Physical activity:    Days per week: Not on file    Minutes per session: Not on file  . Stress: Not on file  Relationships  . Social connections:    Talks on phone: Not on file    Gets together: Not on file    Attends religious service: Not on file    Active member of club or organization: Not on file    Attends meetings of clubs or organizations: Not on file    Relationship status: Not on file  Other Topics Concern  . Not on file  Social History Narrative   Lives with husband and 2 children in a 2 story home.     Works as an Airline pilot part time.     Education: BS degree.   No Known Allergies Family History  Problem Relation Age of Onset    . Heart murmur Mother        Living, 8  . Alzheimer's disease Father        Living, 1  . Vascular Disease Brother   . Healthy Son   . Healthy Daughter      Past medical history, social, surgical and family history all reviewed in electronic medical record.  No pertanent information unless stated regarding to the chief complaint.   Review of Systems:Review of systems updated and as accurate as of 08/31/17  No headache, visual changes, nausea, vomiting, diarrhea, constipation, dizziness, abdominal pain, skin rash, fevers, chills, night sweats, weight loss, swollen lymph nodes, body aches, joint swelling, muscle aches, chest pain, shortness of breath, mood changes.   Objective  Last menstrual period 06/03/2014. Systems examined below as of 08/31/17   General: No apparent distress alert and oriented x3 mood and affect normal, dressed appropriately.  HEENT: Pupils equal, extraocular movements intact  Respiratory: Patient's speak in full sentences and does not appear short of breath  Cardiovascular: No lower extremity edema, non tender, no erythema  Skin: Warm dry intact with no signs  of infection or rash on extremities or on axial skeleton.  Abdomen: Soft nontender  Neuro: Cranial nerves II through XII are intact, neurovascularly intact in all extremities with 2+ DTRs and 2+ pulses.  Lymph: No lymphadenopathy of posterior or anterior cervical chain or axillae bilaterally.  Gait normal with good balance and coordination.  MSK:  Non tender with full range of motion and good stability and symmetric strength and tone of shoulders, elbows, wrist, hip, knee and ankles bilaterally.     Impression and Recommendations:     This case required medical decision making of moderate complexity.      Note: This dictation was prepared with Dragon dictation along with smaller phrase technology. Any transcriptional errors that result from this process are unintentional.

## 2017-09-01 ENCOUNTER — Ambulatory Visit: Payer: Managed Care, Other (non HMO) | Admitting: Family Medicine

## 2017-09-21 ENCOUNTER — Ambulatory Visit: Payer: Managed Care, Other (non HMO) | Admitting: Family Medicine

## 2017-09-27 NOTE — Progress Notes (Signed)
[name] [address] [place], [place] [address] Phone: [phone] Subjective:      CC: Neck and back pain follow-up  BJY:NWGNFAOZHY  Rachel Golden is a 52 y.o. female coming in with complaint of back pain.  Describes the pain as a dull, throbbing aching sensation.  Denies any numbness or tingling overall.  Has some tightness.  Been running on a regular basis at this moment.     Past Medical History:  Diagnosis Date  . Chronic back pain   . Stroke [facility])    Past Surgical History:  Procedure Laterality Date  . NASAL SEPTUM SURGERY     Social History   Socioeconomic History  . Marital status: Married    Spouse name: Not on file  . Number of children: Not on file  . Years of education: Not on file  . Highest education level: Not on file  Occupational History  . Not on file  Social Needs  . Financial resource strain: Not on file  . Food insecurity:    Worry: Not on file    Inability: Not on file  . Transportation needs:    Medical: Not on file    Non-medical: Not on file  Tobacco Use  . Smoking status: Never Smoker  . Smokeless tobacco: Never Used  Substance and Sexual Activity  . Alcohol use: Yes    Alcohol/week: 0.0 oz    Comment: 3-4 drinks per week, on occasion  . Drug use: No  . Sexual activity: Not on file  Lifestyle  . Physical activity:    Days per week: Not on file    Minutes per session: Not on file  . Stress: Not on file  Relationships  . Social connections:    Talks on phone: Not on file    Gets together: Not on file    Attends religious service: Not on file    Active member of club or organization: Not on file    Attends meetings of clubs or organizations: Not on file    Relationship status: Not on file  Other Topics Concern  . Not on file  Social History Narrative   Lives with husband and 2 children in a 2 story home.     Works as an Airline pilot part time.     Education: BS degree.   No Known  Allergies Family History  Problem Relation Age of Onset  . Heart murmur Mother        Living, 86  . Alzheimer's disease Father        Living, 17  . Vascular Disease Brother   . Healthy Son   . Healthy Daughter      Past medical history, social, surgical and family history all reviewed in electronic medical record.  No pertanent information unless stated regarding to the chief complaint.   Review of Systems:Review of systems updated and as accurate as of 09/28/17  No headache, visual changes, nausea, vomiting, diarrhea, constipation, dizziness, abdominal pain, skin rash, fevers, chills, night sweats, weight loss, swollen lymph nodes, body aches, joint swelling, , chest pain, shortness of breath, mood changes.  Positive muscle aches  Objective  Blood pressure 90/70, pulse 77, height 5' (1.524 m), weight 121 lb (54.9 kg), last menstrual period 06/03/2014, SpO2 97 %. Systems examined below as of 09/28/17   General: No apparent distress alert and oriented x3 mood and affect normal, dressed appropriately.  HEENT: Pupils equal, extraocular movements intact  Respiratory: Patient's speak  in full sentences and does not appear short of breath  Cardiovascular: No lower extremity edema, non tender, no erythema  Skin: Warm dry intact with no signs of infection or rash on extremities or on axial skeleton.  Abdomen: Soft nontender  Neuro: Cranial nerves II through XII are intact, neurovascularly intact in all extremities with 2+ DTRs and 2+ pulses.  Lymph: No lymphadenopathy of posterior or anterior cervical chain or axillae bilaterally.  Gait normal with good balance and coordination.  MSK:  Non tender with full range of motion and good stability and symmetric strength and tone of shoulders, elbows, wrist, hip, knee and ankles bilaterally.  Neck: Inspection loss of lordosis. No palpable stepoffs. Negative Spurling's maneuver. Full neck range of motion Grip strength and sensation normal in  bilateral hands Strength good C4 to T1 distribution No sensory change to C4 to T1 Negative Hoffman sign bilaterally Reflexes normal Positive tenderness in the trapezius bilaterally  Back Exam:  Inspection: Loss of lordosis Motion: Flexion 45 deg, Extension 15 deg, Side Bending to 35 deg bilaterally,  Rotation to 35 deg bilaterally  SLR laying: Negative  XSLR laying: Negative  Palpable tenderness: Tender to palpation in the paraspinal musculature lumbar spine right greater than left. FABER: negative. Sensory change: Gross sensation intact to all lumbar and sacral dermatomes.  Reflexes: 2+ at both patellar tendons, 2+ at achilles tendons, Babinski's downgoing.  Strength at foot  Plantar-flexion: 5/5 Dorsi-flexion: 5/5 Eversion: 5/5 Inversion: 5/5  Leg strength  Quad: 5/5 Hamstring: 5/5 Hip flexor: 5/5 Hip abductors: 5/5  Gait unremarkable.  Osteopathic findings T3 extended rotated and side bent right inhaled third rib T7 extended rotated and side bent left L2 flexed rotated and side bent right Sacrum right on right    Impression and Recommendations:     This case required medical decision making of moderate complexity.      Note: This dictation was prepared with Dragon dictation along with smaller phrase technology. Any transcriptional errors that result from this process are unintentional.

## 2017-09-28 ENCOUNTER — Ambulatory Visit: Payer: Managed Care, Other (non HMO) | Admitting: Family Medicine

## 2017-09-28 ENCOUNTER — Encounter: Payer: Self-pay | Admitting: Family Medicine

## 2017-09-28 VITALS — BP 90/70 | HR 77 | Ht 60.0 in | Wt 121.0 lb

## 2017-09-28 DIAGNOSIS — M999 Biomechanical lesion, unspecified: Secondary | ICD-10-CM | POA: Diagnosis not present

## 2017-09-28 DIAGNOSIS — M545 Low back pain, unspecified: Secondary | ICD-10-CM

## 2017-09-28 MED ORDER — IBUPROFEN-FAMOTIDINE 800-26.6 MG PO TABS: ORAL_TABLET | ORAL | 1 refills | Status: DC

## 2017-09-28 NOTE — Patient Instructions (Addendum)
Goo to see you  Rachel Golden is your friend  Exercises 3 times a week.  On wall with heels, butt shoulder and head touching for a goal of 5 minutes daily  Stay active.  See me again in 3 months!

## 2017-09-28 NOTE — Assessment & Plan Note (Signed)
Patient still has some mild tightness but nothing severe.  Responded very well to us by the manipulation.  As long as patient continues with conservative therapy and working on core strengthening patient will follow-up with me every 1 to 2 months.

## 2017-09-28 NOTE — Assessment & Plan Note (Signed)
Decision today to treat with OMT was based on Physical Exam  After verbal consent patient was treated with HVLA, ME, FPR techniques in  thoracic, lumbar and sacral areas  Patient tolerated the procedure well with improvement in symptoms  Patient given exercises, stretches and lifestyle modifications  See medications in patient instructions if given  Patient will follow up in 4-8 weeks 

## 2017-12-09 ENCOUNTER — Encounter: Payer: Self-pay | Admitting: Neurology

## 2017-12-29 ENCOUNTER — Ambulatory Visit: Payer: Managed Care, Other (non HMO) | Admitting: Family Medicine

## 2018-03-23 ENCOUNTER — Encounter: Payer: Self-pay | Admitting: Family Medicine

## 2018-03-23 ENCOUNTER — Encounter

## 2018-03-23 ENCOUNTER — Ambulatory Visit: Payer: Managed Care, Other (non HMO) | Admitting: Family Medicine

## 2018-03-23 VITALS — BP 102/72 | HR 88 | Ht 60.0 in | Wt 121.0 lb

## 2018-03-23 DIAGNOSIS — M5416 Radiculopathy, lumbar region: Secondary | ICD-10-CM | POA: Diagnosis not present

## 2018-03-23 DIAGNOSIS — M999 Biomechanical lesion, unspecified: Secondary | ICD-10-CM

## 2018-03-23 NOTE — Assessment & Plan Note (Signed)
Decision today to treat with OMT was based on Physical Exam  After verbal consent patient was treated with HVLA, ME, FPR techniques in  thoracic, lumbar and sacral areas  Patient tolerated the procedure well with improvement in symptoms  Patient given exercises, stretches and lifestyle modifications  See medications in patient instructions if given  Patient will follow up in 4-8 weeks 

## 2018-03-23 NOTE — Progress Notes (Signed)
Tawana ScaleZach Smith D.O. [facility] [address] [place], [place] [address] Phone: [phone] Subjective:   [name], Valerie Wolf, am serving as a scribe for Dr. Antoine PrimasZachary Smith.   CC: back pain follow-up  BJY:NWGNFAOZHYHPI:Subjective  Grayland Ormondndrea Ginty is a 52 y.o. female coming in with complaint of back pain. She is here for OMT today. No change in her pani since last visit. She states that she needs a tune up. Patient states that she has been doing relatively well overall.  Has done multiple races since we have seen her.  No significant discomfort overall.  Doing well.      Past Medical History:  Diagnosis Date  . Chronic back pain   . Stroke [facility](HCC)    Past Surgical History:  Procedure Laterality Date  . NASAL SEPTUM SURGERY     Social History   Socioeconomic History  . Marital status: Married    Spouse name: Not on file  . Number of children: Not on file  . Years of education: Not on file  . Highest education level: Not on file  Occupational History  . Not on file  Social Needs  . Financial resource strain: Not on file  . Food insecurity:    Worry: Not on file    Inability: Not on file  . Transportation needs:    Medical: Not on file    Non-medical: Not on file  Tobacco Use  . Smoking status: Never Smoker  . Smokeless tobacco: Never Used  Substance and Sexual Activity  . Alcohol use: Yes    Alcohol/week: 0.0 standard drinks    Comment: 3-4 drinks per week, on occasion  . Drug use: No  . Sexual activity: Not on file  Lifestyle  . Physical activity:    Days per week: Not on file    Minutes per session: Not on file  . Stress: Not on file  Relationships  . Social connections:    Talks on phone: Not on file    Gets together: Not on file    Attends religious service: Not on file    Active member of club or organization: Not on file    Attends meetings of clubs or organizations: Not on file    Relationship status: Not on file  Other Topics Concern  . Not on file  Social  History Narrative   Lives with husband and 2 children in a 2 story home.     Works as an Airline pilotaccountant part time.     Education: BS degree.   No Known Allergies Family History  Problem Relation Age of Onset  . Heart murmur Mother        Living, 10779  . Alzheimer's disease Father        Living, 1080  . Vascular Disease Brother   . Healthy Son   . Healthy Daughter        Current Outpatient Medications (Analgesics):  .  aspirin EC 81 MG tablet, Take 1 tablet (81 mg total) by mouth daily. .  Ibuprofen-Famotidine 800-26.6 MG TABS, Take 1 tablet 3 times daily.   Current Outpatient Medications (Other):  Marland Kitchen.  Cholecalciferol (VITAMIN D3) 1000 units CAPS, Take 2,000 Units by mouth. .  gabapentin (NEURONTIN) 100 MG capsule, Take 2 capsules (200 mg total) by mouth at bedtime.    Past medical history, social, surgical and family history all reviewed in electronic medical record.  No pertanent information unless stated regarding to the chief complaint.   Review of Systems:  No headache, visual changes, nausea, vomiting, diarrhea, constipation, dizziness, abdominal pain, skin rash, fevers, chills, night sweats, weight loss, swollen lymph nodes, body aches, joint swelling,  chest pain, shortness of breath, mood changes.  Positive muscle aches  Objective  Blood pressure 102/72, pulse 88, height 5' (1.524 m), weight 121 lb (54.9 kg), last menstrual period 06/03/2014, SpO2 98 %.    General: No apparent distress alert and oriented x3 mood and affect normal, dressed appropriately.  HEENT: Pupils equal, extraocular movements intact  Respiratory: Patient's speak in full sentences and does not appear short of breath  Cardiovascular: No lower extremity edema, non tender, no erythema  Skin: Warm dry intact with no signs of infection or rash on extremities or on axial skeleton.  Abdomen: Soft nontender  Neuro: Cranial nerves II through XII are intact, neurovascularly intact in all extremities with 2+ DTRs  and 2+ pulses.  Lymph: No lymphadenopathy of posterior or anterior cervical chain or axillae bilaterally.  Gait normal with good balance and coordination.  MSK:  Non tender with full range of motion and good stability and symmetric strength and tone of shoulders, elbows, wrist, hip, knee and ankles bilaterally.  Back Exam:  Inspection: Unremarkable  Motion: Flexion 45 deg, Extension 20 deg, Side Bending to 35 deg bilaterally,  Rotation to 35 deg bilaterally  SLR laying: Negative  XSLR laying: Negative  Palpable tenderness: Mild tenderness in the paraspinal musculature lumbosacral area right greater than left. FABER: negative. Sensory change: Gross sensation intact to all lumbar and sacral dermatomes.  Reflexes: 2+ at both patellar tendons, 2+ at achilles tendons, Babinski's downgoing.  Strength at foot  Plantar-flexion: 5/5 Dorsi-flexion: 5/5 Eversion: 5/5 Inversion: 5/5  Leg strength  Quad: 5/5 Hamstring: 5/5 Hip flexor: 5/5 Hip abductors: 5/5  Gait unremarkable.  Osteopathic findings  T3 extended rotated and side bent right inhaled third rib T9 extended rotated and side bent left L2 flexed rotated and side bent right Sacrum right on right     Impression and Recommendations:     This case required medical decision making of moderate complexity. The above documentation has been reviewed and is accurate and complete [name], DO       Note: This dictation was prepared with Dragon dictation along with smaller phrase technology. Any transcriptional errors that result from this process are unintentional.

## 2018-03-23 NOTE — Patient Instructions (Addendum)
Good to see you  Ice is your friend Stay active Glad you are doing so well  Happy holidays!  See me again in 6-7 weeks!

## 2018-03-23 NOTE — Assessment & Plan Note (Signed)
Has had radicular symptoms in the past.  Discussed icing regimen and home exercise.  Discussed which activities to do which wants to avoid.  Increase activity slowly over the course the next 5 days.  Follow-up again with me in 4 to 8 weeks

## 2018-05-04 ENCOUNTER — Ambulatory Visit: Payer: Managed Care, Other (non HMO) | Admitting: Family Medicine

## 2018-05-17 NOTE — Progress Notes (Signed)
[facility] [address] [place], [place] [address] Phone: [phone] Subjective:    I Ronelle Nigh am serving as a Neurosurgeon for Dr. Antoine Primas.   I'm seeing this patient by the request  of:    CC: Back pain  IRW:[id number]  Rachel Golden is a 53 y.o. female coming in with complaint of back pain. Posterior left knee pain. States that sometimes she gets a sharp pain with squatting and flexion.   Patient has been seen for back pain previously.  Has had tight hip flexors.  Is an comp runner.  Has been doing relatively well but just more tightness.  Patient denies any radiation of pain which patient has had previously.  Patient is also having more of the posterior left knee pain.  Has been seen previously and has had more of a Hande strength tendinopathy.  Has done formal physical therapy for.  States that it is a little bit worse.  Patient states sometimes it catches or even at rest.  Sometimes an aching sensation in her neck.  This is a little different than just the hamstrings difficulty she has had previously.  Does not remember any true injury.   Positive history of fibromuscular dysplasia  Past Medical History:  Diagnosis Date  . Chronic back pain   . Stroke [facility])    Past Surgical History:  Procedure Laterality Date  . NASAL SEPTUM SURGERY     Social History   Socioeconomic History  . Marital status: Married    Spouse name: Not on file  . Number of children: Not on file  . Years of education: Not on file  . Highest education level: Not on file  Occupational History  . Not on file  Social Needs  . Financial resource strain: Not on file  . Food insecurity:    Worry: Not on file    Inability: Not on file  . Transportation needs:    Medical: Not on file    Non-medical: Not on file  Tobacco Use  . Smoking status: Never Smoker  . Smokeless tobacco: Never Used  Substance and Sexual Activity  . Alcohol use: Yes    Alcohol/week: 0.0  standard drinks    Comment: 3-4 drinks per week, on occasion  . Drug use: No  . Sexual activity: Not on file  Lifestyle  . Physical activity:    Days per week: Not on file    Minutes per session: Not on file  . Stress: Not on file  Relationships  . Social connections:    Talks on phone: Not on file    Gets together: Not on file    Attends religious service: Not on file    Active member of club or organization: Not on file    Attends meetings of clubs or organizations: Not on file    Relationship status: Not on file  Other Topics Concern  . Not on file  Social History Narrative   Lives with husband and 2 children in a 2 story home.     Works as an Airline pilot part time.     Education: BS degree.   No Known Allergies Family History  Problem Relation Age of Onset  . Heart murmur Mother        Living, 30  . Alzheimer's disease Father        Living, 53  . Vascular Disease Brother   . Healthy Son   . Healthy Daughter  Current Outpatient Medications (Analgesics):  .  aspirin EC 81 MG tablet, Take 1 tablet (81 mg total) by mouth daily. .  Ibuprofen-Famotidine 800-26.6 MG TABS, Take 1 tablet 3 times daily.   Current Outpatient Medications (Other):  Marland Kitchen.  Cholecalciferol (VITAMIN D3) 1000 units CAPS, Take 2,000 Units by mouth. .  gabapentin (NEURONTIN) 100 MG capsule, Take 2 capsules (200 mg total) by mouth at bedtime.    Past medical history, social, surgical and family history all reviewed in electronic medical record.  No pertanent information unless stated regarding to the chief complaint.   Review of Systems:  No headache, visual changes, nausea, vomiting, diarrhea, constipation, dizziness, abdominal pain, skin rash, fevers, chills, night sweats, weight loss, swollen lymph nodes, body aches, joint swelling, , chest pain, shortness of breath, mood changes.  Positive muscle aches  Objective  Blood pressure 102/74, pulse 75, height 5' (1.524 m), weight 122 lb (55.3  kg), last menstrual period 06/03/2014, SpO2 97 %.   General: No apparent distress alert and oriented x3 mood and affect normal, dressed appropriately.  HEENT: Pupils equal, extraocular movements intact  Respiratory: Patient's speak in full sentences and does not appear short of breath  Cardiovascular: No lower extremity edema, non tender, no erythema  Skin: Warm dry intact with no signs of infection or rash on extremities or on axial skeleton.  Abdomen: Soft nontender  Neuro: Cranial nerves II through XII are intact, neurovascularly intact in all extremities with 2+ DTRs and 2+ pulses.  Lymph: No lymphadenopathy of posterior or anterior cervical chain or axillae bilaterally.  Gait normal with good balance and coordination.  MSK:  Non tender with full range of motion and good stability and symmetric strength and tone of shoulders, elbows, wrist, hip, and ankles bilaterally.  Left knee exam shows the patient does have near full range of motion.  Severe tenderness along noted in the popliteal fossa.  This seems to be midline.  Mild pain with compression of the calf as well.  Hamstrings moderately tight but no significant discomfort today.  Mild pain over the medial joint line of the knee  Osteopathic findings T3 extended rotated and side bent right inhaled third rib T7 extended rotated and side bent left L2 flexed rotated and side bent right Sacrum right on right   Limited musculoskeletal ultrasound was performed and interpreted by Judi SaaZachary M Chesnee Floren  Limited ultrasound of patient's left knee show some mild dilation of the venous structures of the posterior aspect of the knee but does seem to be compressible.  Still has abnormality that is not quite known known.  No true masses appreciated.  Imaging tendon on the medial aspect of the joint line does have some Doppler flow that is increased.  Mild degenerative meniscal tear also noted. Impression: Meniscal tear abnormality in the blood vessels on the  popliteal fossa   Impression and Recommendations:     This case required medical decision making of moderate complexity. The above documentation has been reviewed and is accurate and complete Judi SaaZachary M Humna Moorehouse, DO       Note: This dictation was prepared with Dragon dictation along with smaller phrase technology. Any transcriptional errors that result from this process are unintentional.

## 2018-05-18 ENCOUNTER — Encounter: Payer: Self-pay | Admitting: Family Medicine

## 2018-05-18 ENCOUNTER — Ambulatory Visit: Payer: Self-pay

## 2018-05-18 ENCOUNTER — Ambulatory Visit: Payer: Managed Care, Other (non HMO) | Admitting: Family Medicine

## 2018-05-18 VITALS — BP 102/74 | HR 75 | Ht 60.0 in | Wt 122.0 lb

## 2018-05-18 DIAGNOSIS — S83242A Other tear of medial meniscus, current injury, left knee, initial encounter: Secondary | ICD-10-CM

## 2018-05-18 DIAGNOSIS — M999 Biomechanical lesion, unspecified: Secondary | ICD-10-CM | POA: Diagnosis not present

## 2018-05-18 DIAGNOSIS — G8929 Other chronic pain: Secondary | ICD-10-CM | POA: Diagnosis not present

## 2018-05-18 DIAGNOSIS — I773 Arterial fibromuscular dysplasia: Secondary | ICD-10-CM | POA: Diagnosis not present

## 2018-05-18 DIAGNOSIS — M25562 Pain in left knee: Principal | ICD-10-CM

## 2018-05-18 DIAGNOSIS — S76302A Unspecified injury of muscle, fascia and tendon of the posterior muscle group at thigh level, left thigh, initial encounter: Secondary | ICD-10-CM

## 2018-05-18 NOTE — Assessment & Plan Note (Signed)
New meniscal tear but no significant displacement.  Patient does have the hamstring tendinopathy also still in the area.  Patient though with a history of fibrous dysplasia I am concerned and need to rule out any deep venous thrombosis.  Ultrasound ordered.  We discussed with patient about increasing her aspirin until she has this test done.  Worsening symptoms she should seek medical attention immediately.  Patient has denied any chest pain or any shortness of breath.  Patient will start with home exercises given to her by athletic trainer today.  Discussed topical anti-inflammatories and avoiding twisting motions.  Patient is okay to run as long as it does not cause more discomfort.  Follow-up again in 4 weeks

## 2018-05-18 NOTE — Assessment & Plan Note (Signed)
Decision today to treat with OMT was based on Physical Exam  After verbal consent patient was treated with HVLA, ME, FPR techniques in  thoracic, lumbar and sacral areas  Patient tolerated the procedure well with improvement in symptoms  Patient given exercises, stretches and lifestyle modifications  See medications in patient instructions if given  Patient will follow up in 4-6 weeks 

## 2018-05-18 NOTE — Patient Instructions (Signed)
Good to see you  We will get doppler and they should call you  Thigh compression sleeve with working out  Add the heel lift again  New exercises for the knee  See me again in 4 weeks and if not better we will try knee injection

## 2018-05-19 ENCOUNTER — Encounter (HOSPITAL_COMMUNITY): Payer: Self-pay

## 2018-05-19 ENCOUNTER — Ambulatory Visit (HOSPITAL_COMMUNITY)
Admission: RE | Admit: 2018-05-19 | Discharge: 2018-05-19 | Disposition: A | Discharge status: Home / Self Care | Payer: Managed Care, Other (non HMO) | Source: Ambulatory Visit | Attending: Cardiology | Admitting: Cardiology

## 2018-05-19 DIAGNOSIS — G8929 Other chronic pain: Secondary | ICD-10-CM | POA: Diagnosis present

## 2018-05-19 DIAGNOSIS — M25562 Pain in left knee: Secondary | ICD-10-CM | POA: Diagnosis present

## 2018-05-19 NOTE — Progress Notes (Signed)
Left lower venous has been completed and is negative for DVT. Preliminary results can be found under CV proc through chart review.  Elmarie Devlin RVT [facility]  

## 2018-05-19 NOTE — Progress Notes (Signed)
Follow-up Visit   Date: 05/20/18    Rachel Golden MRN: [id number] DOB: 1965/11/02   Interim History: Rachel Golden is a 53 y.o. right-handed Caucasian female with chronic back pain, fibromuscular dysplasia and stroke for follow-up of FMD. The patient was accompanied to the clinic by self.  History of present illness: On May 13th 2016, she was picking up a hanger off the floor and developed acute onset of dizziness.  She initially thought it was because she got up too quickly.  She then heard a "swooshing" sound in her head and developed a piercing headache. Swooshing sound resolved within 5 minutes.  Within 30-minutes, she developed right hand tingling and numbness of the lips.  She called 911 who evaluated her and did not feel that her symptoms were consistent with a stroke, so was not taken to the emergency department.  She also recalls having neck pain especially with neck rotation to the right. On May 19th, she was getting out of the car and developed neck stiffness and heard a swooshing sound in her right ear with associated brief but severe headache.  She also has associated vision disturbance described as "kelidoscope" vision, dizzy, and nauseous. She had difficulty walking out of her car to her home, but was able to make it into her home slowly.  Symptoms lasted about 10-minutes.   She again went to see Dr. [name] on the same day who ordered MRI/A of her head.  Imaging shows small infarcts involving the right cerebellum and medial right parieto-occipital region. Vessel imaging was shows vascular irregularity involving bilateral vertebral arteries with ?right dissection, concerning for fibromuscular dysplasia but I do not appreciate this well on my review.  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  Her stroke work-up has been returning largely normal including echocardiogram, holter monitor, and vasculitis/hypercoagulable labs. She was very happy with her evaluation with Dr. Kathyrn Sheriff who  suggested that only if symptoms worsen or she develops new headache, would cerebral angiogram be indicated.    She was started on aspirin 367m and lipitor 243mdaily.  No personal or family history of stroke.  No personal history of hypertension.  Of note, she has received cervical manipulation for stiffness this month, which alleviated her neck discomfort.  UPDATE 05/20/2018:  She is here for follow-up visit.  She denies any new neurological symptoms, such as headaches, tingling, or sharp pain.  She continues to have intermittent neck pain which is alleviated with NSAIDs and stretching.  She is staying very active and is doing a personal challenge with her husband to run 2020 miles this year.  She is experiencing episodic posterior knee pain which is followed by Dr. SmTamala Julian  Medications:  Current Outpatient Medications on File Prior to Visit  Medication Sig Dispense Refill  . aspirin EC 81 MG tablet Take 1 tablet (81 mg total) by mouth daily.    . Cholecalciferol (VITAMIN D3) 1000 units CAPS Take 2,000 Units by mouth.    . Glucosamine Sulfate (SYNOVACIN PO) Glucosamine    . Ibuprofen-Famotidine 800-26.6 MG TABS Take 1 tablet 3 times daily. 270 tablet 1   No current facility-administered medications on file prior to visit.     Allergies: No Known Allergies  Review of Systems:  CONSTITUTIONAL: No fevers, chills, night sweats, or weight loss.  EYES: No visual changes or eye pain ENT: No hearing changes.  No history of nose bleeds.   RESPIRATORY: No cough, wheezing and shortness of breath.   CARDIOVASCULAR:  Negative for chest pain, and palpitations.   GI: Negative for abdominal discomfort, blood in stools or black stools.  No recent change in bowel habits.   GU:  No history of incontinence.   MUSCLOSKELETAL: No history of joint pain or swelling.  No myalgias.   SKIN: Negative for lesions, rash, and itching.   ENDOCRINE: Negative for cold or heat intolerance, polydipsia or goiter.   PSYCH:   No depression or anxiety symptoms.   NEURO: As Above.   Vital Signs:  BP 104/74   Pulse 96   Ht 5' (1.524 m)   Wt 121 lb 8 oz (55.1 kg)   LMP 06/03/2014 (Approximate)   SpO2 98%   BMI 23.73 kg/m   General Medical Exam:   General:  Well appearing, comfortable  Eyes/ENT: see cranial nerve examination.   Neck: No masses appreciated.  Full range of motion without tenderness.  No carotid bruits. Respiratory:  Clear to auscultation, good air entry bilaterally.   Cardiac:  Regular rate and rhythm, no murmur.   Ext:  No edema  Neurological Exam: MENTAL STATUS including orientation to time, place, person, recent and remote memory, attention span and concentration, language, and fund of knowledge is normal.  Speech is not dysarthric.  CRANIAL NERVES: Pupils equal round and reactive to light. No Horner's syndrome.  Normal conjugate, extra-ocular eye movements in all directions of gaze.  No ptosis. Normal facial sensation.  Face is symmetric. Palate elevates symmetrically.  Tongue is midline.  MOTOR:  Motor strength is 5/5 in all extremities.  Tone is normal.  No pronator drift.  MSRs:  Reflexes are brisk and symmetric 3+/4 throughout.  SENSORY:  Intact to vibration.  COORDINATION/GAIT:    Gait narrow based and stable.  Data: Labs 08/23/2014:  CRP <0.1, ESR 12, ANA neg, HbA1c 5.5, LDL 55, HDL 76, C3/C4 normal, ENA neg, ANA neg, RF neg, hypercoagulable panel negative (normal protein C and S activity, no Factor V leiden mutation, normal antithrombin III and anticardiolipin antibody)  Surface echocardiogram 08/31/2014:  normal, EF 60% no thrombus  08/30/2014  48-hr holter:   No significant arrhythmias.  No atrial fib or etiology for an embolic neuro event.  Rare atrial and ventricular ectopy noted.  Predominant rhythm is sinus.     MRI brain 08/27/2014:  Subacute nonhemorrhagic small infarcts involving mid to inferior right cerebellum and medial aspect of the right parietal -occipital lobe.   No intracranial hemorrhage.  MRA head 08/27/2014:  Ectatic cavernous segment internal carotid artery bilaterally. Mild narrowing petrous segment of the internal carotid artery bilaterally. Middle cerebral mild to moderate branch vessel narrowing and irregularity bilaterally greater on the right.   1.6 x 1.6 mm bulge M1 segment right middle cerebral artery appears to be origin of a vessel rather than saccular aneurysm. Stability can be confirmed on follow-up.   Moderate narrowing at the junction of the A1 segment and A2 segment of the right anterior cerebral artery.  Ectatic vertebral arteries and basilar artery without high-grade stenosis.  Full extent of the left posterior inferior cerebellar artery not imaged on present exam. This is noted on the contrast-enhanced MR angiogram of the neck and appears ectatic were proximal narrowing.  Nonvisualized left anterior inferior cerebellar artery.  Narrowing and irregularity superior cerebellar artery greater on the left.  Posterior cerebral artery branch vessel narrowing and irregularity greater on the left.   MRA NECK FINDINGS Ectatic pleated dilated appearance of the vertebral arteries bilaterally suggestive of fibromuscular dysplasia. With the patient's  subacute right sided infarcts, subtle focal dissection with subsequent embolic thrombus cannot be entirely excluded although not detected on the current exam   No significant stenosis of either carotid bifurcation. Ectatic internal carotid artery cervical segment bilaterally may also reflect changes of fibromuscular dysplasia but without pleated appearance as noted involving the vertebral arteries.   MRA head 05/03/2015: 1. 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 No associated stenosis. The bilateral V4 segments appear spared. 2. Bilateral ICA  dolichoectasia, without the cervical ICA beaded appearance typical of carotid FMD. No carotid stenosis. 3.  Negative intracranial MRA aside from ICA siphon tortuosity.  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  She has been doing great and does not have any residual symptoms.  Vascular risk factors are well controlled.  She is due for surveillance imaging of intra-and extracranial vessels.   MRA head and neck will be ordered.  Continue aspirin 81 mg daily. Neck precautions were again discussed.    Return to clinic in 1 year.    Thank you for allowing me to participate in patient's care.  If I can answer any additional questions, I would be pleased to do so.    Sincerely,     K. Posey Pronto, DO

## 2018-05-20 ENCOUNTER — Encounter: Payer: Self-pay | Admitting: Neurology

## 2018-05-20 ENCOUNTER — Ambulatory Visit: Payer: Managed Care, Other (non HMO) | Admitting: Neurology

## 2018-05-20 VITALS — BP 104/74 | HR 96 | Ht 60.0 in | Wt 121.5 lb

## 2018-05-20 DIAGNOSIS — Z8673 Personal history of transient ischemic attack (TIA), and cerebral infarction without residual deficits: Secondary | ICD-10-CM

## 2018-05-20 DIAGNOSIS — I773 Arterial fibromuscular dysplasia: Secondary | ICD-10-CM | POA: Diagnosis not present

## 2018-05-20 NOTE — Patient Instructions (Addendum)
Keep up the great work!  MRA head and neck  Continue aspirin 81mg  daily  Return to clinic in 1 year

## 2018-05-31 ENCOUNTER — Telehealth: Payer: Self-pay | Admitting: Neurology

## 2018-05-31 NOTE — Telephone Encounter (Signed)
Informed patient that I will send MRI to [facility] in [place] per her request.

## 2018-05-31 NOTE — Telephone Encounter (Signed)
Patient is calling in about how [place] insurance would be cheaper to have her MRI done at [facility] by like $1000. Please see if you can send the orders to them. Thanks!

## 2018-06-01 ENCOUNTER — Telehealth: Payer: Self-pay | Admitting: Neurology

## 2018-06-01 NOTE — Telephone Encounter (Signed)
Dawn called regarding this patient and that they are wanting to change to [facility]  [facility]aging. The fax # is [phone]. They will need a new order faxed. Thanks

## 2018-06-02 NOTE — Telephone Encounter (Signed)
Order signed by Dr. Allena Katz and faxed to number provided with confirmation received.

## 2018-06-02 NOTE — Telephone Encounter (Signed)
Dawn is calling from [place] regarding orders for MRA with contract and then MRA of neck with and without contrast. She said these havent been ordered and the orders need to be faxed over to [phone]. Thanks!

## 2018-06-03 ENCOUNTER — Other Ambulatory Visit: Payer: Self-pay

## 2018-06-06 ENCOUNTER — Other Ambulatory Visit: Payer: Self-pay | Admitting: *Deleted

## 2018-06-06 DIAGNOSIS — Z8673 Personal history of transient ischemic attack (TIA), and cerebral infarction without residual deficits: Secondary | ICD-10-CM

## 2018-06-06 DIAGNOSIS — M5481 Occipital neuralgia: Secondary | ICD-10-CM

## 2018-06-15 ENCOUNTER — Ambulatory Visit: Payer: Managed Care, Other (non HMO) | Admitting: Family Medicine

## 2018-06-22 ENCOUNTER — Telehealth: Payer: Self-pay | Admitting: Neurology

## 2018-06-22 NOTE — Telephone Encounter (Signed)
Left message giving patient results and requested for her to have the CD mailed to [place].

## 2018-06-22 NOTE — Telephone Encounter (Signed)
MRA head and neck 06/20/2018 performed at [facility]: 1.  Multifocal irregularity of the V2 and V3 segments of both vertebral arteries, which may relate to fibromuscular dysplasia.  No evidence of significant stenosis or occlusion.  Prior outside imaging 127/2017 is not available for direct comparison to assess for progression of these findings. 2.  Extensive tortuosity of the cervical and cavernous bilateral internal carotid arteries, which is nonspecific but can be seen with longstanding hypertension. 3.  No evidence of significant proximal artery narrowing in the head or neck.  Please request patient to mail CD with images so I can compare.  Overall, her vessel imaging appears stable and continues to show fibromusclar dysplasia.   Creed Kail K. Allena Katz, DO

## 2018-07-05 ENCOUNTER — Ambulatory Visit: Payer: Self-pay | Admitting: Family Medicine

## 2018-07-22 ENCOUNTER — Telehealth: Payer: Self-pay | Admitting: Neurology

## 2018-07-22 ENCOUNTER — Telehealth: Payer: Self-pay | Admitting: *Deleted

## 2018-07-22 NOTE — Telephone Encounter (Signed)
Informed patient that we have not seen it yet.  She will call them to see if it was sent.

## 2018-07-22 NOTE — Telephone Encounter (Signed)
Did you receive this?

## 2018-07-22 NOTE — Telephone Encounter (Signed)
I have not received it. 

## 2018-07-22 NOTE — Telephone Encounter (Signed)
Copied from CRM [id number]. Topic: General - Inquiry >> Jul 22, 2018  8:49 AM Crist Infante wrote: Reason for CRM:  pt states since she cannot com into see Dr Katrinka Blazing right not, she is considering buying an "Accumat" Pt is wondering what Dr Katrinka Blazing think about that? Is that something that would be ok?  She can buy on [employer] for around $20. Please advise

## 2018-07-22 NOTE — Telephone Encounter (Signed)
Patient called to make sure you received her CD from her imaging she had done at [facility] on 06/18/2018? Please Call to Confirm. Thanks

## 2018-07-25 NOTE — Telephone Encounter (Signed)
Worth a shot!

## 2018-07-25 NOTE — Telephone Encounter (Signed)
Please inform patient that CD was received and reviewed.  MRA is stable and continues to show fibromuscular dysplasia.  No new findings.

## 2018-07-26 ENCOUNTER — Encounter: Payer: Self-pay | Admitting: *Deleted

## 2018-07-26 NOTE — Telephone Encounter (Signed)
Results sent via My Chart.  

## 2018-08-02 ENCOUNTER — Ambulatory Visit: Payer: Self-pay | Admitting: Family Medicine

## 2018-08-12 ENCOUNTER — Telehealth: Payer: Self-pay | Admitting: *Deleted

## 2018-08-12 NOTE — Telephone Encounter (Signed)
Patient notified that her MRA has been reviewed by Dr. Allena Katz and remains stable.

## 2018-08-24 ENCOUNTER — Ambulatory Visit: Payer: Self-pay | Admitting: Family Medicine

## 2019-01-25 ENCOUNTER — Ambulatory Visit: Payer: Self-pay

## 2019-01-25 ENCOUNTER — Ambulatory Visit (INDEPENDENT_AMBULATORY_CARE_PROVIDER_SITE_OTHER): Payer: Managed Care, Other (non HMO) | Admitting: Family Medicine

## 2019-01-25 ENCOUNTER — Encounter: Payer: Self-pay | Admitting: Family Medicine

## 2019-01-25 ENCOUNTER — Other Ambulatory Visit: Payer: Self-pay

## 2019-01-25 VITALS — BP 94/68 | HR 85 | Ht 60.0 in | Wt 119.0 lb

## 2019-01-25 DIAGNOSIS — M67431 Ganglion, right wrist: Secondary | ICD-10-CM | POA: Insufficient documentation

## 2019-01-25 DIAGNOSIS — M542 Cervicalgia: Secondary | ICD-10-CM

## 2019-01-25 DIAGNOSIS — M25531 Pain in right wrist: Secondary | ICD-10-CM

## 2019-01-25 DIAGNOSIS — M999 Biomechanical lesion, unspecified: Secondary | ICD-10-CM

## 2019-01-25 NOTE — Progress Notes (Signed)
[facility] [address] [place], [place] [address] Phone: [phone] Subjective:    I'm seeing this patient by the request  of:    CC: L scapular/UT pain and R wrist pain  BTD:[id number]    05/18/18:   Update- 01/25/19 Rachel Golden is a 53 y.o. female coming in with complaint of L scapular pain that is aggravated w. L cervical rotation.  It has been bothering her for approximately 1.5 weeks.  She denies any MOI.  She has been stretching and using an IcyHot roller ball and taking Duexis at night.  She is also c/o R wrist pain when trying to do push-ups.  Pain radiates proximally into the R forearm.  Pt denies any mechanical symptoms or swelling.  She states this pain has bothered her for a few years.      Past Medical History:  Diagnosis Date  . Chronic back pain   . Stroke [facility])    Past Surgical History:  Procedure Laterality Date  . NASAL SEPTUM SURGERY     Social History   Socioeconomic History  . Marital status: Married    Spouse name: Not on file  . Number of children: Not on file  . Years of education: Not on file  . Highest education level: Not on file  Occupational History  . Not on file  Social Needs  . Financial resource strain: Not on file  . Food insecurity    Worry: Not on file    Inability: Not on file  . Transportation needs    Medical: Not on file    Non-medical: Not on file  Tobacco Use  . Smoking status: Never Smoker  . Smokeless tobacco: Never Used  Substance and Sexual Activity  . Alcohol use: Yes    Alcohol/week: 0.0 standard drinks    Comment: 3-4 drinks per week, on occasion  . Drug use: No  . Sexual activity: Not on file  Lifestyle  . Physical activity    Days per week: Not on file    Minutes per session: Not on file  . Stress: Not on file  Relationships  . Social Musician on phone: Not on file    Gets together: Not on file    Attends religious service: Not on file    Active member of  club or organization: Not on file    Attends meetings of clubs or organizations: Not on file    Relationship status: Not on file  Other Topics Concern  . Not on file  Social History Narrative   Lives with husband and 2 children in a 2 story home.     Works as an Airline pilot part time.     Education: BS degree.   No Known Allergies Family History  Problem Relation Age of Onset  . Heart murmur Mother        Living, 46  . Alzheimer's disease Father        Living, 69  . Vascular Disease Brother   . Healthy Son   . Healthy Daughter        Current Outpatient Medications (Analgesics):  .  aspirin EC 81 MG tablet, Take 1 tablet (81 mg total) by mouth daily. .  Ibuprofen-Famotidine 800-26.6 MG TABS, Take 1 tablet 3 times daily.   Current Outpatient Medications (Other):  Marland Kitchen  Cholecalciferol (VITAMIN D3) 1000 units CAPS, Take 2,000 Units by mouth. .  Glucosamine Sulfate (SYNOVACIN PO), Glucosamine  Past medical history, social, surgical and family history all reviewed in electronic medical record.  No pertanent information unless stated regarding to the chief complaint.   Review of Systems:  No headache, visual changes, nausea, vomiting, diarrhea, constipation, dizziness, abdominal pain, skin rash, fevers, chills, night sweats, weight loss, swollen lymph nodes, body aches, joint swelling, muscle aches, chest pain, shortness of breath, mood changes.   Objective  Last menstrual period 06/03/2014. Systems examined below as of    General: No apparent distress alert and oriented x3 mood and affect normal, dressed appropriately.  HEENT: Pupils equal, extraocular movements intact  Respiratory: Patient's speak in full sentences and does not appear short of breath  Cardiovascular: No lower extremity edema, non tender, no erythema  Skin: Warm dry intact with no signs of infection or rash on extremities or on axial skeleton.  Abdomen: Soft nontender  Neuro: Cranial nerves II through XII  are intact, neurovascularly intact in all extremities with 2+ DTRs and 2+ pulses.  Lymph: No lymphadenopathy of posterior or anterior cervical chain or axillae bilaterally.  Gait normal with good balance and coordination.  MSK:  Non tender with full range of motion and good stability and symmetric strength and tone of shoulders, elbows, , hip, knee and ankles bilaterally.   Back exam does have some tightness milligrams left parascapular region.  Very mild scapular dyskinesis noted   patient's right wrist does show the patient has a mild cyst noted on the dorsal aspect of the wrist.  Mild tenderness over this area.  Mild pain with resisted TFCC.  Limited musculoskeletal ultrasound was performed interpreted by me Lyndal Pulley  Limited ultrasound shows the patient does have a ganglion cyst noted.  Degenerative tearing of the TFCC noted but no significant displacement.  Mild increase in hypoechoic changes in the surrounding area.  No cortical irregularity of any of the bones. 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 The above documentation has been reviewed and is accurate and complete Lyndal Pulley, DO       Note: This dictation was prepared with Dragon dictation along with smaller phrase technology. Any transcriptional errors that result from this process are unintentional.

## 2019-01-25 NOTE — Patient Instructions (Addendum)
See me back in 6-8 weeks.  Keep hands in neutral position.  Back- focus on posture and keep things at eye level.

## 2019-01-25 NOTE — Assessment & Plan Note (Signed)
Patient does have a ganglion cyst.  Did not want aspiration today.  Will consider in the future.  Discussed icing regimen, taping,Avoiding certain extension of the wrist.  Follow-up again in 4 to 8 weeks

## 2019-01-25 NOTE — Assessment & Plan Note (Signed)
Postural instability.  Discussed which activities of doing which wants to avoid.  Patient is to increase activity slowly.  Discussed which activities to avoid.  Discussed ergonomics throughout the day including a computer screen.  Follow-up again in 4 to 8 weeks

## 2019-01-25 NOTE — Assessment & Plan Note (Signed)
Decision today to treat with OMT was based on Physical Exam  After verbal consent patient was treated with HVLA, ME, FPR techniques in rib, thoracic, lumbar and sacral areas  tolerated the procedure well with improvement in symptoms  Patient given exercises, stretches and lifestyle modifications  See medications in patient instructions if given  Patient will follow up in 12 weeks

## 2019-02-13 ENCOUNTER — Other Ambulatory Visit: Payer: Self-pay

## 2019-02-13 DIAGNOSIS — Z20822 Contact with and (suspected) exposure to covid-19: Secondary | ICD-10-CM

## 2019-02-14 LAB — NOVEL CORONAVIRUS, NAA: SARS-CoV-2, NAA: NOT DETECTED

## 2019-03-22 ENCOUNTER — Ambulatory Visit: Payer: Managed Care, Other (non HMO) | Admitting: Family Medicine

## 2019-04-19 ENCOUNTER — Ambulatory Visit: Payer: Managed Care, Other (non HMO) | Admitting: Family Medicine

## 2019-05-24 ENCOUNTER — Ambulatory Visit: Payer: Managed Care, Other (non HMO) | Admitting: Neurology

## 2019-06-12 ENCOUNTER — Ambulatory Visit: Payer: Managed Care, Other (non HMO) | Attending: Internal Medicine

## 2019-06-12 DIAGNOSIS — Z23 Encounter for immunization: Secondary | ICD-10-CM

## 2019-06-12 NOTE — Progress Notes (Signed)
   Covid-19 Vaccination Clinic  Name:  Rachel Golden    MRN: [id number] DOB: Sep 22, 1965  06/12/2019  Rachel Golden was observed post Covid-19 immunization for 15 minutes without incident. She was provided with Vaccine Information Sheet and instruction to access the V-Safe system.   Rachel Golden was instructed to call 911 with any severe reactions post vaccine: Marland Kitchen Difficulty breathing  . Swelling of face and throat  . A fast heartbeat  . A bad rash all over body  . Dizziness and weakness   Immunizations Administered    Name Date Dose VIS Date Route   Pfizer COVID-19 Vaccine 06/12/2019  9:37 AM 0.3 mL 03/17/2019 Intramuscular   Manufacturer: [employer], [employer]   Lot: [id number]   NDC: [address]-7

## 2019-06-16 ENCOUNTER — Ambulatory Visit: Payer: Self-pay | Admitting: Neurology

## 2019-06-23 ENCOUNTER — Encounter: Payer: Self-pay | Admitting: Family Medicine

## 2019-06-23 ENCOUNTER — Other Ambulatory Visit: Payer: Self-pay

## 2019-06-23 ENCOUNTER — Ambulatory Visit (INDEPENDENT_AMBULATORY_CARE_PROVIDER_SITE_OTHER): Payer: Managed Care, Other (non HMO) | Admitting: Family Medicine

## 2019-06-23 ENCOUNTER — Telehealth: Payer: Self-pay | Admitting: Family Medicine

## 2019-06-23 ENCOUNTER — Ambulatory Visit (INDEPENDENT_AMBULATORY_CARE_PROVIDER_SITE_OTHER): Payer: Managed Care, Other (non HMO)

## 2019-06-23 VITALS — BP 110/70 | HR 88 | Ht 60.0 in | Wt 118.0 lb

## 2019-06-23 DIAGNOSIS — M25562 Pain in left knee: Secondary | ICD-10-CM

## 2019-06-23 DIAGNOSIS — S83242S Other tear of medial meniscus, current injury, left knee, sequela: Secondary | ICD-10-CM

## 2019-06-23 DIAGNOSIS — M545 Low back pain, unspecified: Secondary | ICD-10-CM

## 2019-06-23 DIAGNOSIS — M999 Biomechanical lesion, unspecified: Secondary | ICD-10-CM | POA: Diagnosis not present

## 2019-06-23 MED ORDER — MELOXICAM 15 MG PO TABS: 15.0000 mg | ORAL_TABLET | Freq: Every day | ORAL | 0 refills | Status: DC

## 2019-06-23 NOTE — Telephone Encounter (Signed)
Pt has been using Arnicare gel on her knee, not sure if it is helping or not but wonders if it is OK to continue using ( forgot to ask this morning)  OK to leave msg  [phone]

## 2019-06-23 NOTE — Assessment & Plan Note (Signed)
Decision today to treat with OMT was based on Physical Exam  After verbal consent patient was treated with HVLA, ME, FPR techniques in  thoracic, lumbar and sacral areas  Patient tolerated the procedure well with improvement in symptoms  Patient given exercises, stretches and lifestyle modifications  See medications in patient instructions if given  Patient will follow up in 4-8 weeks 

## 2019-06-23 NOTE — Assessment & Plan Note (Signed)
Chronic problem with mild tightness.  Discussed icing regimen and home exercise, discussed which activities to do which wants to avoid.  Discussed increasing activity slowly.  Posture and ergonomics.  Responded well to manipulation.  Follow-up again for 8 weeks

## 2019-06-23 NOTE — Patient Instructions (Addendum)
Injected knee today Ice after activity Meloxicam for 10 days then as needed See me in 4-6 weeks

## 2019-06-23 NOTE — Assessment & Plan Note (Signed)
Patient has what appears to be an acute on chronic meniscal tear noted today.  Patient also has a synovitis of the left knee.  Past medical history significant for fibromuscular dysplasia.  Short course of anti-inflammatory given.  Discussed meloxicam daily for 10 days.  Does have the Duexis if that seems to be better.  Can try compression.  Avoiding twisting motions, follow-up again in 3 to 4 weeks

## 2019-06-23 NOTE — Progress Notes (Signed)
[name] [address] [place] [address] Phone: [phone] Subjective:   Rachel Golden, am serving as a scribe for Dr. Antoine Primas. This visit occurred during the SARS-CoV-2 public health emergency.  Safety protocols were in place, including screening questions prior to the visit, additional usage of staff PPE, and extensive cleaning of exam room while observing appropriate contact time as indicated for disinfecting solutions.   I'm seeing this patient by the request  of:  Juluis Rainier, MD  CC: Knee pain low back pain  BSJ:[id number]   01/25/2019 Patient does have a ganglion cyst.  Did not want aspiration today.  Will consider in the future.  Discussed icing regimen, taping,Avoiding certain extension of the wrist.  Follow-up again in 4 to 8 weeks  Postural instability.  Discussed which activities of doing which wants to avoid.  Patient is to increase activity slowly.  Discussed which activities to avoid.  Discussed ergonomics throughout the day including a computer screen.  Follow-up again in 4 to 8 weeks   Update 06/23/2019 Rachel Golden is a 54 y.o. female coming in with complaint of left knee pain. Patient states that her pain has improved since she made the appointment. Was using Duexis at night for a few days. Using sleeve. Pain over medial aspect of joint. No pain with running. States that her knee "tightens" up. Pain with deep knee bending on medial aspect and into the posterior aspect of knee.   Mild back pain.  Has responded well to manipulation previously.  Known mild degenerative disc disease.  No radicular symptoms.  Tightness though that stops her from some activities recently.        Past Medical History:  Diagnosis Date  . Chronic back pain   . Stroke [facility])    Past Surgical History:  Procedure Laterality Date  . NASAL SEPTUM SURGERY     Social History   Socioeconomic History  . Marital status: Married    Spouse  name: Not on file  . Number of children: Not on file  . Years of education: Not on file  . Highest education level: Not on file  Occupational History  . Not on file  Tobacco Use  . Smoking status: Never Smoker  . Smokeless tobacco: Never Used  Substance and Sexual Activity  . Alcohol use: Yes    Alcohol/week: 0.0 standard drinks    Comment: 3-4 drinks per week, on occasion  . Drug use: No  . Sexual activity: Not on file  Other Topics Concern  . Not on file  Social History Narrative   Lives with husband and 2 children in a 2 story home.     Works as an Airline pilot part time.     Education: BS degree.   Social Determinants of Health   Financial Resource Strain:   . Difficulty of Paying Living Expenses:   Food Insecurity:   . Worried About Programme researcher, broadcasting/film/video in the Last Year:   . Barista in the Last Year:   Transportation Needs:   . Freight forwarder (Medical):   Marland Kitchen Lack of Transportation (Non-Medical):   Physical Activity:   . Days of Exercise per Week:   . Minutes of Exercise per Session:   Stress:   . Feeling of Stress :   Social Connections:   . Frequency of Communication with Friends and Family:   . Frequency of Social Gatherings with Friends and Family:   . Attends  Religious Services:   . Active Member of Clubs or Organizations:   . Attends Banker Meetings:   Marland Kitchen Marital Status:    No Known Allergies Family History  Problem Relation Age of Onset  . Heart murmur Mother        Living, 32  . Alzheimer's disease Father        Living, 26  . Vascular Disease Brother   . Healthy Son   . Healthy Daughter        Current Outpatient Medications (Analgesics):  .  aspirin EC 81 MG tablet, Take 1 tablet (81 mg total) by mouth daily. .  Ibuprofen-Famotidine 800-26.6 MG TABS, Take 1 tablet 3 times daily. .  meloxicam (MOBIC) 15 MG tablet, Take 1 tablet (15 mg total) by mouth daily.   Current Outpatient Medications (Other):  Marland Kitchen   Cholecalciferol (VITAMIN D3) 1000 units CAPS, Take 2,000 Units by mouth. .  Glucosamine Sulfate (SYNOVACIN PO), Glucosamine   Reviewed prior external information including notes and imaging from  primary care provider As well as notes that were available from care everywhere and other healthcare systems.  Past medical history, social, surgical and family history all reviewed in electronic medical record.  No pertanent information unless stated regarding to the chief complaint.   Review of Systems:  No headache, visual changes, nausea, vomiting, diarrhea, constipation, dizziness, abdominal pain, skin rash, fevers, chills, night sweats, weight loss, swollen lymph nodes, body aches, joint swelling, chest pain, shortness of breath, mood changes. POSITIVE muscle aches  Objective  Blood pressure 110/70, pulse 88, height 5' (1.524 m), weight 118 lb (53.5 kg), last menstrual period 06/03/2014, SpO2 99 %.   General: No apparent distress alert and oriented x3 mood and affect normal, dressed appropriately.  HEENT: Pupils equal, extraocular movements intact  Respiratory: Patient's speak in full sentences and does not appear short of breath  Cardiovascular: No lower extremity edema, non tender, no erythema  Skin: Warm dry intact with no signs of infection or rash on extremities or on axial skeleton.  Abdomen: Soft nontender  Neuro: Cranial nerves II through XII are intact, neurovascularly intact in all extremities with 2+ DTRs and 2+ pulses.  Lymph: No lymphadenopathy of posterior or anterior cervical chain or axillae bilaterally.  mild antalgic MSK:  Left knee exam shows the patient does have full range of motion.  Very mild lateral tracking of the patella noted.  Patient does have a positive McMurray's.  Tender to palpation over the medial joint line.  Neurovascularly intact distally.  Low back exam does have some tightness noted in the paraspinal musculature mostly in the thoracolumbar and  lumbosacral areas.  Negative straight leg test.  Mild tightness with Pearlean Brownie on the right side.  5-5 strength of the lower extremities.  Osteopathic findings  T3 extended rotated and side bent right inhaled third rib T5 extended rotated and side bent left L4 flexed rotated and side bent left Sacrum right on right   Limited musculoskeletal ultrasound of patient's left knee interpreted and performed by [name]  Ultrasound of patient's left knee shows that patient does have what appears to be a medial meniscal tear that is an acute on chronic.  Increasing in Doppler flow and hypoechoic changes.  Mild displacement of approximately 10% noted.  Patient does have what appears to be a posterior mild perimeniscal cyst versus early Baker's cyst.  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 The above documentation has been reviewed and is accurate and complete Lyndal Pulley, DO       Note: This dictation was prepared with Dragon dictation along with smaller phrase technology. Any transcriptional errors that result from this process are unintentional.

## 2019-06-23 NOTE — Telephone Encounter (Signed)
Spoke with patient.

## 2019-06-26 ENCOUNTER — Ambulatory Visit: Payer: Managed Care, Other (non HMO) | Admitting: Family Medicine

## 2019-07-03 ENCOUNTER — Telehealth: Payer: Self-pay | Admitting: Family Medicine

## 2019-07-03 NOTE — Telephone Encounter (Signed)
Pt knee seems to be healing, she is wondering if a Patella Knee Strap ( or something like that ) would be beneficial to her.  Call pt at [phone]

## 2019-07-12 ENCOUNTER — Ambulatory Visit: Payer: Managed Care, Other (non HMO) | Attending: Internal Medicine

## 2019-07-12 DIAGNOSIS — Z23 Encounter for immunization: Secondary | ICD-10-CM

## 2019-07-12 NOTE — Progress Notes (Signed)
   Covid-19 Vaccination Clinic  Name:  Jakirah Zaun    MRN: [id number] DOB: 1965/09/21  07/12/2019  Ms. Koo was observed post Covid-19 immunization for 15 minutes without incident. She was provided with Vaccine Information Sheet and instruction to access the V-Safe system.   Ms. Heiner was instructed to call 911 with any severe reactions post vaccine: Marland Kitchen Difficulty breathing  . Swelling of face and throat  . A fast heartbeat  . A bad rash all over body  . Dizziness and weakness   Immunizations Administered    Name Date Dose VIS Date Route   Pfizer COVID-19 Vaccine 07/12/2019 10:28 AM 0.3 mL 03/17/2019 Intramuscular   Manufacturer: [employer], [employer]   Lot: [id number]   NDC: [address]-6     2

## 2019-07-14 ENCOUNTER — Telehealth: Payer: Self-pay | Admitting: Family Medicine

## 2019-07-14 NOTE — Telephone Encounter (Signed)
Patient called stating that she has stopped running/walking for 3 days to see if that would help the pain but so far it has not. She notices the pain most when doing up and down stairs or just walking. She asked if there was anything else she should do?

## 2019-07-15 NOTE — Telephone Encounter (Signed)
We can send in prednisone for 5 days if she wants and see how it goes.

## 2019-07-16 ENCOUNTER — Other Ambulatory Visit: Payer: Self-pay | Admitting: Family Medicine

## 2019-07-17 NOTE — Telephone Encounter (Signed)
Spoke with patient. She has decreased her level of physical activity as well as continued use of Meloxicam and pain has improved. Declines prednisone rx at this time and will call back if her pain returns.

## 2019-07-21 ENCOUNTER — Other Ambulatory Visit: Payer: Self-pay

## 2019-07-21 ENCOUNTER — Ambulatory Visit (INDEPENDENT_AMBULATORY_CARE_PROVIDER_SITE_OTHER): Payer: Managed Care, Other (non HMO) | Admitting: Neurology

## 2019-07-21 ENCOUNTER — Encounter: Payer: Self-pay | Admitting: Neurology

## 2019-07-21 VITALS — BP 122/86 | HR 79 | Resp 20 | Ht 60.0 in | Wt 119.0 lb

## 2019-07-21 DIAGNOSIS — I773 Arterial fibromuscular dysplasia: Secondary | ICD-10-CM | POA: Diagnosis not present

## 2019-07-21 NOTE — Patient Instructions (Signed)
Continue aspirin 81mg  daily  Return to clinic in 1 year

## 2019-07-21 NOTE — Progress Notes (Signed)
Follow-up Visit   Date: 07/21/19    Rachel Golden MRN: [id number] DOB: [birth year]   Interim History: Rachel Golden is a 54 y.o. right-handed Caucasian female with chronic back pain, fibromuscular dysplasia and stroke for follow-up of FMD. The patient was accompanied to the clinic by self.  History of present illness: On May 13th 2016, she was picking up a hanger off the floor and developed acute onset of dizziness.  She initially thought it was because she got up too quickly.  She then heard a "swooshing" sound in her head and developed a piercing headache. Swooshing sound resolved within 5 minutes.  Within 30-minutes, she developed right hand tingling and numbness of the lips.  She called 911 who evaluated her and did not feel that her symptoms were consistent with a stroke, so was not taken to the emergency department.  She also recalls having neck pain especially with neck rotation to the right. On May 19th, she was getting out of the car and developed neck stiffness and heard a swooshing sound in her right ear with associated brief but severe headache.  She also has associated vision disturbance described as "kelidoscope" vision, dizzy, and nauseous. She had difficulty walking out of her car to her home, but was able to make it into her home slowly.  Symptoms lasted about 10-minutes.   She again went to see Dr. [name] on the same day who ordered MRI/A of her head.  Imaging shows small infarcts involving the right cerebellum and medial right parieto-occipital region. Vessel imaging was shows vascular irregularity involving bilateral vertebral arteries with ?right dissection, concerning for fibromuscular dysplasia but I do not appreciate this well on my review.  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  Her stroke work-up has been returning largely normal including echocardiogram, holter monitor, and vasculitis/hypercoagulable labs. She was very happy with her evaluation with Dr. Kathyrn Sheriff who  suggested that only if symptoms worsen or she develops new headache, would cerebral angiogram be indicated.    She was started on aspirin 364m and lipitor 230mdaily.  No personal or family history of stroke.  No personal history of hypertension.  Of note, she has received cervical manipulation for stiffness this month, which alleviated her neck discomfort.  UPDATE 07/21/2019:  She is here for 1-year follow-up and is doing great.  She denies any new neurological symptoms, such as headaches, neck pain, or tingling.  Surveillance imaging from 2020 was reviewed and has been stable. She is planning a family trip to the [place].    Medications:  Current Outpatient Medications on File Prior to Visit  Medication Sig Dispense Refill  . aspirin EC 81 MG tablet Take 1 tablet (81 mg total) by mouth daily.    . Cholecalciferol (VITAMIN D3) 1000 units CAPS Take 2,000 Units by mouth.    . Glucosamine Sulfate (SYNOVACIN PO) Glucosamine    . Ibuprofen-Famotidine 800-26.6 MG TABS Take 1 tablet 3 times daily. (Patient not taking: Reported on 07/21/2019) 270 tablet 1  . meloxicam (MOBIC) 15 MG tablet Take 1 tablet (15 mg total) by mouth daily. (Patient not taking: Reported on 07/21/2019) 30 tablet 0   No current facility-administered medications on file prior to visit.    Allergies: No Known Allergies  Review of Systems:  CONSTITUTIONAL: No fevers, chills, night sweats, or weight loss.  EYES: No visual changes or eye pain ENT: No hearing changes.  No history of nose bleeds.   RESPIRATORY: No cough, wheezing  and shortness of breath.   CARDIOVASCULAR: Negative for chest pain, and palpitations.   GI: Negative for abdominal discomfort, blood in stools or black stools.  No recent change in bowel habits.   GU:  No history of incontinence.   MUSCLOSKELETAL: No history of joint pain or swelling.  No myalgias.   SKIN: Negative for lesions, rash, and itching.   ENDOCRINE: Negative for cold or heat  intolerance, polydipsia or goiter.   PSYCH:  No depression or anxiety symptoms.   NEURO: As Above.   Vital Signs:  BP 122/86   Pulse 79   Resp 20   Ht 5' (1.524 m)   Wt 119 lb (54 kg)   LMP 06/03/2014 (Approximate)   SpO2 96%   BMI 23.24 kg/m   Neurological Exam: MENTAL STATUS including orientation to time, place, person, recent and remote memory, attention span and concentration, language, and fund of knowledge is normal.  Speech is not dysarthric.  CRANIAL NERVES: Pupils equal round and reactive to light. No Horner's syndrome.  Normal conjugate, extra-ocular eye movements in all directions of gaze.  No ptosis. Normal facial sensation.  Face is symmetric. Palate elevates symmetrically.  Tongue is midline.  MOTOR:  Motor strength is 5/5 in all extremities.  Tone is normal.  No pronator drift.  MSRs:  Reflexes are brisk and symmetric 3+/4 throughout.  SENSORY:  Intact to vibration.  COORDINATION/GAIT:    Gait narrow based and stable.  Data: Labs 08/23/2014:  CRP <0.1, ESR 12, ANA neg, HbA1c 5.5, LDL 55, HDL 76, C3/C4 normal, ENA neg, ANA neg, RF neg, hypercoagulable panel negative (normal protein C and S activity, no Factor V leiden mutation, normal antithrombin III and anticardiolipin antibody)  Surface echocardiogram 08/31/2014:  normal, EF 60% no thrombus  08/30/2014  48-hr holter:   No significant arrhythmias.  No atrial fib or etiology for an embolic neuro event.  Rare atrial and ventricular ectopy noted.  Predominant rhythm is sinus.     MRI brain 08/27/2014:  Subacute nonhemorrhagic small infarcts involving mid to inferior right cerebellum and medial aspect of the right parietal -occipital lobe.  No intracranial hemorrhage.  MRA head 08/27/2014:  Ectatic cavernous segment internal carotid artery bilaterally. Mild narrowing petrous segment of the internal carotid artery bilaterally. Middle cerebral mild to moderate branch vessel narrowing and irregularity bilaterally greater on  the right.   1.6 x 1.6 mm bulge M1 segment right middle cerebral artery appears to be origin of a vessel rather than saccular aneurysm. Stability can be confirmed on follow-up.   Moderate narrowing at the junction of the A1 segment and A2 segment of the right anterior cerebral artery.  Ectatic vertebral arteries and basilar artery without high-grade stenosis.  Full extent of the left posterior inferior cerebellar artery not imaged on present exam. This is noted on the contrast-enhanced MR angiogram of the neck and appears ectatic were proximal narrowing.  Nonvisualized left anterior inferior cerebellar artery.  Narrowing and irregularity superior cerebellar artery greater on the left.  Posterior cerebral artery branch vessel narrowing and irregularity greater on the left.   MRA NECK FINDINGS Ectatic pleated dilated appearance of the vertebral arteries bilaterally suggestive of fibromuscular dysplasia. With the patient's subacute right sided infarcts, subtle focal dissection with subsequent embolic thrombus cannot be entirely excluded although not detected on the current exam   No significant stenosis of either carotid bifurcation. Ectatic internal carotid artery cervical segment bilaterally may also reflect changes of fibromuscular dysplasia but without pleated appearance as  noted involving the vertebral arteries.   MRA head 05/03/2015: 1. 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 No associated stenosis. The bilateral V4 segments appear spared. 2. Bilateral ICA dolichoectasia, without the cervical ICA beaded appearance typical of carotid FMD. No carotid stenosis. 3.  Negative intracranial MRA aside from ICA siphon tortuosity.  MRA head and neck 06/20/2018 performed at [facility]: 1.  Multifocal irregularity of the V2 and V3  segments of both vertebral arteries, which may relate to fibromuscular dysplasia.  No evidence of significant stenosis or occlusion.  Prior outside imaging 127/2017 is not available for direct comparison to assess for progression of these findings. 2.  Extensive tortuosity of the cervical and cavernous bilateral internal carotid arteries, which is nonspecific but can be seen with longstanding hypertension. 3.  No evidence of significant proximal artery narrowing in the head or neck.   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  Clinically, she does not have any residual neurological symptoms and has been doing great.  Surveillance imaging from 2020 continues to show multifocal iliac irregularity consistent with fibromuscular dysplasia, no progressive changes.  I will start to reduce the frequency of her imaging since she has been stable and remains asymptomatic.  Recheck MRA head and neck in 2022 Continue aspirin 46m daily  Return to clinic in 1 year.    Thank you for allowing me to participate in patient's care.  If I can answer any additional questions, I would be pleased to do so.    Sincerely,    Epsie Walthall K. PPosey Pronto DO

## 2019-08-02 ENCOUNTER — Ambulatory Visit: Payer: Managed Care, Other (non HMO) | Admitting: Family Medicine

## 2019-08-17 ENCOUNTER — Ambulatory Visit: Payer: Self-pay

## 2019-08-17 ENCOUNTER — Other Ambulatory Visit: Payer: Self-pay

## 2019-08-17 ENCOUNTER — Encounter: Payer: Self-pay | Admitting: Family Medicine

## 2019-08-17 ENCOUNTER — Ambulatory Visit: Payer: Managed Care, Other (non HMO) | Admitting: Family Medicine

## 2019-08-17 VITALS — BP 90/72 | HR 65 | Ht 60.0 in | Wt 117.0 lb

## 2019-08-17 DIAGNOSIS — M999 Biomechanical lesion, unspecified: Secondary | ICD-10-CM

## 2019-08-17 DIAGNOSIS — M25562 Pain in left knee: Secondary | ICD-10-CM | POA: Diagnosis not present

## 2019-08-17 DIAGNOSIS — M545 Low back pain, unspecified: Secondary | ICD-10-CM

## 2019-08-17 DIAGNOSIS — G8929 Other chronic pain: Secondary | ICD-10-CM | POA: Diagnosis not present

## 2019-08-17 DIAGNOSIS — S83242S Other tear of medial meniscus, current injury, left knee, sequela: Secondary | ICD-10-CM | POA: Diagnosis not present

## 2019-08-17 NOTE — Assessment & Plan Note (Signed)

## 2019-08-17 NOTE — Progress Notes (Signed)
[place] [address] [place] Phone: [phone] Subjective:   I Rachel Golden am serving as a Education administrator for Dr. Hulan Saas.  This visit occurred during the SARS-CoV-2 public health emergency.  Safety protocols were in place, including screening questions prior to the visit, additional usage of staff PPE, and extensive cleaning of exam room while observing appropriate contact time as indicated for disinfecting solutions.   I'm seeing this patient by the request  of:  Leighton Ruff, MD  CC: Knee pain, back pain follow-up  [id number]   06/23/2019 Patient has what appears to be an acute on chronic meniscal tear noted today.  Patient also has a synovitis of the left knee.  Past medical history significant for fibromuscular dysplasia.  Short course of anti-inflammatory given.  Discussed meloxicam daily for 10 days.  Does have the Duexis if that seems to be better.  Can try compression.  Avoiding twisting motions, follow-up again in 3 to 4 weeks  Chronic problem with mild tightness.  Discussed icing regimen and home exercise, discussed which activities to do which wants to avoid.  Discussed increasing activity slowly.  Posture and ergonomics.  Responded well to manipulation.  Follow-up again for 8 weeks  Update 08/17/2019 Rachel Golden is a 54 y.o. female coming in with complaint of left knee pain and back pain. Patient states she is doing well. Knee has made some progress. Back is still somewhat painful.  Patient was found to have a meniscal tear.  States that she is having minimal pain at the moment.  No locking catching or giving out on her. Back exam mild tightness.  Feels it is secondary to her not being as active secondary to the knee.     Past Medical History:  Diagnosis Date  . Chronic back pain   . Stroke [facility])    Past Surgical History:  Procedure Laterality Date  . NASAL SEPTUM SURGERY     Social History   Socioeconomic  History  . Marital status: Married    Spouse name: Not on file  . Number of children: 2  . Years of education: 84  . Highest education level: Not on file  Occupational History  . Not on file  Tobacco Use  . Smoking status: Never Smoker  . Smokeless tobacco: Never Used  Substance and Sexual Activity  . Alcohol use: Yes    Alcohol/week: 0.0 standard drinks    Comment: 3-4 drinks per week, on occasion  . Drug use: No  . Sexual activity: Not on file  Other Topics Concern  . Not on file  Social History Narrative   Lives with husband and 2 children in a 2 story home.     Works as an Optometrist part time.     Education: BS degree.   Right handed   Social Determinants of Health   Financial Resource Strain:   . Difficulty of Paying Living Expenses:   Food Insecurity:   . Worried About Charity fundraiser in the Last Year:   . Arboriculturist in the Last Year:   Transportation Needs:   . Film/video editor (Medical):   Marland Kitchen Lack of Transportation (Non-Medical):   Physical Activity:   . Days of Exercise per Week:   . Minutes of Exercise per Session:   Stress:   . Feeling of Stress :   Social Connections:   . Frequency of Communication with Friends and Family:   . Frequency  of Social Gatherings with Friends and Family:   . Attends Religious Services:   . Active Member of Clubs or Organizations:   . Attends Banker Meetings:   Marland Kitchen Marital Status:    No Known Allergies Family History  Problem Relation Age of Onset  . Heart murmur Mother        Living, 55  . Alzheimer's disease Father        Living, 90  . Vascular Disease Brother   . Healthy Son   . Healthy Daughter        Current Outpatient Medications (Analgesics):  .  aspirin EC 81 MG tablet, Take 1 tablet (81 mg total) by mouth daily. .  Ibuprofen-Famotidine 800-26.6 MG TABS, Take 1 tablet 3 times daily. .  meloxicam (MOBIC) 15 MG tablet, Take 1 tablet (15 mg total) by mouth daily.   Current  Outpatient Medications (Other):  Marland Kitchen  Cholecalciferol (VITAMIN D3) 1000 units CAPS, Take 2,000 Units by mouth. .  Glucosamine Sulfate (SYNOVACIN PO), Glucosamine   Reviewed prior external information including notes and imaging from  primary care provider As well as notes that were available from care everywhere and other healthcare systems.  Past medical history, social, surgical and family history all reviewed in electronic medical record.  No pertanent information unless stated regarding to the chief complaint.   Review of Systems:  No headache, visual changes, nausea, vomiting, diarrhea, constipation, dizziness, abdominal pain, skin rash, fevers, chills, night sweats, weight loss, swollen lymph nodes, body aches, joint swelling, chest pain, shortness of breath, mood changes. POSITIVE muscle aches  Objective  Blood pressure 90/72, pulse 65, height 5' (1.524 m), weight 117 lb (53.1 kg), last menstrual period 06/03/2014, SpO2 96 %.   General: No apparent distress alert and oriented x3 mood and affect normal, dressed appropriately.  HEENT: Pupils equal, extraocular movements intact  Respiratory: Patient's speak in full sentences and does not appear short of breath  Cardiovascular: No lower extremity edema, non tender, no erythema  Neuro: Cranial nerves II through XII are intact, neurovascularly intact in all extremities with 2+ DTRs and 2+ pulses.  Gait normal with good balance and coordination.  MSK:  Non tender with full range of motion and good stability and symmetric strength and tone of shoulders, elbows, wrist, hip and ankles bilaterally.  Knee exam shows the patient does have some tenderness to palpation in the medial joint space.  Negative McMurray's .  Full range of motion. Back exam tightness noted mostly over the right sacroiliac joint.  Mild positive Pearlean Brownie.  Negative straight leg test.  Mild increasing discomfort with extension greater than 10 degrees of the back.  Limited  musculoskeletal ultrasound was performed and interpreted by [name]  Limited ultrasound of patient's knee shows the patient does have the chronic meniscal tears of the medial and lateral actually.  Synovitis has since resolved since previous ultrasound.  Otherwise fairly unremarkable  Osteopathic findings  T7 extended rotated and side bent right L2 flexed rotated and side bent right Sacrum right on right    Impression and Recommendations:     This case required medical decision making of moderate complexity. The above documentation has been reviewed and is accurate and complete [name], DO       Note: This dictation was prepared with Dragon dictation along with smaller phrase technology. Any transcriptional errors that result from this process are unintentional.

## 2019-08-17 NOTE — Patient Instructions (Signed)
Knee is looking good Brace with any trail running Back ok for massage in 2 weeks See me again in 4 weeks before you leave

## 2019-08-17 NOTE — Assessment & Plan Note (Signed)
Appears to be doing fairly well.  Still about 5% displacement noted.  Significant decreasing hypoechoic changes and no more synovitis noted on ultrasound.  Discussed with patient okay to run.  Discussed wearing a brace though with any type of instability though.  Follow-up again 4 to 8 weeks

## 2019-08-17 NOTE — Assessment & Plan Note (Signed)
Chronic problem with exacerbation.  Likely secondary to more focusing on the knee at the moment.  We discussed which activities to do which wants to avoid.  Patient responded fairly well to osteopathic manipulation.  Continue the ibuprofen or the meloxicam but warned not to do both.  Follow-up again in 4 to 8 weeks

## 2019-09-15 ENCOUNTER — Ambulatory Visit: Payer: Managed Care, Other (non HMO) | Admitting: Family Medicine

## 2019-09-15 ENCOUNTER — Other Ambulatory Visit: Payer: Self-pay

## 2019-09-15 VITALS — BP 112/80 | HR 66 | Ht 60.0 in | Wt 117.0 lb

## 2019-09-15 DIAGNOSIS — M545 Low back pain, unspecified: Secondary | ICD-10-CM

## 2019-09-15 DIAGNOSIS — M999 Biomechanical lesion, unspecified: Secondary | ICD-10-CM | POA: Diagnosis not present

## 2019-09-15 NOTE — Progress Notes (Signed)
[place] Phone: [phone] Subjective:    I'm seeing this patient by the request  of:  Leighton Ruff, MD  CC: Back pain follow-up  [id number]   08/17/2019 Chronic problem with exacerbation.  Likely secondary to more focusing on the knee at the moment.  We discussed which activities to do which wants to avoid.  Patient responded fairly well to osteopathic manipulation.  Continue the ibuprofen or the meloxicam but warned not to do both.  Follow-up again in 4 to 8 weeks  Appears to be doing fairly well.  Still about 5% displacement noted.  Significant decreasing hypoechoic changes and no more synovitis noted on ultrasound.  Discussed with patient okay to run.  Discussed wearing a brace though with any type of instability though.  Follow-up again 4 to 8 weeks  Update 09/15/2019 Rachel Golden is a 54 y.o. female coming in with complaint of left knee pain and back pain. Patient states that her knee pain has diminished. Patient had a massage last night. Patient her for OMT.  Patient is going to be traveling.  No she will be doing the exercises quite as regularly.       Past Medical History:  Diagnosis Date  . Chronic back pain   . Stroke [facility])    Past Surgical History:  Procedure Laterality Date  . NASAL SEPTUM SURGERY     Social History   Socioeconomic History  . Marital status: Married    Spouse name: Not on file  . Number of children: 2  . Years of education: 11  . Highest education level: Not on file  Occupational History  . Not on file  Tobacco Use  . Smoking status: Never Smoker  . Smokeless tobacco: Never Used  Vaping Use  . Vaping Use: Never used  Substance and Sexual Activity  . Alcohol use: Yes    Alcohol/week: 0.0 standard drinks    Comment: 3-4 drinks per week, on occasion  . Drug use: No  . Sexual activity: Not on file  Other Topics Concern  . Not on file  Social History  Narrative   Lives with husband and 2 children in a 2 story home.     Works as an Optometrist part time.     Education: BS degree.   Right handed   Social Determinants of Health   Financial Resource Strain:   . Difficulty of Paying Living Expenses:   Food Insecurity:   . Worried About Charity fundraiser in the Last Year:   . Arboriculturist in the Last Year:   Transportation Needs:   . Film/video editor (Medical):   Marland Kitchen Lack of Transportation (Non-Medical):   Physical Activity:   . Days of Exercise per Week:   . Minutes of Exercise per Session:   Stress:   . Feeling of Stress :   Social Connections:   . Frequency of Communication with Friends and Family:   . Frequency of Social Gatherings with Friends and Family:   . Attends Religious Services:   . Active Member of Clubs or Organizations:   . Attends Archivist Meetings:   Marland Kitchen Marital Status:    No Known Allergies Family History  Problem Relation Age of Onset  . Heart murmur Mother        Living, 60  . Alzheimer's disease Father        Living, 53  . Vascular Disease Brother   .  Healthy Son   . Healthy Daughter        Current Outpatient Medications (Analgesics):  .  aspirin EC 81 MG tablet, Take 1 tablet (81 mg total) by mouth daily. .  Ibuprofen-Famotidine 800-26.6 MG TABS, Take 1 tablet 3 times daily. .  meloxicam (MOBIC) 15 MG tablet, Take 1 tablet (15 mg total) by mouth daily.   Current Outpatient Medications (Other):  Marland Kitchen  Cholecalciferol (VITAMIN D3) 1000 units CAPS, Take 2,000 Units by mouth. .  Glucosamine Sulfate (SYNOVACIN PO), Glucosamine   Reviewed prior external information including notes and imaging from  primary care provider As well as notes that were available from care everywhere and other healthcare systems.  Past medical history, social, surgical and family history all reviewed in electronic medical record.  No pertanent information unless stated regarding to the chief complaint.     Review of Systems:  No headache, visual changes, nausea, vomiting, diarrhea, constipation, dizziness, abdominal pain, skin rash, fevers, chills, night sweats, weight loss, swollen lymph nodes, body aches, joint swelling, chest pain, shortness of breath, mood changes. POSITIVE muscle aches  Objective  Blood pressure 112/80, pulse 66, height 5' (1.524 m), weight 117 lb (53.1 kg), last menstrual period 06/03/2014, SpO2 97 %.   General: No apparent distress alert and oriented x3 mood and affect normal, dressed appropriately.  HEENT: Pupils equal, extraocular movements intact  Respiratory: Patient's speak in full sentences and does not appear short of breath  Cardiovascular: No lower extremity edema, non tender, no erythema  Neuro: Cranial nerves II through XII are intact, neurovascularly intact in all extremities with 2+ DTRs and 2+ pulses.  Gait normal with good balance and coordination.  MSK: Back exam does have some mild loss of lordosis.  He is does have some tightness in the musculature of the hips bilaterally.  Negative straight leg test.  Osteopathic findings T9 extended rotated and side bent left L2 flexed rotated and side bent right Sacrum right on right     Impression and Recommendations:     The above documentation has been reviewed and is accurate and complete [name], DO       Note: This dictation was prepared with Dragon dictation along with smaller phrase technology. Any transcriptional errors that result from this process are unintentional.

## 2019-09-15 NOTE — Patient Instructions (Signed)
Scapular stability exercises  Stay active Have a great trip  See me again in 6-8 weeks

## 2019-09-16 ENCOUNTER — Encounter: Payer: Self-pay | Admitting: Family Medicine

## 2019-09-16 NOTE — Assessment & Plan Note (Signed)

## 2019-09-16 NOTE — Assessment & Plan Note (Signed)
Seems to be muscular at this time.  More of some mild muscle imbalances.  Discussed about the tightness of the hip flexors.  Discussed medication management including the meloxicam.  Patient will continue with the conservative therapy at this time and will follow up with me in 6 weeks

## 2019-11-10 ENCOUNTER — Ambulatory Visit: Payer: Managed Care, Other (non HMO) | Admitting: Family Medicine

## 2020-01-15 ENCOUNTER — Other Ambulatory Visit: Payer: Self-pay

## 2020-01-15 ENCOUNTER — Ambulatory Visit: Payer: Managed Care, Other (non HMO) | Admitting: Family Medicine

## 2020-01-15 ENCOUNTER — Encounter: Payer: Self-pay | Admitting: Family Medicine

## 2020-01-15 ENCOUNTER — Ambulatory Visit: Payer: Self-pay

## 2020-01-15 VITALS — BP 108/78 | HR 76 | Ht 60.0 in | Wt 118.0 lb

## 2020-01-15 DIAGNOSIS — M545 Low back pain, unspecified: Secondary | ICD-10-CM | POA: Diagnosis not present

## 2020-01-15 DIAGNOSIS — M79662 Pain in left lower leg: Secondary | ICD-10-CM

## 2020-01-15 DIAGNOSIS — M79661 Pain in right lower leg: Secondary | ICD-10-CM | POA: Diagnosis not present

## 2020-01-15 DIAGNOSIS — M999 Biomechanical lesion, unspecified: Secondary | ICD-10-CM

## 2020-01-15 MED ORDER — IBUPROFEN-FAMOTIDINE 800-26.6 MG PO TABS: ORAL_TABLET | ORAL | 1 refills | Status: DC

## 2020-01-15 NOTE — Patient Instructions (Signed)
You are awesome congrats on PTI! Keep doing what you are doing Chirp and massage See me again in 7-8 weeks

## 2020-01-15 NOTE — Assessment & Plan Note (Signed)
More secondary to tightness noted in the paraspinal musculature of the lumbar spine right greater than left.  The patient does not have any true straight leg test today.  Patient responds well though to the manipulation at the moment.  Encourage her to continue to run.  Refill of the Duexis given.  Discussed with her Duexis or the meloxicam not both.  Follow-up with me again in 6 to 8 weeks

## 2020-01-15 NOTE — Progress Notes (Signed)
[name] [address] [place] [address] Phone: [phone] Subjective:   Rachel Golden, am serving as a scribe for Dr. Antoine Primas. This visit occurred during the SARS-CoV-2 public health emergency.  Safety protocols were in place, including screening questions prior to the visit, additional usage of staff PPE, and extensive cleaning of exam room while observing appropriate contact time as indicated for disinfecting solutions.   I'm seeing this patient by the request  of:  Juluis Rainier, MD  CC: Low back pain follow-up  [id number]  Rachel Golden is a 54 y.o. female coming in with complaint of bilateral calf pain. Last seen on 09/15/2019 for OMT. Patient states has been running a little bit more recently.  Patient has been doing relatively well.  Feeling very healthy.  Patient has had more tightness of the lower back Denies any radiation.    Past Medical History:  Diagnosis Date  . Chronic back pain   . Stroke [facility])    Past Surgical History:  Procedure Laterality Date  . NASAL SEPTUM SURGERY     Social History   Socioeconomic History  . Marital status: Married    Spouse name: Not on file  . Number of children: 2  . Years of education: 62  . Highest education level: Not on file  Occupational History  . Not on file  Tobacco Use  . Smoking status: Never Smoker  . Smokeless tobacco: Never Used  Vaping Use  . Vaping Use: Never used  Substance and Sexual Activity  . Alcohol use: Yes    Alcohol/week: 0.0 standard drinks    Comment: 3-4 drinks per week, on occasion  . Drug use: No  . Sexual activity: Not on file  Other Topics Concern  . Not on file  Social History Narrative   Lives with husband and 2 children in a 2 story home.     Works as an Airline pilot part time.     Education: BS degree.   Right handed   Social Determinants of Health   Financial Resource Strain:   . Difficulty of Paying Living Expenses: Not on file   Food Insecurity:   . Worried About Programme researcher, broadcasting/film/video in the Last Year: Not on file  . Ran Out of Food in the Last Year: Not on file  Transportation Needs:   . Lack of Transportation (Medical): Not on file  . Lack of Transportation (Non-Medical): Not on file  Physical Activity:   . Days of Exercise per Week: Not on file  . Minutes of Exercise per Session: Not on file  Stress:   . Feeling of Stress : Not on file  Social Connections:   . Frequency of Communication with Friends and Family: Not on file  . Frequency of Social Gatherings with Friends and Family: Not on file  . Attends Religious Services: Not on file  . Active Member of Clubs or Organizations: Not on file  . Attends Banker Meetings: Not on file  . Marital Status: Not on file   No Known Allergies Family History  Problem Relation Age of Onset  . Heart murmur Mother        Living, 90  . Alzheimer's disease Father        Living, 46  . Vascular Disease Brother   . Healthy Son   . Healthy Daughter        Current Outpatient Medications (Analgesics):  .  aspirin EC 81 MG tablet,  Take 1 tablet (81 mg total) by mouth daily. .  Ibuprofen-Famotidine 800-26.6 MG TABS, Take 1 tablet 3 times daily. .  meloxicam (MOBIC) 15 MG tablet, Take 1 tablet (15 mg total) by mouth daily.   Current Outpatient Medications (Other):  Marland Kitchen  Cholecalciferol (VITAMIN D3) 1000 units CAPS, Take 2,000 Units by mouth. .  Glucosamine Sulfate (SYNOVACIN PO), Glucosamine   Reviewed prior external information including notes and imaging from  primary care provider As well as notes that were available from care everywhere and other healthcare systems.  Past medical history, social, surgical and family history all reviewed in electronic medical record.  No pertanent information unless stated regarding to the chief complaint.   Review of Systems:  No headache, visual changes, nausea, vomiting, diarrhea, constipation, dizziness,  abdominal pain, skin rash, fevers, chills, night sweats, weight loss, swollen lymph nodes, body aches, joint swelling, chest pain, shortness of breath, mood changes. POSITIVE muscle aches  Objective  Blood pressure 108/78, pulse 76, height 5' (1.524 m), weight 118 lb (53.5 kg), last menstrual period 06/03/2014, SpO2 98 %.   General: No apparent distress alert and oriented x3 mood and affect normal, dressed appropriately.  HEENT: Pupils equal, extraocular movements intact  Respiratory: Patient's speak in full sentences and does not appear short of breath  Cardiovascular: No lower extremity edema, non tender, no erythema  Neuro: Cranial nerves II through XII are intact, neurovascularly intact in all extremities with 2+ DTRs and 2+ pulses.  Gait normal with good balance and coordination.  MSK: Patient back exam does show some mild loss of lordosis.  No significant arthritic changes of the joints.  Tightness noted in the [place] right greater than left as well.   Osteopathic findings  T3 extended rotated and side bent right  T8 extended rotated and side bent left L4 flexed rotated and side bent left  Sacrum right on right    Impression and Recommendations:     The above documentation has been reviewed and is accurate and complete [name], DO

## 2020-02-06 ENCOUNTER — Other Ambulatory Visit: Payer: Self-pay

## 2020-02-06 ENCOUNTER — Ambulatory Visit: Payer: Managed Care, Other (non HMO) | Admitting: Family Medicine

## 2020-02-06 ENCOUNTER — Encounter: Payer: Self-pay | Admitting: Family Medicine

## 2020-02-06 ENCOUNTER — Ambulatory Visit (INDEPENDENT_AMBULATORY_CARE_PROVIDER_SITE_OTHER): Payer: Managed Care, Other (non HMO)

## 2020-02-06 ENCOUNTER — Ambulatory Visit: Payer: Self-pay

## 2020-02-06 VITALS — BP 110/78 | HR 71 | Ht 60.0 in | Wt 121.0 lb

## 2020-02-06 DIAGNOSIS — G8929 Other chronic pain: Secondary | ICD-10-CM

## 2020-02-06 DIAGNOSIS — M25562 Pain in left knee: Secondary | ICD-10-CM | POA: Diagnosis not present

## 2020-02-06 DIAGNOSIS — S83242S Other tear of medial meniscus, current injury, left knee, sequela: Secondary | ICD-10-CM

## 2020-02-06 NOTE — Progress Notes (Signed)
[name] [address] [place] [address] Phone: [phone] Subjective:   I Ronelle Nigh am serving as a Neurosurgeon for Dr. Antoine Primas.  This visit occurred during the SARS-CoV-2 public health emergency.  Safety protocols were in place, including screening questions prior to the visit, additional usage of staff PPE, and extensive cleaning of exam room while observing appropriate contact time as indicated for disinfecting solutions.   I'm seeing this patient by the request  of:  Juluis Rainier, MD  CC: Left knee pain  [id number]  Rachel Golden is a 54 y.o. female coming in with complaint of knee pain. Last seen on 01/15/2020 for OMT and calf pain. Patient states she fell about 10 days ago. Medial pain in the left knee. TTP. Has tried arnica.  Patient is having more of the knee pain at this moment.  Patient describes it as a dull, throbbing aching pain.  Has had a meniscal tear states that this seems to be a little different.  Unable to run with it.  Has been swimming without any significant discomfort though.       Past Medical History:  Diagnosis Date  . Chronic back pain   . Stroke [facility])    Past Surgical History:  Procedure Laterality Date  . NASAL SEPTUM SURGERY     Social History   Socioeconomic History  . Marital status: Married    Spouse name: Not on file  . Number of children: 2  . Years of education: 74  . Highest education level: Not on file  Occupational History  . Not on file  Tobacco Use  . Smoking status: Never Smoker  . Smokeless tobacco: Never Used  Vaping Use  . Vaping Use: Never used  Substance and Sexual Activity  . Alcohol use: Yes    Alcohol/week: 0.0 standard drinks    Comment: 3-4 drinks per week, on occasion  . Drug use: No  . Sexual activity: Not on file  Other Topics Concern  . Not on file  Social History Narrative   Lives with husband and 2 children in a 2 story home.     Works as an  Airline pilot part time.     Education: BS degree.   Right handed   Social Determinants of Health   Financial Resource Strain:   . Difficulty of Paying Living Expenses: Not on file  Food Insecurity:   . Worried About Programme researcher, broadcasting/film/video in the Last Year: Not on file  . Ran Out of Food in the Last Year: Not on file  Transportation Needs:   . Lack of Transportation (Medical): Not on file  . Lack of Transportation (Non-Medical): Not on file  Physical Activity:   . Days of Exercise per Week: Not on file  . Minutes of Exercise per Session: Not on file  Stress:   . Feeling of Stress : Not on file  Social Connections:   . Frequency of Communication with Friends and Family: Not on file  . Frequency of Social Gatherings with Friends and Family: Not on file  . Attends Religious Services: Not on file  . Active Member of Clubs or Organizations: Not on file  . Attends Banker Meetings: Not on file  . Marital Status: Not on file   No Known Allergies Family History  Problem Relation Age of Onset  . Heart murmur Mother        Living, 28  . Alzheimer's disease Father  Living, 80  . Vascular Disease Brother   . Healthy Son   . Healthy Daughter        Current Outpatient Medications (Analgesics):  .  aspirin EC 81 MG tablet, Take 1 tablet (81 mg total) by mouth daily. .  Ibuprofen-Famotidine 800-26.6 MG TABS, Take 1 tablet 3 times daily. .  meloxicam (MOBIC) 15 MG tablet, Take 1 tablet (15 mg total) by mouth daily.   Current Outpatient Medications (Other):  Marland Kitchen  Cholecalciferol (VITAMIN D3) 1000 units CAPS, Take 2,000 Units by mouth. .  Glucosamine Sulfate (SYNOVACIN PO), Glucosamine   Reviewed prior external information including notes and imaging from  primary care provider As well as notes that were available from care everywhere and other healthcare systems.  Past medical history, social, surgical and family history all reviewed in electronic medical record.  No  pertanent information unless stated regarding to the chief complaint.   Review of Systems:  No headache, visual changes, nausea, vomiting, diarrhea, constipation, dizziness, abdominal pain, skin rash, fevers, chills, night sweats, weight loss, swollen lymph nodes, body aches, joint swelling, chest pain, shortness of breath, mood changes. POSITIVE muscle aches  Objective  Blood pressure 110/78, pulse 71, height 5' (1.524 m), weight 121 lb (54.9 kg), last menstrual period 06/03/2014, SpO2 96 %.   General: No apparent distress alert and oriented x3 mood and affect normal, dressed appropriately.  HEENT: Pupils equal, extraocular movements intact  Respiratory: Patient's speak in full sentences and does not appear short of breath  Cardiovascular: No lower extremity edema, non tender, no erythema  Gait mild antalgic MSK: Left knee shows severe tenderness to palpation of the medial joint space.  Mild positive McMurray's.  Patient does not have pain no with stressing of the MCL but potential laxity compared to the contralateral side.  More pain with full extension of the knee and less pain with flexion.  Neurovascularly intact distally.  No significant effusion noted.  Limited musculoskeletal ultrasound was performed and interpreted by [name]  Limited ultrasound of patient's left knee shows that patient does have a medial meniscal tear that appears to be acute on chronic.  No displacement noted.  Increasing Doppler flow in the area as well.  Patient does have an abnormality with some mild hypoechoic changes on the superficial aspect of the MCL noted.      Impression and Recommendations:     The above documentation has been reviewed and is accurate and complete [name], DO

## 2020-02-06 NOTE — Assessment & Plan Note (Signed)
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  X-rays ordered today to further evaluate.  Discussed with patient about the Duexis intermittently.  Do not take it with the meloxicam.  X-rays ordered today secondary to the amount of displacement.  Hinged brace given today but I think will be more beneficial.  There is potential concern for the MCL as well.  Follow-up with me again 4 weeks.

## 2020-02-06 NOTE — Patient Instructions (Signed)
Good to see you New xray of the knee today Biking and swimming is best otherwise walk in brace Take duexis when you need it See me again in 4 weeks

## 2020-02-07 ENCOUNTER — Encounter: Payer: Self-pay | Admitting: Family Medicine

## 2020-03-06 ENCOUNTER — Ambulatory Visit: Payer: Managed Care, Other (non HMO) | Admitting: Family Medicine

## 2020-03-11 NOTE — Progress Notes (Signed)
[name] [address] [place] [address] Phone: [phone] Subjective:   Rachel Golden, am serving as a scribe for Dr. Antoine Primas. This visit occurred during the SARS-CoV-2 public health emergency.  Safety protocols were in place, including screening questions prior to the visit, additional usage of staff PPE, and extensive cleaning of exam room while observing appropriate contact time as indicated for disinfecting solutions.   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  X-rays ordered today to further evaluate.  Discussed with patient about the Duexis intermittently.  Do not take it with the meloxicam.  X-rays ordered today secondary to the amount of displacement.  Hinged brace given today but I think will be more beneficial.  There is potential concern for the MCL as well.  Follow-up with me again 4 weeks.   Update 03/11/2020 Rachel Golden is a 54 y.o. female coming in with complaint of left knee pain. Patient states that her calf is good and the brace and sleeve she wears helps a lot. States not a lot of pain more of a sore feeling. Sunday ran a mile for the first time a mile and felt good and then ran a mile this morning and a walk afterward and so far so good. No longer has the sharp pain anymore.     Patient at last exam did have knee x-rays.  These were independently visualized by me showing the patient did have a trace joint effusion mild degenerative changes involving the medial and mild of the patellofemoral compartment.  [place] LIMITED JOINT SPACE STRUCTURES UP LEFT(NO LINKED CHARGES) Limited musculoskeletal ultrasound was performed and interpreted by  [name]  Limited ultrasound of patient's left knee shows that patient does have a  medial meniscal tear  that appears to be acute on chronic.  No displacement  noted.  Increasing Doppler flow in the area as well.  Patient does have an  abnormality with some mild hypoechoic changes on the superficial aspect of  the MCL noted.    Past Medical History:  Diagnosis Date  . Chronic back pain   . Stroke [facility])    Past Surgical History:  Procedure Laterality Date  . NASAL SEPTUM SURGERY     Social History   Socioeconomic History  . Marital status: Married    Spouse name: Not on file  . Number of children: 2  . Years of education: 75  . Highest education level: Not on file  Occupational History  . Not on file  Tobacco Use  . Smoking status: Never Smoker  . Smokeless tobacco: Never Used  Vaping Use  . Vaping Use: Never used  Substance and Sexual Activity  . Alcohol use: Yes    Alcohol/week: 0.0 standard drinks    Comment: 3-4 drinks per week, on occasion  . Drug use: No  . Sexual activity: Not on file  Other Topics Concern  . Not on file  Social History Narrative   Lives with husband and 2 children in a 2 story home.     Works as an Airline pilot part time.     Education: BS degree.   Right handed   Social Determinants of Health   Financial Resource Strain:   . Difficulty of Paying Living Expenses: Not on file  Food Insecurity:   . Worried About Programme researcher, broadcasting/film/video in the Last Year: Not on file  . Ran Out of Food in the Last Year: Not on file  Transportation Needs:   . Lack of Transportation (Medical): Not on file  . Lack of Transportation (Non-Medical): Not on file  Physical Activity:   . Days of Exercise per Week: Not on file  . Minutes of Exercise per Session: Not on file  Stress:   . Feeling of Stress : Not on file  Social Connections:   . Frequency of Communication with Friends and Family: Not on file  . Frequency of Social Gatherings with Friends and Family: Not on file  . Attends Religious Services: Not on file  . Active Member of Clubs or Organizations: Not on  file  . Attends Banker Meetings: Not on file  . Marital Status: Not on file   No Known Allergies Family History  Problem Relation Age of Onset  . Heart murmur Mother        Living, 20  . Alzheimer's disease Father        Living, 36  . Vascular Disease Brother   . Healthy Son   . Healthy Daughter        Current Outpatient Medications (Analgesics):  .  aspirin EC 81 MG tablet, Take 1 tablet (81 mg total) by mouth daily. .  Ibuprofen-Famotidine 800-26.6 MG TABS, Take 1 tablet 3 times daily. .  meloxicam (MOBIC) 15 MG tablet, Take 1 tablet (15 mg total) by mouth daily.   Current Outpatient Medications (Other):  Marland Kitchen  Cholecalciferol (VITAMIN D3) 1000 units CAPS, Take 2,000 Units by mouth. .  Glucosamine Sulfate (SYNOVACIN PO), Glucosamine   Reviewed prior external information including notes and imaging from  primary care provider As well as notes that were available from care everywhere and other healthcare systems.  Past medical history, social, surgical and family history all reviewed in electronic medical record.  No pertanent information unless stated regarding to the chief complaint.   Review of Systems:  No headache, visual changes, nausea, vomiting, diarrhea, constipation, dizziness, abdominal pain, skin rash, fevers, chills, night sweats, weight loss, swollen lymph nodes, body aches, joint swelling, chest pain, shortness of breath, mood changes. POSITIVE muscle aches  Objective  Last menstrual period 06/03/2014.   General: No apparent distress alert and oriented x3 mood and affect normal, dressed appropriately.  HEENT: Pupils equal, extraocular movements intact  Respiratory: Patient's speak in full sentences and does not appear short of breath  Cardiovascular: No lower extremity edema, non tender, no erythema  Gait normal with good balance and coordination.  MSK: Knee: Left Normal to inspection with no erythema or effusion or obvious bony  abnormalities. Palpation normal with no warmth, joint line tenderness, patellar tenderness, or condyle tenderness. ROM full in flexion and extension and lower leg rotation. Ligaments with solid consistent endpoints including ACL, PCL, LCL, MCL. Positive Mcmurray's, Apley's, and [place]ssalonian tests. Non painful patellar compression. Patellar glide with mild crepitus. Patellar and quadriceps tendons unremarkable. Hamstring and quadriceps strength is normal. Low back exam shows the patient does have some mild loss of lordosis.  Tightness noted in the paraspinal musculature of the lumbar spine right greater than left.  Back exam shows the patient does have some mild loss of lordosis.  Tightness noted in the paraspinal musculature.  Tightness with [place] right greater than left.  Mild tightness in the parascapular region right greater than left as well.  Full  range of motion with full strength of the extremities.  Limited musculoskeletal ultrasound was performed and interpreted by [name]  Limited ultrasound of patient's knee shows patient does still have some mild displacement of the medial meniscus.  Patient does have some mild increase in Doppler flow into the surrounding area.  Patient does have mild arthritic changes.  Impression: Interval improvement of the medial meniscus but still present with mild displacement  Osteopathic findings T6 extended rotated and side bent left L1 flexed rotated and side bent right Sacrum right on right    Impression and Recommendations:     The above documentation has been reviewed and is accurate and complete [name], DO

## 2020-03-12 ENCOUNTER — Ambulatory Visit: Payer: Self-pay

## 2020-03-12 ENCOUNTER — Encounter: Payer: Self-pay | Admitting: Family Medicine

## 2020-03-12 ENCOUNTER — Other Ambulatory Visit: Payer: Self-pay

## 2020-03-12 ENCOUNTER — Ambulatory Visit: Payer: Managed Care, Other (non HMO) | Admitting: Family Medicine

## 2020-03-12 VITALS — BP 126/80 | HR 90 | Ht 60.0 in | Wt 120.0 lb

## 2020-03-12 DIAGNOSIS — M25562 Pain in left knee: Secondary | ICD-10-CM

## 2020-03-12 DIAGNOSIS — M999 Biomechanical lesion, unspecified: Secondary | ICD-10-CM | POA: Diagnosis not present

## 2020-03-12 DIAGNOSIS — G8929 Other chronic pain: Secondary | ICD-10-CM | POA: Diagnosis not present

## 2020-03-12 DIAGNOSIS — M545 Low back pain, unspecified: Secondary | ICD-10-CM | POA: Diagnosis not present

## 2020-03-12 DIAGNOSIS — S83242S Other tear of medial meniscus, current injury, left knee, sequela: Secondary | ICD-10-CM

## 2020-03-12 NOTE — Patient Instructions (Addendum)
Good to see you  You are doing great  Still avoid twisitng motions.  If things worsen send a message Ok to increase activity slowly.   See me again in 6-8 weeks

## 2020-03-12 NOTE — Assessment & Plan Note (Signed)
Patient does show some mild displacement still noted on ultrasound but overall is making improvement.  Still mild positive McMurray's on exam today.  Patient does have the anti-inflammatories and given some topical.  Discussed home exercises, discussed still avoiding twisting motions.  Is able to increase activity otherwise.  Follow-up again in 6 to 8 weeks

## 2020-03-12 NOTE — Assessment & Plan Note (Signed)
Low back doing relatively well.  Discussed posture and ergonomics.  Discussed which activities to do which wants to avoid.  Patient is to increase activity slowly.  Discussed staying active.  Patient does like to run and can continue to do so.  Follow-up with me again in 6 to 8 weeks

## 2020-04-23 NOTE — Progress Notes (Signed)
[name] [address] [place] [address] Phone: [phone] Subjective:   Rachel Golden, am serving as a scribe for Dr. Antoine Primas. This visit occurred during the SARS-CoV-2 public health emergency.  Safety protocols were in place, including screening questions prior to the visit, additional usage of staff PPE, and extensive cleaning of exam room while observing appropriate contact time as indicated for disinfecting solutions.   I'm seeing this patient by the request  of:  Juluis Rainier, MD  CC: Back pain, knee pain follow-up  MPN:[id number]   03/12/2020 Low back doing relatively well.  Discussed posture and ergonomics.  Discussed which activities to do which wants to avoid.  Patient is to increase activity slowly.  Discussed staying active.  Patient does like to run and can continue to do so.  Follow-up with me again in 6 to 8 weeks  Patient does show some mild displacement still noted on ultrasound but overall is making improvement.  Still mild positive McMurray's on exam today.  Patient does have the anti-inflammatories and given some topical.  Discussed home exercises, discussed still avoiding twisting motions.  Is able to increase activity otherwise.  Follow-up again in 6 to 8 weeks  Update 04/24/2020 Rachel Golden is a 55 y.o. female coming in with complaint of left knee and low back pain. OMT performed at last visit. Patient states that she has been wearing the brace and wants to know if she needs to wear the brace forever. Patient was found to have a medial meniscal tear but has been improving. Not having any significant pain at this time. Even on New Year's Day did run greater than 15 mi without any significant discomfort.  Does have chronic back pain. Known arthritic changes. He did have an MRI showing degenerative disc disease at multiple levels. Patient though has done very well over the course of the years and continues to stay active.  Patient denies any radiation of pain but does have a soreness noted on a regular basis.       Past Medical History:  Diagnosis Date  . Chronic back pain   . Stroke [facility])    Past Surgical History:  Procedure Laterality Date  . NASAL SEPTUM SURGERY     Social History   Socioeconomic History  . Marital status: Married    Spouse name: Not on file  . Number of children: 2  . Years of education: 58  . Highest education level: Not on file  Occupational History  . Not on file  Tobacco Use  . Smoking status: Never Smoker  . Smokeless tobacco: Never Used  Vaping Use  . Vaping Use: Never used  Substance and Sexual Activity  . Alcohol use: Yes    Alcohol/week: 0.0 standard drinks    Comment: 3-4 drinks per week, on occasion  . Drug use: No  . Sexual activity: Not on file  Other Topics Concern  . Not on file  Social History Narrative   Lives with husband and 2 children in a 2 story home.     Works as an Airline pilot part time.     Education: BS degree.   Right handed   Social Determinants of Health   Financial Resource Strain: Not on file  Food Insecurity: Not on file  Transportation Needs: Not on file  Physical Activity: Not on file  Stress: Not on file  Social Connections: Not on file   No Known Allergies Family History  Problem Relation  Age of Onset  . Heart murmur Mother        Living, 58  . Alzheimer's disease Father        Living, 67  . Vascular Disease Brother   . Healthy Son   . Healthy Daughter        Current Outpatient Medications (Analgesics):  .  aspirin EC 81 MG tablet, Take 1 tablet (81 mg total) by mouth daily. .  Ibuprofen-Famotidine 800-26.6 MG TABS, Take 1 tablet 3 times daily.   Current Outpatient Medications (Other):  Marland Kitchen  Cholecalciferol (VITAMIN D3) 1000 units CAPS, Take 2,000 Units by mouth. .  Glucosamine Sulfate (SYNOVACIN PO), Glucosamine   Reviewed prior external information including notes and imaging from  primary care  provider As well as notes that were available from care everywhere and other healthcare systems.  Past medical history, social, surgical and family history all reviewed in electronic medical record.  No pertanent information unless stated regarding to the chief complaint.   Review of Systems:  No headache, visual changes, nausea, vomiting, diarrhea, constipation, dizziness, abdominal pain, skin rash, fevers, chills, night sweats, weight loss, swollen lymph nodes, body aches, joint swelling, chest pain, shortness of breath, mood changes. POSITIVE muscle aches  Objective  Blood pressure 102/76, pulse 98, height 5' (1.524 m), weight 116 lb (52.6 kg), last menstrual period 06/03/2014, SpO2 99 %.   General: No apparent distress alert and oriented x3 mood and affect normal, dressed appropriately.  HEENT: Pupils equal, extraocular movements intact  Respiratory: Patient's speak in full sentences and does not appear short of breath  Cardiovascular: No lower extremity edema, non tender, no erythema  Gait normal with good balance and coordination.  MSK:   Left knee exam shows the patient does have good range of motion. Negative McMurray's today which is an improvement. Full range of motion otherwise. Negative Thessaly Low back exam does show tenderness to palpation mostly around the L5 and S1 right greater than left. Mild positive Pearlean Brownie. Tightness of the hip flexor still noted. Neurovascular intact distally with 5 out of 5 strength  Limited musculoskeletal ultrasound was performed and interpreted by [name]  Limited ultrasound of patient's left knee shows that there is the still the meniscal tear but no significant displacement. No surrounding hypoechoic changes. No changes of the patellofemoral joint either at this time Impression: Interval improvement  OMT findings T4 extended rotated and side bent right L3 flexed rotated and side bent left Sacrum right on right   Impression and  Recommendations:     The above documentation has been reviewed and is accurate and complete [name], DO

## 2020-04-24 ENCOUNTER — Other Ambulatory Visit: Payer: Self-pay

## 2020-04-24 ENCOUNTER — Ambulatory Visit: Payer: Managed Care, Other (non HMO) | Admitting: Family Medicine

## 2020-04-24 ENCOUNTER — Ambulatory Visit: Payer: Self-pay

## 2020-04-24 ENCOUNTER — Encounter: Payer: Self-pay | Admitting: Family Medicine

## 2020-04-24 VITALS — BP 102/76 | HR 98 | Ht 60.0 in | Wt 116.0 lb

## 2020-04-24 DIAGNOSIS — M999 Biomechanical lesion, unspecified: Secondary | ICD-10-CM | POA: Diagnosis not present

## 2020-04-24 DIAGNOSIS — M25562 Pain in left knee: Secondary | ICD-10-CM | POA: Diagnosis not present

## 2020-04-24 DIAGNOSIS — M545 Low back pain, unspecified: Secondary | ICD-10-CM

## 2020-04-24 DIAGNOSIS — S83242S Other tear of medial meniscus, current injury, left knee, sequela: Secondary | ICD-10-CM | POA: Diagnosis not present

## 2020-04-24 NOTE — Patient Instructions (Signed)
Ok to push things some Do not need to wear brace for everything Back IBU and Ice See me again in 6-8 weeks IF knee is not better will consider MRI

## 2020-04-24 NOTE — Assessment & Plan Note (Signed)
Continues to have pain, mild exacerbation, known arthritic changes no radiation of the pain well. Discussed posture and ergonomics, which activities to do which wants to avoid. Responds fairly well to manipulation. We'll get repeat x-rays at follow-up with patient declining them today. Follow-up in 6 to 8 weeks

## 2020-04-24 NOTE — Assessment & Plan Note (Signed)
Patient continues to improve. Ultrasound does not show anything significant at this time. I believe patient should do well and does not need to wear the braces regularly. Discussed posture and ergonomics, discussed home exercises. Follow-up with me again 6 to 8 weeks if worsening pain I do feel at this point with her increase in activity an MRI would be necessary.

## 2020-04-24 NOTE — Assessment & Plan Note (Signed)

## 2020-04-25 ENCOUNTER — Ambulatory Visit: Payer: Managed Care, Other (non HMO) | Admitting: Family Medicine

## 2020-06-10 NOTE — Progress Notes (Signed)
[facility] [address] [place] [address] Phone: [phone] Subjective:   I Rachel Golden am serving as a Neurosurgeon for Dr. Antoine Primas.  This visit occurred during the SARS-CoV-2 public health emergency.  Safety protocols were in place, including screening questions prior to the visit, additional usage of staff PPE, and extensive cleaning of exam room while observing appropriate contact time as indicated for disinfecting solutions.   I'm seeing this patient by the request  of:  Rachel Rainier, MD  CC: Low back pain follow-up,  [id number]   04/24/2020 Patient continues to improve. Ultrasound does not show anything significant at this time. I believe patient should do well and does not need to wear the braces regularly. Discussed posture and ergonomics, discussed home exercises. Follow-up with me again 6 to 8 weeks if worsening pain I do feel at this point with her increase in activity an MRI would be necessary.   Update 06/11/2020 Rachel Golden is a 55 y.o. female coming in with complaint of left knee and back pain. Patient states the knee is doing well. Ran this morning and states she now has pain on top of the foot. Feels the pain in the feet with heel raises. Today is the first time it has happened.  Patient states before this the foot was not giving her any trouble.  Back is doing relatively well as well.     Past Medical History:  Diagnosis Date  . Chronic back pain   . Stroke [facility])    Past Surgical History:  Procedure Laterality Date  . NASAL SEPTUM SURGERY     Social History   Socioeconomic History  . Marital status: Married    Spouse name: Not on file  . Number of children: 2  . Years of education: 85  . Highest education level: Not on file  Occupational History  . Not on file  Tobacco Use  . Smoking status: Never Smoker  . Smokeless tobacco: Never Used  Vaping Use  . Vaping Use: Never used  Substance and Sexual  Activity  . Alcohol use: Yes    Alcohol/week: 0.0 standard drinks    Comment: 3-4 drinks per week, on occasion  . Drug use: No  . Sexual activity: Not on file  Other Topics Concern  . Not on file  Social History Narrative   Lives with husband and 2 children in a 2 story home.     Works as an Airline pilot part time.     Education: BS degree.   Right handed   Social Determinants of Health   Financial Resource Strain: Not on file  Food Insecurity: Not on file  Transportation Needs: Not on file  Physical Activity: Not on file  Stress: Not on file  Social Connections: Not on file   No Known Allergies Family History  Problem Relation Age of Onset  . Heart murmur Mother        Living, 57  . Alzheimer's disease Father        Living, 39  . Vascular Disease Brother   . Healthy Son   . Healthy Daughter        Current Outpatient Medications (Analgesics):  .  aspirin EC 81 MG tablet, Take 1 tablet (81 mg total) by mouth daily. .  Ibuprofen-Famotidine 800-26.6 MG TABS, Take 1 tablet 3 times daily.   Current Outpatient Medications (Other):  Marland Kitchen  Cholecalciferol (VITAMIN D3) 1000 units CAPS, Take 2,000 Units by mouth. Marland Kitchen  Glucosamine Sulfate (SYNOVACIN PO), Glucosamine   Reviewed prior external information including notes and imaging from  primary care provider As well as notes that were available from care everywhere and other healthcare systems.  Past medical history, social, surgical and family history all reviewed in electronic medical record.  No pertanent information unless stated regarding to the chief complaint.   Review of Systems:  No headache, visual changes, nausea, vomiting, diarrhea, constipation, dizziness, abdominal pain, skin rash, fevers, chills, night sweats, weight loss, swollen lymph nodes, body aches, joint swelling, chest pain, shortness of breath, mood changes. POSITIVE muscle aches mild  Objective  Blood pressure 102/80, pulse 65, height 5' (1.524 m),  weight 122 lb (55.3 kg), last menstrual period 06/03/2014, SpO2 100 %.   General: No apparent distress alert and oriented x3 mood and affect normal, dressed appropriately.  HEENT: Pupils equal, extraocular movements intact  Respiratory: Patient's speak in full sentences and does not appear short of breath  Cardiovascular: No lower extremity edema, non tender, no erythema  Gait normal with good balance and coordination.  MSK:   Left foot exam shows the patient has very mild breakdown of the transverse arch.  Very minorly tender more around the first and second metatarsals proximally.  Patient noted to have good range of motion of the ankle noted.  No swelling noted no erythema.  Low back exam shows the patient does have some mild loss of lordosis. Patient does have very mild tightness of the [facility] right greater than left.  Neurovascularly intact distally.   Osteopathic findings  T7 extended rotated and side bent left L2 flexed rotated and side bent right Sacrum right on right     Impression and Recommendations:     The above documentation has been reviewed and is accurate and complete [name], DO

## 2020-06-11 ENCOUNTER — Ambulatory Visit: Payer: Managed Care, Other (non HMO) | Admitting: Family Medicine

## 2020-06-11 ENCOUNTER — Encounter: Payer: Self-pay | Admitting: Family Medicine

## 2020-06-11 ENCOUNTER — Other Ambulatory Visit: Payer: Self-pay

## 2020-06-11 VITALS — BP 102/80 | HR 65 | Ht 60.0 in | Wt 122.0 lb

## 2020-06-11 DIAGNOSIS — R269 Unspecified abnormalities of gait and mobility: Secondary | ICD-10-CM | POA: Diagnosis not present

## 2020-06-11 DIAGNOSIS — M545 Low back pain, unspecified: Secondary | ICD-10-CM

## 2020-06-11 DIAGNOSIS — M999 Biomechanical lesion, unspecified: Secondary | ICD-10-CM

## 2020-06-11 NOTE — Assessment & Plan Note (Signed)
Patient has been running with somebody who has kept her from going with her regular stride.  I think this is contributing to more of the dorsal pain on her foot.  We discussed the stride length and avoiding slowing down on heels that could be potentially giving her more difficulty.  Patient will continue everything else follow-up with me again in 6 to 8 weeks

## 2020-06-11 NOTE — Assessment & Plan Note (Signed)
Tight hip flexor still noted.  Continues to be right greater than left.  Discussed with patient about stretching more after activity and may be incorporating yoga.  Discussed no significant changes in medication.  Does take ibuprofen when needed.  Patient will continue to increase activity and follow-up with me again in 4 to 8 weeks

## 2020-06-11 NOTE — Assessment & Plan Note (Signed)
   Decision today to treat with OMT was based on Physical Exam  After verbal consent patient was treated with  ME, FPR techniques in  thoracic,  lumbar and sacral areas, all areas are chronic   Patient tolerated the procedure well with improvement in symptoms  Patient given exercises, stretches and lifestyle modifications  See medications in patient instructions if given  Patient will follow up in 6-8 weeks

## 2020-06-11 NOTE — Patient Instructions (Addendum)
Good to see you Watch going up hill with the foot Stretch hip flexors after exercise Yoga 1 time a week See me again in 8 weeks

## 2020-07-26 ENCOUNTER — Ambulatory Visit: Payer: Managed Care, Other (non HMO) | Admitting: Neurology

## 2020-08-01 NOTE — Progress Notes (Deleted)
  [name] [address] [place] [address] Phone: [phone] Subjective:    I'm seeing this patient by the request  of:  Juluis Rainier, MD  CC:   [id number]  Rachel Golden is a 55 y.o. female coming in with complaint of back and neck pain. OMT 06/11/2020. Patient states   Medications patient has been prescribed: None  Taking:         Reviewed prior external information including notes and imaging from previsou exam, outside providers and external EMR if available.   As well as notes that were available from care everywhere and other healthcare systems.  Past medical history, social, surgical and family history all reviewed in electronic medical record.  No pertanent information unless stated regarding to the chief complaint.   Past Medical History:  Diagnosis Date  . Chronic back pain   . Stroke (HCC)     No Known Allergies   Review of Systems:  No headache, visual changes, nausea, vomiting, diarrhea, constipation, dizziness, abdominal pain, skin rash, fevers, chills, night sweats, weight loss, swollen lymph nodes, body aches, joint swelling, chest pain, shortness of breath, mood changes. POSITIVE muscle aches  Objective  Last menstrual period 06/03/2014.   General: No apparent distress alert and oriented x3 mood and affect normal, dressed appropriately.  HEENT: Pupils equal, extraocular movements intact  Respiratory: Patient's speak in full sentences and does not appear short of breath  Cardiovascular: No lower extremity edema, non tender, no erythema  Neuro: Cranial nerves II through XII are intact, neurovascularly intact in all extremities with 2+ DTRs and 2+ pulses.  Gait normal with good balance and coordination.  MSK:  Non tender with full range of motion and good stability and symmetric strength and tone of shoulders, elbows, wrist, hip, knee and ankles bilaterally.  Back - Normal skin, Spine with normal alignment  and no deformity.  No tenderness to vertebral process palpation.  Paraspinous muscles are not tender and without spasm.   Range of motion is full at neck and lumbar sacral regions  Osteopathic findings  C2 flexed rotated and side bent right C6 flexed rotated and side bent left T3 extended rotated and side bent right inhaled rib T9 extended rotated and side bent left L2 flexed rotated and side bent right Sacrum right on right       Assessment and Plan:    Nonallopathic problems  Decision today to treat with OMT was based on Physical Exam  After verbal consent patient was treated with HVLA, ME, FPR techniques in cervical, rib, thoracic, lumbar, and sacral  areas  Patient tolerated the procedure well with improvement in symptoms  Patient given exercises, stretches and lifestyle modifications  See medications in patient instructions if given  Patient will follow up in 4-8 weeks      The above documentation has been reviewed and is accurate and complete Wilford Grist       Note: This dictation was prepared with Dragon dictation along with smaller phrase technology. Any transcriptional errors that result from this process are unintentional.

## 2020-08-06 ENCOUNTER — Ambulatory Visit: Payer: Managed Care, Other (non HMO) | Admitting: Family Medicine

## 2020-09-13 NOTE — Progress Notes (Signed)
[name] [address] [place] [address] Phone: [phone] Subjective:   [name], am serving as a scribe for Dr. Antoine Primas.  This visit occurred during the SARS-CoV-2 public health emergency.  Safety protocols were in place, including screening questions prior to the visit, additional usage of staff PPE, and extensive cleaning of exam room while observing appropriate contact time as indicated for disinfecting solutions.    I'm seeing this patient by the request  of:  Juluis Rainier, MD  CC: Low back pain with radiation down the right leg  HMC:[id number]  Rachel Golden is a 55 y.o. female coming in with complaint of back and neck pain. OMT 06/11/2020. Patient states that she has increase in back pain due to traveling in car that is causing radicular pain in R leg. Pain is dull and in SI joint.  Seems to be worse since she was in the car as well as doing a lot of stairs.  Medications patient has been prescribed: None \         Reviewed prior external information including notes and imaging from previsou exam, outside providers and external EMR if available.   As well as notes that were available from care everywhere and other healthcare systems.  Past medical history, social, surgical and family history all reviewed in electronic medical record.  No pertanent information unless stated regarding to the chief complaint.   Past Medical History:  Diagnosis Date   Chronic back pain    Stroke (HCC)     No Known Allergies   Review of Systems:  No headache, visual changes, nausea, vomiting, diarrhea, constipation, dizziness, abdominal pain, skin rash, fevers, chills, night sweats, weight loss, swollen lymph nodes, body aches, joint swelling, chest pain, shortness of breath, mood changes. POSITIVE muscle aches  Objective  Blood pressure 120/90, pulse 78, height 5' (1.524 m), last menstrual period 06/03/2014, SpO2 98 %.   General:  No apparent distress alert and oriented x3 mood and affect normal, dressed appropriately.  HEENT: Pupils equal, extraocular movements intact  Respiratory: Patient's speak in full sentences and does not appear short of breath  Cardiovascular: No lower extremity edema, non tender, no erythema  Gait normal with good balance and coordination.  MSK:  Non tender with full range of motion and good stability and symmetric strength and tone of shoulders, elbows, wrist, hip, knee and ankles bilaterally.  Back -low back exam does have some mild loss of lordosis.  Patient has tightness with straight leg test on the right but no true radicular symptoms.  Very mild limited range of motion with FABER test bilaterally with tightness noted.  Neurovascularly intact distally with 5 out of 5 strength  Osteopathic findings   T7 extended rotated and side bent left L1 flexed rotated and side bent right Sacrum right on right     Assessment and Plan:  Lumbar radiculopathy, acute Questionable chronic problem with mild exacerbation noted at this time.  Patient has had other medications previously but is doing relatively well.  Responded well to osteopathic manipulation.  Discussed posture and ergonomics.  Patient will continue to try to be active at this point.  No significant weakness noted.  We will get x-rays to further evaluate for any type of progression that could be contributing.  Follow-up again in 5 to 6 weeks   Nonallopathic problems  Decision today to treat with OMT was based on Physical Exam  After verbal consent patient was treated with  HVLA, ME, FPR techniques in  thoracic, lumbar, and sacral  areas  Patient tolerated the procedure well with improvement in symptoms  Patient given exercises, stretches and lifestyle modifications  See medications in patient instructions if given  Patient will follow up in 4-8 weeks      The above documentation has been reviewed and is accurate and complete  [name], DO        Note: This dictation was prepared with Dragon dictation along with smaller phrase technology. Any transcriptional errors that result from this process are unintentional.

## 2020-09-16 ENCOUNTER — Ambulatory Visit (INDEPENDENT_AMBULATORY_CARE_PROVIDER_SITE_OTHER): Payer: Managed Care, Other (non HMO)

## 2020-09-16 ENCOUNTER — Other Ambulatory Visit: Payer: Self-pay

## 2020-09-16 ENCOUNTER — Ambulatory Visit: Payer: Managed Care, Other (non HMO) | Admitting: Family Medicine

## 2020-09-16 ENCOUNTER — Encounter: Payer: Self-pay | Admitting: Family Medicine

## 2020-09-16 VITALS — BP 120/90 | HR 78 | Ht 60.0 in

## 2020-09-16 DIAGNOSIS — M9904 Segmental and somatic dysfunction of sacral region: Secondary | ICD-10-CM | POA: Diagnosis not present

## 2020-09-16 DIAGNOSIS — M5416 Radiculopathy, lumbar region: Secondary | ICD-10-CM | POA: Diagnosis not present

## 2020-09-16 DIAGNOSIS — M9902 Segmental and somatic dysfunction of thoracic region: Secondary | ICD-10-CM | POA: Diagnosis not present

## 2020-09-16 DIAGNOSIS — M9903 Segmental and somatic dysfunction of lumbar region: Secondary | ICD-10-CM

## 2020-09-16 DIAGNOSIS — M545 Low back pain, unspecified: Secondary | ICD-10-CM

## 2020-09-16 NOTE — Patient Instructions (Addendum)
Xray today Swim, bike but stretch afterwards See me again in 6 weeks

## 2020-09-16 NOTE — Assessment & Plan Note (Signed)
Questionable chronic problem with mild exacerbation noted at this time.  Patient has had other medications previously but is doing relatively well.  Responded well to osteopathic manipulation.  Discussed posture and ergonomics.  Patient will continue to try to be active at this point.  No significant weakness noted.  We will get x-rays to further evaluate for any type of progression that could be contributing.  Follow-up again in 5 to 6 weeks

## 2020-09-30 ENCOUNTER — Ambulatory Visit: Payer: Managed Care, Other (non HMO) | Admitting: Family Medicine

## 2020-10-04 ENCOUNTER — Ambulatory Visit: Payer: Managed Care, Other (non HMO) | Admitting: Neurology

## 2020-10-04 ENCOUNTER — Encounter: Payer: Self-pay | Admitting: Neurology

## 2020-10-04 ENCOUNTER — Other Ambulatory Visit: Payer: Self-pay

## 2020-10-04 VITALS — BP 124/85 | HR 71 | Ht 60.0 in | Wt 122.0 lb

## 2020-10-04 DIAGNOSIS — I773 Arterial fibromuscular dysplasia: Secondary | ICD-10-CM | POA: Diagnosis not present

## 2020-10-04 NOTE — Progress Notes (Signed)
Follow-up Visit   Date: 10/04/20    Rachel Golden MRN: [id number] DOB: 05/10/1965   Interim History: Rachel Golden is a 55 y.o. right-handed Caucasian female with chronic back pain, fibromuscular dysplasia and stroke for follow-up of FMD. The patient was accompanied to the clinic by self.  History of present illness: On May 13th 2016, she was picking up a hanger off the floor and developed acute onset of dizziness.  She initially thought it was because she got up too quickly.  She then heard a "swooshing" sound in her head and developed a piercing headache. Swooshing sound resolved within 5 minutes.  Within 30-minutes, she developed right hand tingling and numbness of the lips.  She called 911 who evaluated her and did not feel that her symptoms were consistent with a stroke, so was not taken to the emergency department.  She also recalls having neck pain especially with neck rotation to the right. On May 19th, she was getting out of the car and developed neck stiffness and heard a swooshing sound in her right ear with associated brief but severe headache.  She also has associated vision disturbance described as "kelidoscope" vision, dizzy, and nauseous. She had difficulty walking out of her car to her home, but was able to make it into her home slowly.  Symptoms lasted about 10-minutes.   She again went to see Dr. [name] on the same day who ordered MRI/A of her head.  Imaging shows small infarcts involving the right cerebellum and medial right parieto-occipital region. Vessel imaging was shows vascular irregularity involving bilateral vertebral arteries with ?right dissection, concerning for fibromuscular dysplasia but I do not appreciate this well on my review.  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  Her stroke work-up has been returning largely normal including echocardiogram, holter monitor, and vasculitis/hypercoagulable labs. She was very happy with her evaluation with Dr. Kathyrn Sheriff who  suggested that only if symptoms worsen or she develops new headache, would cerebral angiogram be indicated.    She was started on aspirin 361m and lipitor 271mdaily.  No personal or family history of stroke.  No personal history of hypertension.  Of note, she has received cervical manipulation for stiffness this month, which alleviated her neck discomfort.  UPDATE 10/04/2020:  She is here for 1 year-follow up.  No new complaints.  Neurologically, she is doing great - no headaches, neck pain, or tingling.  She was diagnosed with OSA and is waiting to get CPAP machine.  She is due for surveillance imaging.  [place]    Medications:  Current Outpatient Medications on File Prior to Visit  Medication Sig Dispense Refill   aspirin EC 81 MG tablet Take 1 tablet (81 mg total) by mouth daily.     Cholecalciferol (VITAMIN D3) 1000 units CAPS Take 2,000 Units by mouth.     Glucosamine Sulfate (SYNOVACIN PO) Glucosamine     No current facility-administered medications on file prior to visit.    Allergies: No Known Allergies   Vital Signs:  BP 124/85   Pulse 71   Ht 5' (1.524 m)   Wt 122 lb (55.3 kg)   LMP 06/03/2014 (Approximate)   SpO2 96%   BMI 23.83 kg/m   Neurological Exam: MENTAL STATUS including orientation to time, place, person, recent and remote memory, attention span and concentration, language, and fund of knowledge is normal.  Speech is not dysarthric.  CRANIAL NERVES: Pupils equal round and  reactive to light. No Horner's syndrome.  Normal conjugate, extra-ocular eye movements in all directions of gaze.  No ptosis.  Face is symmetric.   MOTOR:  Motor strength is 5/5 in all extremities.  No pronator drift.  MSRs:  Reflexes are brisk and symmetric 3+/4 throughout.  SENSORY:  Intact to vibration.  COORDINATION/GAIT:    Gait narrow based and stable. Stressed and tandem gait intact.  Data: Labs 08/23/2014:  CRP <0.1, ESR 12, ANA neg, HbA1c 5.5,  LDL 55, HDL 76, C3/C4 normal, ENA neg, ANA neg, RF neg, hypercoagulable panel negative (normal protein C and S activity, no Factor V leiden mutation, normal antithrombin III and anticardiolipin antibody)  Surface echocardiogram 08/31/2014:  normal, EF 60% no thrombus  08/30/2014  48-hr holter:   No significant arrhythmias.  No atrial fib or etiology for an embolic neuro event.  Rare atrial and ventricular ectopy noted.  Predominant rhythm is sinus.     MRI brain 08/27/2014:  Subacute nonhemorrhagic small infarcts involving mid to inferior right cerebellum and medial aspect of the right parietal -occipital lobe.  No intracranial hemorrhage.  MRA head 08/27/2014:  Ectatic cavernous segment internal carotid artery bilaterally. Mild narrowing petrous segment of the internal carotid artery bilaterally. Middle cerebral mild to moderate branch vessel narrowing and irregularity bilaterally greater on the right.   1.6 x 1.6 mm bulge M1 segment right middle cerebral artery appears to be origin of a vessel rather than saccular aneurysm. Stability can be confirmed on follow-up.   Moderate narrowing at the junction of the A1 segment and A2 segment of the right anterior cerebral artery.  Ectatic vertebral arteries and basilar artery without high-grade stenosis.  Full extent of the left posterior inferior cerebellar artery not imaged on present exam. This is noted on the contrast-enhanced MR angiogram of the neck and appears ectatic were proximal narrowing.  Nonvisualized left anterior inferior cerebellar artery.  Narrowing and irregularity superior cerebellar artery greater on the left.  Posterior cerebral artery branch vessel narrowing and irregularity greater on the left.   MRA NECK FINDINGS Ectatic pleated dilated appearance of the vertebral arteries bilaterally suggestive of fibromuscular dysplasia. With the patient's subacute right sided infarcts, subtle focal dissection with subsequent embolic thrombus cannot  be entirely excluded although not detected on the current exam   No significant stenosis of either carotid bifurcation. Ectatic internal carotid artery cervical segment bilaterally may also reflect changes of fibromuscular dysplasia but without pleated appearance as noted involving the vertebral arteries.   MRA head 05/03/2015: 1. 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 No associated stenosis. The bilateral V4 segments appear spared. 2. Bilateral ICA dolichoectasia, without the cervical ICA beaded appearance typical of carotid FMD. No carotid stenosis. 3.  Negative intracranial MRA aside from ICA siphon tortuosity.  MRA head and neck 06/20/2018 performed at [facility]: 1.  Multifocal irregularity of the V2 and V3 segments of both vertebral arteries, which may relate to fibromuscular dysplasia.  No evidence of significant stenosis or occlusion.  Prior outside imaging 127/2017 is not available for direct comparison to assess for progression of these findings. 2.  Extensive tortuosity of the cervical and cavernous bilateral internal carotid arteries, which is nonspecific but can be seen with longstanding hypertension. 3.  No evidence of significant proximal artery narrowing in the head or neck.   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  Clinically, stable without any deficits. Imaging from 2020 shows multifocal irregularity consistent with fibromuscular dysplasia, no progressive changes.  Given the stability on imaging, we have discussed reducing the frequency of imaging.  I will get MRA head and neck and if stable, recheck in 3 years. Continue aspirin 47m daily  Return to clinic in 1 year.   Thank you  for allowing me to participate in patient's care.  If I can answer any additional questions, I would be pleased to do so.    Sincerely,    Yannick Steuber K. PPosey Pronto DO

## 2020-10-04 NOTE — Patient Instructions (Signed)
MRA head and neck.   Return to clinic in 1 year

## 2020-10-23 ENCOUNTER — Other Ambulatory Visit: Payer: Managed Care, Other (non HMO)

## 2020-10-29 ENCOUNTER — Ambulatory Visit: Payer: Managed Care, Other (non HMO) | Admitting: Family Medicine

## 2020-11-01 ENCOUNTER — Other Ambulatory Visit: Payer: Managed Care, Other (non HMO)

## 2020-11-12 ENCOUNTER — Other Ambulatory Visit: Payer: Managed Care, Other (non HMO)

## 2020-11-28 ENCOUNTER — Encounter: Payer: Self-pay | Admitting: Family Medicine

## 2020-12-10 ENCOUNTER — Ambulatory Visit
Admission: RE | Admit: 2020-12-10 | Discharge: 2020-12-10 | Disposition: A | Discharge status: Home / Self Care | Payer: Managed Care, Other (non HMO) | Source: Ambulatory Visit | Attending: Neurology | Admitting: Neurology

## 2020-12-10 ENCOUNTER — Other Ambulatory Visit: Payer: Self-pay

## 2020-12-10 DIAGNOSIS — I773 Arterial fibromuscular dysplasia: Secondary | ICD-10-CM

## 2020-12-10 MED ORDER — GADOBENATE DIMEGLUMINE 529 MG/ML IV SOLN
10.0000 mL | Freq: Once | INTRAVENOUS | Status: AC | PRN
  Administered 2020-12-10: 10 mL via INTRAVENOUS

## 2020-12-12 NOTE — Progress Notes (Signed)
Called patient and informed her that her vessel imaging shows mild progression of FMD. Continue to take aspirin and We will recheck next year. Also, because FMD can also affect blood vessels to the kidney, patient has been informed to follow-up with her PCP to be sure her kidney function is checked and consider renal artery ultrasound as a baseline.   Patient wanted to ask Dr. Allena Katz if it is ok that her neck has been "locking up" every once in awhile. Patient stated that she has been doing exercises to try and help but notices it has been happening alittle more. Patient stated that when she had her past strokes it was one of the symptoms she experienced prior to her stroke. Informed patient that I would send Dr. Allena Katz a message and give her a call back once I hear from her.

## 2020-12-13 ENCOUNTER — Other Ambulatory Visit: Payer: Self-pay

## 2020-12-13 DIAGNOSIS — M542 Cervicalgia: Secondary | ICD-10-CM

## 2020-12-13 DIAGNOSIS — I773 Arterial fibromuscular dysplasia: Secondary | ICD-10-CM

## 2020-12-13 DIAGNOSIS — R29898 Other symptoms and signs involving the musculoskeletal system: Secondary | ICD-10-CM

## 2020-12-16 ENCOUNTER — Other Ambulatory Visit: Payer: Self-pay | Admitting: Family Medicine

## 2020-12-16 DIAGNOSIS — I773 Arterial fibromuscular dysplasia: Secondary | ICD-10-CM

## 2020-12-20 ENCOUNTER — Ambulatory Visit
Admission: RE | Admit: 2020-12-20 | Discharge: 2020-12-20 | Disposition: A | Discharge status: Home / Self Care | Payer: Managed Care, Other (non HMO) | Source: Ambulatory Visit | Attending: Family Medicine | Admitting: Family Medicine

## 2020-12-20 DIAGNOSIS — I773 Arterial fibromuscular dysplasia: Secondary | ICD-10-CM

## 2020-12-27 ENCOUNTER — Other Ambulatory Visit: Payer: Self-pay | Admitting: Family Medicine

## 2020-12-30 ENCOUNTER — Other Ambulatory Visit: Payer: Self-pay | Admitting: Family Medicine

## 2020-12-30 DIAGNOSIS — I773 Arterial fibromuscular dysplasia: Secondary | ICD-10-CM

## 2021-01-08 ENCOUNTER — Telehealth: Payer: Self-pay | Admitting: Neurology

## 2021-01-08 DIAGNOSIS — M542 Cervicalgia: Secondary | ICD-10-CM

## 2021-01-08 DIAGNOSIS — R29898 Other symptoms and signs involving the musculoskeletal system: Secondary | ICD-10-CM

## 2021-01-08 NOTE — Telephone Encounter (Signed)
Patient called and said she is waiting to schedule PT for her neck but she hasn't heard from anyone.

## 2021-01-09 NOTE — Telephone Encounter (Signed)
Called patient and left a detailed message per DPR that I have resent her PT order to [facility] on [facility], and provided phone number [phone] for patient. I informed patient that it looked like her first PT order was sent as an inpatient referral. PT order corrected and patient notified to call if she has any questions or concerns.

## 2021-01-22 NOTE — Progress Notes (Deleted)
  [name] [address] [place] [address] Phone: [phone] Subjective:    I'm seeing this patient by the request  of:  Juluis Rainier, MD  CC:   [id number]  Rachel Golden is a 55 y.o. female coming in with complaint of back and neck pain. OMT 09/16/2020. Patient states   Medications patient has been prescribed: None  Taking:         Reviewed prior external information including notes and imaging from previsou exam, outside providers and external EMR if available.   As well as notes that were available from care everywhere and other healthcare systems.  Past medical history, social, surgical and family history all reviewed in electronic medical record.  No pertanent information unless stated regarding to the chief complaint.   Past Medical History:  Diagnosis Date   Chronic back pain    Sleep apnea    Stroke (HCC)     No Known Allergies   Review of Systems:  No headache, visual changes, nausea, vomiting, diarrhea, constipation, dizziness, abdominal pain, skin rash, fevers, chills, night sweats, weight loss, swollen lymph nodes, body aches, joint swelling, chest pain, shortness of breath, mood changes. POSITIVE muscle aches  Objective  Last menstrual period 06/03/2014.   General: No apparent distress alert and oriented x3 mood and affect normal, dressed appropriately.  HEENT: Pupils equal, extraocular movements intact  Respiratory: Patient's speak in full sentences and does not appear short of breath  Cardiovascular: No lower extremity edema, non tender, no erythema  Neuro: Cranial nerves II through XII are intact, neurovascularly intact in all extremities with 2+ DTRs and 2+ pulses.  Gait normal with good balance and coordination.  MSK:  Non tender with full range of motion and good stability and symmetric strength and tone of shoulders, elbows, wrist, hip, knee and ankles bilaterally.  Back - Normal skin, Spine with  normal alignment and no deformity.  No tenderness to vertebral process palpation.  Paraspinous muscles are not tender and without spasm.   Range of motion is full at neck and lumbar sacral regions  Osteopathic findings  C2 flexed rotated and side bent right C6 flexed rotated and side bent left T3 extended rotated and side bent right inhaled rib T9 extended rotated and side bent left L2 flexed rotated and side bent right Sacrum right on right       Assessment and Plan:    Nonallopathic problems  Decision today to treat with OMT was based on Physical Exam  After verbal consent patient was treated with HVLA, ME, FPR techniques in cervical, rib, thoracic, lumbar, and sacral  areas  Patient tolerated the procedure well with improvement in symptoms  Patient given exercises, stretches and lifestyle modifications  See medications in patient instructions if given  Patient will follow up in 4-8 weeks      The above documentation has been reviewed and is accurate and complete Wilford Grist       Note: This dictation was prepared with Dragon dictation along with smaller phrase technology. Any transcriptional errors that result from this process are unintentional.

## 2021-01-23 ENCOUNTER — Ambulatory Visit: Payer: Managed Care, Other (non HMO) | Admitting: Family Medicine

## 2021-01-24 ENCOUNTER — Other Ambulatory Visit: Payer: Self-pay

## 2021-01-24 ENCOUNTER — Ambulatory Visit: Payer: Managed Care, Other (non HMO) | Attending: Neurology

## 2021-01-24 DIAGNOSIS — R29898 Other symptoms and signs involving the musculoskeletal system: Secondary | ICD-10-CM | POA: Diagnosis present

## 2021-01-24 DIAGNOSIS — M542 Cervicalgia: Secondary | ICD-10-CM | POA: Diagnosis not present

## 2021-01-26 NOTE — Therapy (Signed)
[facility] [address] [place], [place], [address] Phone: [phone]   Fax:  [phone]  Physical Therapy Evaluation  Patient Details  Name: Rachel Golden MRN: [id number] Date of Birth: [birth year] Referring Provider (PT): Glendale Chard, DO   Encounter Date: 01/24/2021   PT End of Session - 01/26/21 2101     Visit Number 1    Number of Visits 5    Date for PT Re-Evaluation 03/29/21    Authorization Type [employer] MANAGED    PT Start Time 1151    PT Stop Time 1240    PT Time Calculation (min) 49 min    Activity Tolerance Patient tolerated treatment well    Behavior During Therapy [facility] for tasks assessed/performed             Past Medical History:  Diagnosis Date   Chronic back pain    Sleep apnea    Stroke [facility])     Past Surgical History:  Procedure Laterality Date   NASAL SEPTUM SURGERY      There were no vitals filed for this visit.    Subjective Assessment - 01/26/21 2210     Subjective Pt reports chronic neck tightness, R>L, with R cervical ache. 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 Yes    Pain Score 4     Pain Location Neck    Pain Orientation Right;Left    Pain Descriptors / Indicators Aching    Pain Type Chronic pain    Pain Onset More than a month ago    Pain Frequency Intermittent                [facility] PT Assessment - 01/26/21 0001       Assessment   Medical Diagnosis Neck pain, Neck tightness    Referring Provider (PT) Nita Sickle K, DO    Onset Date/Surgical Date --   Chronic   Hand Dominance Right      Precautions   Precautions None      Restrictions   Weight Bearing Restrictions No      Balance Screen   Has the patient fallen in the past 6 months No      Home Environment   Living  Environment Private residence    Additional Comments No issue with access home      Prior Function   Level of Independence Independent    Vocation Part time employment    Scientist, 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    Leisure [employer], run, swimming, body balance class      Cognition   Overall Cognitive Status Within Functional Limits for tasks assessed      Observation/Other Assessments   Focus on Therapeutic Outcomes (FOTO)  NA      Sensation   Light Touch Appears Intact      Posture/Postural Control   Posture/Postural Control Postural limitations    Postural Limitations Forward head;Rounded Shoulders;Decreased thoracic kyphosis      ROM / Strength   AROM / PROM / Strength AROM;Strength      AROM   AROM Assessment Site Cervical    Cervical Flexion 50    Cervical Extension 35    Cervical - Right Side Bend 30    Cervical - Left Side  Bend 30    Cervical - Right Rotation 50    Cervical - Left Rotation 60      Strength   Overall Strength Comments UE myotomal screen is negative      Transfers   Transfers Sit to Stand;Stand to Sit    Sit to Stand 7: Independent      Ambulation/Gait   Ambulation/Gait Yes    Ambulation/Gait Assistance 7: Independent    Gait Pattern Within Functional Limits;Step-through pattern                        Objective measurements completed on examination: See above findings.                PT Education - 01/26/21 2101     Education Details Eval findinds, POC, HEP for cervical stretching and ROM    Person(s) Educated Patient    Methods Explanation;Demonstration;Tactile cues;Verbal cues;Handout    Comprehension Verbalized understanding;Returned demonstration;Verbal cues required;Tactile cues required              PT Short Term Goals - 01/26/21 2157       PT SHORT TERM GOAL #1   Title STGs= LTGs               PT Long Term Goals - 01/26/21 2158       PT LONG TERM GOAL #1   Title Pt will Ind in  final HEP to address cervical ROM and posture to maintain achieved LOF    Status New    Target Date 03/29/21      PT LONG TERM GOAL #2   Title Improve cervical rotation to 65d and side bending to 40d to allow pt check for traffic safely by turning head vs currently needing turn her trunk as well.    Baseline see flowsheets    Status New    Target Date 03/29/21      PT LONG TERM GOAL #3   Title Pt will be able demonstrate proper sitting posture and voice understanding of proper work station computer set up to promote decreased cervical pain    Status New    Target Date 03/29/21      PT LONG TERM GOAL #4   Title Pt will report a 50% decrease in her neck pain re: intensity and/or frequency for improved QOL    Status New    Target Date 03/29/21                    Plan - 01/26/21 2120     Clinical Impression Statement Pt presents to PT with chronic cervical pain and decreased ROM. Additionally, pt has postural limitations c forward head, rounded shoulders and increased kyphosis. 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 Pt would like her neck mobility to improved and for her pain to decrease as much as possible. Pt will benefit from skilled PT to address defifits and optimize cervical function.    Personal Factors and Comorbidities Comorbidity 3+    Comorbidities Fibromuscular dysplagia, Hx of stoke, chronic low back pain    Stability/Clinical Decision Making Stable/Uncomplicated    Clinical Decision Making Low    Rehab Potential Good    PT Frequency Other (comment)   1x/2 weeks   PT Duration 8 weeks    PT Treatment/Interventions ADLs/Self Care Home Management;Therapeutic exercise;Manual techniques;Passive range of motion    PT Next Visit Plan Assess response to HEP. Progress ther ex as indicated  PT Home Exercise Plan [email or web address]    Consulted and Agree with Plan of Care Patient             Patient will benefit from skilled therapeutic intervention in order to  improve the following deficits and impairments:  Pain, Decreased mobility, Decreased strength  Visit Diagnosis: Neck pain  Neck tightness  Decreased ROM of neck     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   Fibromuscular dysplasia (HCC) 08/29/2014   Acute headache 08/23/2014   Trigger point of right shoulder region 08/23/2014   Neck pain 08/14/2014   Nonallopathic lesion of sacral region 05/01/2014   Hip flexor tightness 09/06/2013   Nonallopathic lesion of thoracic region 12/13/2012   Lumbar back pain 05/17/2012   Leg length discrepancy 05/17/2012   Nonallopathic lesion of lumbosacral region 05/17/2012    Joellyn Rued MS, PT 01/26/21 10:13 PM   [facility] [address] [place], [place], [address] Phone: [phone]   Fax:  [phone]  Name: Rachel Golden MRN: [id number] Date of Birth: 1965/11/25

## 2021-01-27 ENCOUNTER — Ambulatory Visit: Payer: Managed Care, Other (non HMO) | Admitting: Family Medicine

## 2021-01-29 ENCOUNTER — Ambulatory Visit
Admission: RE | Admit: 2021-01-29 | Discharge: 2021-01-29 | Disposition: A | Discharge status: Home / Self Care | Payer: Managed Care, Other (non HMO) | Source: Ambulatory Visit | Attending: Family Medicine | Admitting: Family Medicine

## 2021-01-29 DIAGNOSIS — I773 Arterial fibromuscular dysplasia: Secondary | ICD-10-CM

## 2021-01-29 MED ORDER — IOPAMIDOL (ISOVUE-370) INJECTION 76%
75.0000 mL | Freq: Once | INTRAVENOUS | Status: AC | PRN
  Administered 2021-01-29: 75 mL via INTRAVENOUS

## 2021-02-06 ENCOUNTER — Other Ambulatory Visit: Payer: Self-pay

## 2021-02-06 ENCOUNTER — Ambulatory Visit: Payer: Managed Care, Other (non HMO) | Attending: Neurology

## 2021-02-06 DIAGNOSIS — M542 Cervicalgia: Secondary | ICD-10-CM

## 2021-02-06 DIAGNOSIS — R29898 Other symptoms and signs involving the musculoskeletal system: Secondary | ICD-10-CM | POA: Diagnosis present

## 2021-02-06 NOTE — Therapy (Addendum)
[facility] Outpatient Rehabilitation [facility] [address] [place], [place], [address] Phone: [phone]   Fax:  [phone]  Physical Therapy Treatment  Patient Details  Name: Rachel Golden MRN: [id number] Date of Birth: 04-Aug-1965 Referring Provider (PT): Glendale Chard, DO   Encounter Date: 02/06/2021   PT End of Session - 02/06/21 1537     Visit Number 2    Number of Visits 5    Date for PT Re-Evaluation 03/29/21    Authorization Type [employer] MANAGED    PT Start Time 1315    PT Stop Time 1355    PT Time Calculation (min) 40 min    Activity Tolerance Patient tolerated treatment well    Behavior During Therapy [facility] for tasks assessed/performed             Past Medical History:  Diagnosis Date   Chronic back pain    Sleep apnea    Stroke [facility])     Past Surgical History:  Procedure Laterality Date   NASAL SEPTUM SURGERY      There were no vitals filed for this visit.   Subjective Assessment - 02/06/21 1307     Subjective Pt reports she still has pain in her neck that feels like a "strain" when she just looks forward and it gets worse when she looks to the right. Pt says she has been doing her HEP and that her neck "feels good". Reports she does her neck stretches in the car but does not understand the chin tuck exercise because she "cant feel it like the other exercises". Pt reports she has been instructed by physician not to have chiropractic manipulations or massages in the neck or around the upper spine. Pt reports these precautions are due to her multiple strokes and fibromuscular dysplagia. However, pt reports she has had manipulations to the thoracic spine.    Pertinent History Fibromuscular dysplagia, Hx of stoke, chronic low back pain    Patient Stated Goals To decrease the pain and improve the neck ROM    Currently in Pain? Yes    Pain Score 3     Pain Location Neck    Pain Orientation Right    Pain Descriptors / Indicators --   strain    Pain Onset More than a month ago    Pain Frequency Constant   and intermittant when looking to R           [facility] Adult PT Treatment/Exercise:  Therapeutic Exercise: - 2x30s bilat cervical SB stretch  - 2x30s bilat levator stretch  - standing rows 14lbs, 2x10  - incline pushups 2x10 *hands under shoulders - pushups *hands above shoulders 1x10  - T's prone thumb up and thumb down bilat 3lbs 1x10 each  - Y's prone thumb up bilat 1x10 each   Manual Therapy: - NA  Neuromuscular re-ed: - Hold relax PNF for Lt SB ROM 2x10  - chin tucks 10s holds x10 seated   Therapeutic Activity: - NA  Self-care/Home Management: - NA     [facility] PT Assessment - 02/06/21 0001       Precautions   Precaution Comments Pt reports she has been instructed by the physician that she cannot have chiropractic or manual therapy to neck or spine due to fibromuscular dysplagia and hx of stroke.      AROM   Overall AROM Comments Bilat shoulder flexion, abduction, IR and ER WFL; Rt IR has significant scapular winging present      Strength  Overall Strength Comments Bilat rhomboids 4-/5; bilat middle trap 4-/5; bilat lower trap 3+/5 bilat, serratus 4-/5 bilat   significant verbal and tactile cues required for position   Right Shoulder Flexion 4+/5    Right Shoulder ABduction 4-/5    Right Shoulder Internal Rotation 4+/5    Right Shoulder External Rotation 4-/5    Left Shoulder Flexion 4-/5    Left Shoulder ABduction 4+/5    Left Shoulder Internal Rotation 4-/5    Left Shoulder External Rotation 4-/5      Special Tests   Other special tests DNF endurance test: 30secs   reports "strain" in neck                                     PT Short Term Goals - 01/26/21 2157       PT SHORT TERM GOAL #1   Title STGs= LTGs               PT Long Term Goals - 01/26/21 2158       PT LONG TERM GOAL #1   Title Pt will Ind in final HEP to address cervical ROM and posture to  maintain achieved LOF    Status New    Target Date 03/29/21      PT LONG TERM GOAL #2   Title Improve cervical rotation to 65d and side bending to 40d to allow pt check for traffic safely by turning head vs currently needing turn her trunk as well.    Baseline see flowsheets    Status New    Target Date 03/29/21      PT LONG TERM GOAL #3   Title Pt will be able demonstrate proper sitting posture and voice understanding of proper work station computer set up to promote decreased cervical pain    Status New    Target Date 03/29/21      PT LONG TERM GOAL #4   Title Pt will report a 50% decrease in her neck pain re: intensity and/or frequency for improved QOL    Status New    Target Date 03/29/21                   Plan - 02/06/21 1539     Clinical Impression Statement Pt presents with visually tight upper trapezius muscles and levator scapula with reports of tightness and pain. Pt presents with decreased muscle bulk along thoracic paraspinals and periscapular musculature with corresponding decrease in muscular strength as well, see flowsheet. Pt required verbal cues and tactile cues for decrease in upper trapezius activation during rhomboid, serratus, middle trapezius, and lower trapezius muscle testing. Pt reports " feeling good" in Rt side of neck after PNF hold relax during Lt cervical SB and demostrates improved ROM in Lt SB. Pt tolerated todays session well and reports muscle fatigue in shoulder musculature and no increase in pain at the end of the session. Given pt reports of precautions against manual therapy techinuqes PT will followup with PCP on appropriate and safe manual techniques that can be performed. Pt given updated HEP.    Personal Factors and Comorbidities Comorbidity 3+    Comorbidities Fibromuscular dysplagia, Hx of stoke, chronic low back pain    Stability/Clinical Decision Making Stable/Uncomplicated    Rehab Potential Good    PT Frequency Other (comment)    1x/2 weeks   PT Duration 8 weeks  PT Treatment/Interventions ADLs/Self Care Home Management;Therapeutic exercise;Manual techniques;Passive range of motion    PT Next Visit Plan Assess response to HEP. Progress ther ex as indicated    PT Home Exercise Plan [id number]    Consulted and Agree with Plan of Care Patient             Patient will benefit from skilled therapeutic intervention in order to improve the following deficits and impairments:  Pain, Decreased mobility, Decreased strength  Visit Diagnosis: No diagnosis found.     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 [place], [place], [address] Phone: [phone]   Fax:  [phone]  Name: Rachel Golden MRN: [id number] Date of Birth: [birth year]

## 2021-02-10 ENCOUNTER — Telehealth: Payer: Self-pay | Admitting: Neurology

## 2021-02-10 NOTE — Telephone Encounter (Signed)
Patient called and said she is continuing to have dull pain that is more in her head, not her neck. She has realized this since starting PT, she said.   She has been recommended for "dry needling" by her PT she said. They told her they were reaching out to Dr. Allena Katz and she wants to be sure they did.

## 2021-02-10 NOTE — Telephone Encounter (Signed)
Yes, her PT did contact me. It is OK for her to have dry needling.

## 2021-02-11 NOTE — Telephone Encounter (Signed)
Called patient and informed her that Dr. Allena Katz was contacted by PT and that Dr. Allena Katz is ok with Dr. Gloriann Loan. Patient asked if she should come to see Dr. Allena Katz. I informed patient that if the dry needing that was recommended by PT doesn't work definitely give Dr. Allena Katz a call to let her know. Patient verbalized understanding and had no further questions or concerns.

## 2021-02-19 ENCOUNTER — Encounter: Payer: Self-pay | Admitting: Vascular Surgery

## 2021-02-19 ENCOUNTER — Ambulatory Visit: Payer: Managed Care, Other (non HMO) | Admitting: Vascular Surgery

## 2021-02-19 ENCOUNTER — Other Ambulatory Visit: Payer: Self-pay

## 2021-02-19 VITALS — BP 123/64 | HR 69 | Temp 98.7°F | Resp 20 | Ht 60.0 in | Wt 130.0 lb

## 2021-02-19 DIAGNOSIS — I773 Arterial fibromuscular dysplasia: Secondary | ICD-10-CM

## 2021-02-19 NOTE — Progress Notes (Signed)
Patient ID: Rachel Golden, female   DOB: 01/04/1966, 55 y.o.   MRN: [id number]  Reason for Consult: New Patient (Initial Visit)   Referred by Juluis Rainier, MD  Subjective:     HPI:  Rachel Golden is a 55 y.o. female known history of fibromuscular dysplasia.  Previously had 2 strokes in 2016.  She is now on aspirin has had no further strokes.  She does have a brother with fibromuscular dysplasia no other vascular history in her family.  She is very active continues to run 5 and 10 kilometers races frequently.  She has no lower extremity claudication.  She does not have any personal or family history of aneurysm disease.  She does not have hypertension.  She has no diabetes.  She is a lifelong non-smoker.  Past Medical History:  Diagnosis Date   Chronic back pain    Sleep apnea    Stroke [facility])    Family History  Problem Relation Age of Onset   Heart murmur Mother        Living, 27   Alzheimer's disease Father        Living, 35   Vascular Disease Brother    Healthy Son    Healthy Daughter    Past Surgical History:  Procedure Laterality Date   NASAL SEPTUM SURGERY      Short Social History:  Social History   Tobacco Use   Smoking status: Never   Smokeless tobacco: Never  Substance Use Topics   Alcohol use: Yes    Alcohol/week: 0.0 standard drinks    Comment: 3-4 drinks per week, on occasion    No Known Allergies  Current Outpatient Medications  Medication Sig Dispense Refill   aspirin EC 81 MG tablet Take 1 tablet (81 mg total) by mouth daily.     Cholecalciferol (VITAMIN D3) 1000 units CAPS Take 2,000 Units by mouth.     Glucosamine Sulfate (SYNOVACIN PO) Glucosamine     Probiotic Product (PROBIOTIC DAILY PO) Take by mouth.     No current facility-administered medications for this visit.    Review of Systems  Constitutional:  Constitutional negative. HENT: HENT negative.  Eyes: Eyes negative.  Respiratory: Respiratory negative.  Cardiovascular:  Cardiovascular negative.  GI: Gastrointestinal negative.  Musculoskeletal:       Neck pain Skin: Skin negative.  Neurological: Neurological negative. Hematologic: Hematologic/lymphatic negative.  Psychiatric: Psychiatric negative.       Objective:  Objective   Vitals:   02/19/21 0842  BP: 123/64  Pulse: 69  Resp: 20  Temp: 98.7 F (37.1 C)  SpO2: 96%  Weight: 130 lb (59 kg)  Height: 5' (1.524 m)   Body mass index is 25.39 kg/m.  Physical Exam HENT:     Head: Normocephalic.     Nose:     Comments: Wearing a mask Eyes:     Pupils: Pupils are equal, round, and reactive to light.  Cardiovascular:     Rate and Rhythm: Normal rate.  Pulmonary:     Effort: Pulmonary effort is normal.  Abdominal:     General: Abdomen is flat.     Palpations: Abdomen is soft. There is no mass.  Musculoskeletal:        General: Normal range of motion.     Cervical back: Normal range of motion.  Skin:    General: Skin is warm and dry.     Capillary Refill: Capillary refill takes less than 2 seconds.  Neurological:     General:  No focal deficit present.     Mental Status: She is alert.  Psychiatric:        Mood and Affect: Mood normal.        Behavior: Behavior normal.        Thought Content: Thought content normal.    Data: CT IMPRESSION: VASCULAR   1. Changes of fibromuscular dysplasia in the proximal right renal and superior mesenteric arteries. 2. No evidence of aneurysm or dissection. 3. Tortuous abdominal aorta consistent with the clinical history of systemic hypertension.   NON-VASCULAR   1. No acute or clinically significant abnormality in the abdomen.     Assessment/Plan:     55 year old female with history of fibromuscular dysplasia which appears to have previously led to 2 strokes but has been asymptomatic since 2016.  We reviewed her MRA which demonstrates vertebral artery fibromuscular dysplasia although her carotid arteries appear pristine.  We also reviewed  her CTA which demonstrates fibromuscular dysplasia in her right renal artery as well as her superior mesenteric artery which is quite prominent however there does not appear to be any stenosis.  She will continue aspirin.  From a vascular standpoint she can follow-up in 2 years with carotid and renal artery duplexes.    Maeola Harman MD [facility]

## 2021-02-21 ENCOUNTER — Ambulatory Visit: Payer: Managed Care, Other (non HMO)

## 2021-02-21 ENCOUNTER — Other Ambulatory Visit: Payer: Self-pay

## 2021-02-21 DIAGNOSIS — M542 Cervicalgia: Secondary | ICD-10-CM

## 2021-02-21 DIAGNOSIS — R29898 Other symptoms and signs involving the musculoskeletal system: Secondary | ICD-10-CM

## 2021-02-21 NOTE — Patient Instructions (Signed)

## 2021-02-21 NOTE — Therapy (Signed)
[facility] Outpatient Rehabilitation [facility] [address] [place], [place], [address] Phone: [phone]   Fax:  [phone]  Physical Therapy Treatment  Patient Details  Name: Rachel Golden MRN: [id number] Date of Birth: 09/26/1965 Referring Provider (PT): Glendale Chard, DO   Encounter Date: 02/21/2021   PT End of Session - 02/21/21 0845     Visit Number 3    Number of Visits 5    Date for PT Re-Evaluation 03/29/21    Authorization Type [employer] MANAGED    PT Start Time 0845    PT Stop Time 0929   4 minutes TPDN   PT Time Calculation (min) 44 min    Activity Tolerance Patient tolerated treatment well    Behavior During Therapy [facility] for tasks assessed/performed             Past Medical History:  Diagnosis Date   Chronic back pain    Sleep apnea    Stroke [facility])     Past Surgical History:  Procedure Laterality Date   NASAL SEPTUM SURGERY      There were no vitals filed for this visit.   Subjective Assessment - 02/21/21 1030     Subjective Patient reports she continues to have pain at the base of the skull on the Rt side. She is interested in receiving TPDN and even spoke with the vascular specialists about TPDN who told her this would be ok to receive.    Pertinent History Fibromuscular dysplagia, Hx of stoke, chronic low back pain    Currently in Pain? Yes    Pain Score 3     Pain Location Neck    Pain Orientation Right;Posterior    Pain Descriptors / Indicators Other (Comment)   strain   Pain Type Chronic pain    Pain Onset More than a month ago    Pain Frequency Constant                [facility] PT Assessment - 02/21/21 0001       Precautions   Precaution Comments Per Dr. Allena Katz ok to complete manual therapy and dry needling; no high velocity manipulation of the neck      AROM   Cervical Flexion 43    Cervical Extension 45   50 post manual; decreased pain at base of cranium   Cervical - Right Rotation 60   79 post manual   Cervical  - Left Rotation 60      Palpation   Palpation comment TTP base of Rt cranium                [facility] Adult PT Treatment/Exercise:  Therapeutic Exercise: - Rt upper trap stretch 2 x 30 sec - Rt levator scap stretch 2 x 30 sec - UBE level 1 2 min each fwd/bwd  Manual Therapy: - STM/DTM Rt upper trap, levator scapulae, cervical paraspinals - suboccipital release  Neuromuscular re-ed: - n/a  Therapeutic Activity: - n/a  Self-care/Home Management: - education on TPDN indications, side effects, expectations; handout provided               Trigger Point Dry Needling - 02/21/21 0001     Consent Given? Yes    Education Handout Provided Yes    Muscles Treated Head and Neck Upper trapezius;Levator scapulae    Upper Trapezius Response Twitch reponse elicited;Palpable increased muscle length    Levator Scapulae Response Twitch response elicited;Palpable increased muscle length  PT Short Term Goals - 01/26/21 2157       PT SHORT TERM GOAL #1   Title STGs= LTGs               PT Long Term Goals - 01/26/21 2158       PT LONG TERM GOAL #1   Title Pt will Ind in final HEP to address cervical ROM and posture to maintain achieved LOF    Status New    Target Date 03/29/21      PT LONG TERM GOAL #2   Title Improve cervical rotation to 65d and side bending to 40d to allow pt check for traffic safely by turning head vs currently needing turn her trunk as well.    Baseline see flowsheets    Status New    Target Date 03/29/21      PT LONG TERM GOAL #3   Title Pt will be able demonstrate proper sitting posture and voice understanding of proper work station computer set up to promote decreased cervical pain    Status New    Target Date 03/29/21      PT LONG TERM GOAL #4   Title Pt will report a 50% decrease in her neck pain re: intensity and/or frequency for improved QOL    Status New    Target Date 03/29/21                    Plan - 02/21/21 2725     Clinical Impression Statement After confirming with referring provider that manual techniques including soft tissue mobilization and trigger point dry needling were safe to perform given patient's history of  fibromuscular dysplasia  these techniques were implemented during today's session. She was noted to have significant tautness and trigger points throughout Rt upper trap and levator scapulae. Excellent twitch respone and increased muscle length noted with TPDN of each muscle. She was noted to have improvement in cervical extension and Rt rotation following manual therapy as well as decreased palpable tenderness about base of cranium. Overall she tolerated session well today reporting soreness at end of session.    Personal Factors and Comorbidities Comorbidity 3+    Comorbidities Fibromuscular dysplagia, Hx of stoke, chronic low back pain    Stability/Clinical Decision Making Stable/Uncomplicated    Rehab Potential Good    PT Frequency Other (comment)   1x/2 weeks   PT Duration 8 weeks    PT Treatment/Interventions ADLs/Self Care Home Management;Therapeutic exercise;Manual techniques;Passive range of motion;Dry needling    PT Next Visit Plan Assess response to HEP. Progress ther ex as indicated    PT Home Exercise Plan [id number]    Consulted and Agree with Plan of Care Patient             Patient will benefit from skilled therapeutic intervention in order to improve the following deficits and impairments:  Pain, Decreased mobility, Decreased strength  Visit Diagnosis: Neck pain - Plan: PT plan of care cert/re-cert  Neck tightness - Plan: PT plan of care cert/re-cert  Decreased ROM of neck - Plan: PT plan of care cert/re-cert     Problem List Patient Active Problem List   Diagnosis Date Noted   Ganglion cyst of dorsum of right wrist 01/25/2019   Acute medial meniscus tear of left knee 05/18/2018   Hamstring injury, left, initial  encounter 02/02/2017   Hamstring injury, right, initial encounter 08/12/2016   Viral upper respiratory infection 03/27/2016   Trigger point of left shoulder  region 07/02/2015   Abnormality of gait 02/13/2015   Lumbar radiculopathy, acute 12/04/2014   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 [place], [place], [address] Phone: [phone]   Fax:  [phone]  Name: Rachel Golden MRN: [id number] Date of Birth: 1965-07-23

## 2021-02-25 ENCOUNTER — Other Ambulatory Visit: Payer: Self-pay

## 2021-02-25 ENCOUNTER — Ambulatory Visit: Payer: Managed Care, Other (non HMO)

## 2021-02-25 DIAGNOSIS — R29898 Other symptoms and signs involving the musculoskeletal system: Secondary | ICD-10-CM

## 2021-02-25 DIAGNOSIS — M542 Cervicalgia: Secondary | ICD-10-CM

## 2021-02-25 NOTE — Therapy (Addendum)
[facility] Outpatient Rehabilitation [facility] [address] [place], [place], [address] Phone: [phone]   Fax:  [phone]  Physical Therapy Treatment  Patient Details  Name: Rachel Golden MRN: [id number] Date of Birth: Aug 17, 1965 Referring Provider (PT): Glendale Chard, DO   Encounter Date: 02/25/2021   PT End of Session - 02/25/21 1543     Visit Number 4    Number of Visits 5    Date for PT Re-Evaluation 03/29/21    Authorization Type [employer] MANAGED    PT Start Time 1445    PT Stop Time 1528    PT Time Calculation (min) 43 min    Activity Tolerance Patient tolerated treatment well    Behavior During Therapy [facility] for tasks assessed/performed             Past Medical History:  Diagnosis Date   Chronic back pain    Sleep apnea    Stroke [facility])     Past Surgical History:  Procedure Laterality Date   NASAL SEPTUM SURGERY      There were no vitals filed for this visit.   Subjective Assessment - 02/25/21 1447     Subjective Patient said she noticed alot of improvement in pain after dry needling last session. She says she doesnt have "pain at the base of the skull anymore".  Pt reports "bruising and soreness" in her upper trapezius muscle.    Pertinent History Fibromuscular dysplagia, Hx of stoke, chronic low back pain    Currently in Pain? Yes    Pain Score 6     Pain Location Neck    Pain Orientation Right;Posterior    Pain Descriptors / Indicators Sore    Pain Type Chronic pain    Pain Onset More than a month ago    Pain Frequency Constant    Aggravating Factors  Rt cervical rotation    Pain Relieving Factors stretching                [facility] PT Assessment - 02/25/21 0001       Observation/Other Assessments   Skin Integrity Minimal bruising in Rt upper trapezius muscle.      Palpation   Palpation comment TTP in Rt upper trapezius, levator, and cervical paraspinals.                                 [facility]  Adult PT Treatment/Exercise:   Therapeutic Exercise: - Cervical rotation stretch with towel 3x30s each side  - Cervical side bend with towel 2x30s  - Shoulder IR Rt only with towel 3x30s     Manual Therapy: - STM/DTM Rt upper trap, levator scapulae, cervical paraspinals - suboccipital release   Neuromuscular re-ed: - n/a   Therapeutic Activity: - n/a   Self-care/Home Management: - NA     PT Short Term Goals - 01/26/21 2157       PT SHORT TERM GOAL #1   Title STGs= LTGs               PT Long Term Goals - 01/26/21 2158       PT LONG TERM GOAL #1   Title Pt will Ind in final HEP to address cervical ROM and posture to maintain achieved LOF    Status New    Target Date 03/29/21      PT LONG TERM GOAL #2   Title Improve cervical rotation to 65d and side bending  to 40d to allow pt check for traffic safely by turning head vs currently needing turn her trunk as well.    Baseline see flowsheets    Status New    Target Date 03/29/21      PT LONG TERM GOAL #3   Title Pt will be able demonstrate proper sitting posture and voice understanding of proper work station computer set up to promote decreased cervical pain    Status New    Target Date 03/29/21      PT LONG TERM GOAL #4   Title Pt will report a 50% decrease in her neck pain re: intensity and/or frequency for improved QOL    Status New    Target Date 03/29/21                   Plan - 02/25/21 1546     Clinical Impression Statement Pt reports no symtpoms in the base of the cranium and reduced symtpoms in Rt neck and shoulder following previous treatment session. Pt indicates soreness along area with bruise, plan to reassess at next visit. Reports decreased "soreness" and "stiffness" in Rt upper trapezius muscle, thoracic paraspinals, and cervical paraspinals following manual therapy. Able to add cervical rotation, side bend, and shoulder IR stretches with pt overpressure with reports of moderate  stretch and improved mobility. Overall she tolerated session well today reporting decreased soreness at the end of session.    Personal Factors and Comorbidities Comorbidity 3+    Comorbidities Fibromuscular dysplagia, Hx of stoke, chronic low back pain    Stability/Clinical Decision Making Stable/Uncomplicated    Rehab Potential Good    PT Frequency Other (comment)   1x/2 weeks   PT Duration 8 weeks    PT Treatment/Interventions ADLs/Self Care Home Management;Therapeutic exercise;Manual techniques;Passive range of motion;Dry needling    PT Next Visit Plan Assess response to HEP. Progress ther ex as indicated. Assess bruised area of UT and soreness.    PT Home Exercise Plan [id number]    Consulted and Agree with Plan of Care Patient             Patient will benefit from skilled therapeutic intervention in order to improve the following deficits and impairments:  Pain, Decreased mobility, Decreased strength  Visit Diagnosis: No diagnosis found.     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   Fibromuscular dysplasia (HCC) 08/29/2014   Acute headache 08/23/2014   Trigger point of right shoulder region 08/23/2014   Neck pain 08/14/2014   Nonallopathic lesion of sacral region 05/01/2014   Hip flexor tightness 09/06/2013   Nonallopathic lesion of thoracic region 12/13/2012   Lumbar back pain 05/17/2012   Leg length discrepancy 05/17/2012   Nonallopathic lesion of lumbosacral region 05/17/2012    Rachel Golden, SPT 02/25/21 4:13  PM   [facility] Outpatient Rehabilitation [facility] [address] [place], [place], [address] Phone: [phone]   Fax:  [phone]  Name: Rachel Golden MRN: [id number] Date of Birth: 1966/02/28

## 2021-03-04 NOTE — Progress Notes (Signed)
[name] [address] [place] [address] Phone: [phone] Subjective:   [name], am serving as a scribe for Dr. Antoine Primas. This visit occurred during the SARS-CoV-2 public health emergency.  Safety protocols were in place, including screening questions prior to the visit, additional usage of staff PPE, and extensive cleaning of exam room while observing appropriate contact time as indicated for disinfecting solutions.   I'm seeing this patient by the request  of:  Jarrett Soho, PA-C  CC: Back pain follow-up  [id number]  Rachel Golden is a 55 y.o. female coming in with complaint of back and neck pain. OMT on 09/16/2020. Patient states that she has been doing physical therapy for her upper back since she could not get into see Dr. Katrinka Blazing. Order placed by Dr. Allena Katz. Has seen a few providers recently for fibromuscular dysplasia. Pain was in R occiput and she had dry needling which alleviated her pain. Patient has been gaining weight over past year. No change in activity or diet. Is using CPAP.  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  Patient has not had any significant difficulty otherwise.  Has not had any more headaches or anything more.  Just trying to run on a more regular basis.  Feels that physical therapy has been more beneficial.  Medications patient has been prescribed: None          Reviewed prior external information including notes and imaging from previsou exam, outside providers and external EMR if available.   As well as notes that were available from care everywhere and other healthcare systems.  Past medical history, social, surgical and family history all reviewed in electronic medical record.  No pertanent information unless stated regarding to the chief complaint.   Past Medical History:   Diagnosis Date   Chronic back pain    Sleep apnea    Stroke (HCC)     No Known Allergies   Review of Systems:  No headache, visual changes, nausea, vomiting, diarrhea, constipation, dizziness, abdominal pain, skin rash, fevers, chills, night sweats, weight loss, swollen lymph nodes, body aches, joint swelling, chest pain, shortness of breath, mood changes. POSITIVE muscle aches  Objective  Blood pressure 102/78, pulse 73, height 5' (1.524 m), weight 131 lb (59.4 kg), last menstrual period 06/03/2014, SpO2 96 %.   General: No apparent distress alert and oriented x3 mood and affect normal, dressed appropriately.  HEENT: Pupils equal, extraocular movements intact  Respiratory: Patient's speak in full sentences and does not appear short of breath  Cardiovascular: No lower extremity edema, non tender, no erythema  Back exam does have some mild loss of lordosis.  Tightness noted in the parascapular region on the left greater than the right.  Patient does have tightness of the straight leg test right greater than left.  No true radicular symptoms.  Continue tightness of the hip flexors.  Osteopathic findings   T8 extended rotated and side bent left inhaled rib on the left L2 flexed rotated and side bent right Sacrum right on right       Assessment and Plan:  Trigger point of left shoulder region Patient has had difficulty before.  Does have trigger points noted.  Tightness of the lumbar spine also noted especially of the sacroiliac joint on the right side.  Responds well to the manipulation.  Discussed continuing to stay  active.  Patient has been able to run on a more regular basis as well.  Follow-up again in 6 to 8 weeks.   Nonallopathic problems  Decision today to treat with OMT was based on Physical Exam  After verbal consent patient was treated with HVLA, ME, FPR techniques in   thoracic, lumbar, and sacral  areas  Patient tolerated the procedure well with improvement in  symptoms  Patient given exercises, stretches and lifestyle modifications  See medications in patient instructions if given  Patient will follow up in 4-8 weeks     The above documentation has been reviewed and is accurate and complete [name], DO        Note: This dictation was prepared with Dragon dictation along with smaller phrase technology. Any transcriptional errors that result from this process are unintentional.

## 2021-03-05 ENCOUNTER — Encounter: Payer: Self-pay | Admitting: Family Medicine

## 2021-03-05 ENCOUNTER — Ambulatory Visit: Payer: Managed Care, Other (non HMO)

## 2021-03-05 ENCOUNTER — Other Ambulatory Visit: Payer: Self-pay

## 2021-03-05 ENCOUNTER — Ambulatory Visit: Payer: Managed Care, Other (non HMO) | Admitting: Family Medicine

## 2021-03-05 VITALS — BP 102/78 | HR 73 | Ht 60.0 in | Wt 131.0 lb

## 2021-03-05 DIAGNOSIS — M9903 Segmental and somatic dysfunction of lumbar region: Secondary | ICD-10-CM

## 2021-03-05 DIAGNOSIS — M9902 Segmental and somatic dysfunction of thoracic region: Secondary | ICD-10-CM | POA: Diagnosis not present

## 2021-03-05 DIAGNOSIS — M542 Cervicalgia: Secondary | ICD-10-CM

## 2021-03-05 DIAGNOSIS — M25512 Pain in left shoulder: Secondary | ICD-10-CM | POA: Diagnosis not present

## 2021-03-05 DIAGNOSIS — M9904 Segmental and somatic dysfunction of sacral region: Secondary | ICD-10-CM

## 2021-03-05 DIAGNOSIS — R29898 Other symptoms and signs involving the musculoskeletal system: Secondary | ICD-10-CM

## 2021-03-05 NOTE — Therapy (Addendum)
[place], [place], [address] Phone: [phone]   Fax:  [phone]  Physical Therapy Treatment/Discharge  Patient Details  Name: Rachel Golden MRN: [id number] Date of Birth: 1966/03/30 Referring Provider (PT): Alda Berthold, DO   Encounter Date: 03/05/2021   PT End of Session - 03/05/21 1426     Visit Number 5    Number of Visits 5    Date for PT Re-Evaluation --   NA discharge   Authorization Type [employer] MANAGED    PT Start Time 5009    PT Stop Time 1208    PT Time Calculation (min) 23 min    Activity Tolerance Patient tolerated treatment well    Behavior During Therapy [facility] for tasks assessed/performed             Past Medical History:  Diagnosis Date   Chronic back pain    Sleep apnea    Stroke [facility])     Past Surgical History:  Procedure Laterality Date   NASAL SEPTUM SURGERY      There were no vitals filed for this visit.   Subjective Assessment - 03/05/21 1706     Subjective Pt reports she no longer has pain in her neck or shoulder. She says she still has some decreased motion with Rt cervical rotation, but it is not painful. Pt says she feels much better and is comfortable with continueing her HEP to further her mobility and strength.    Pertinent History Fibromuscular dysplagia, Hx of stoke, chronic low back pain    Currently in Pain? No/denies                [facility] PT Assessment - 03/05/21 0001       Observation/Other Assessments   Skin Integrity Skin on Rt UT is WNL, no bruising.      AROM   Cervical Flexion 55    Cervical Extension 62    Cervical - Right Side Bend 42    Cervical - Left Side Bend 45    Cervical - Right Rotation 65    Cervical - Left Rotation 67      Strength   Overall Strength Comments Bilat rhomboids 4+5; bilat middle trap 4/5; bilat lower trap 4/5    Right Shoulder Flexion 4+/5    Right Shoulder ABduction 4+/5    Right Shoulder Internal  Rotation 5/5    Right Shoulder External Rotation 5/5    Left Shoulder Flexion 4+/5    Left Shoulder ABduction 4+/5    Left Shoulder Internal Rotation 5/5    Left Shoulder External Rotation 5/5      Special Tests   Other special tests DNF endurance test: 30secs                   [facility] Adult PT Treatment/Exercise:   Therapeutic Exercise:  - N/A Manual Therapy: - NA   Neuromuscular re-ed: - n/a   Therapeutic Activity: - Discussed re-assessment findings, progress, and improvement with rehab goals.     Self-care/Home Management:  - Discussed pt questions about cervical posture devices at home with recommendation to f/u with vascular specialists as the device she purchased puts her neck in extreme extension. - Reviewed appropriate utilization of Theracane and Theragun for improved tissue extensibility and pain management.  - Reviewed her precautions in regards to no cervical manipulations if she seeks chiropractor, dry needling, massage therapist care in the future.  PT Short Term Goals - 01/26/21 2157       PT SHORT TERM GOAL #1   Title STGs= LTGs               PT Long Term Goals - 03/05/21 1449       PT LONG TERM GOAL #1   Title Pt will Ind in final HEP to address cervical ROM and posture to maintain achieved LOF    Status Achieved    Target Date 03/29/21      PT LONG TERM GOAL #2   Title Improve cervical rotation to 65d and side bending to 40d to allow pt check for traffic safely by turning head vs currently needing turn her trunk as well.    Baseline see flowsheets    Status Achieved    Target Date 03/29/21      PT LONG TERM GOAL #3   Title Pt will be able demonstrate proper sitting posture and voice understanding of proper work station computer set up to promote decreased cervical pain    Status Achieved    Target Date 03/29/21      PT LONG TERM GOAL #4   Title Pt will report a 50% decrease in her neck pain  re: intensity and/or frequency for improved QOL    Status Achieved    Target Date 03/29/21                   Plan - 03/05/21 1451     Clinical Impression Statement Patient presents to OPPT with no pain or any functional limitations. Pt reports significant reduction in symptoms in neck and shoulder and overall improved function. Improved cervical AROM in all directions with no increased pain. Pt able to achieve all long term goals and is pleased with their outcomes of OPPT. Therefore, patient is appropriate to be discharged at this time and encouraged to continue with HEP.    Personal Factors and Comorbidities Comorbidity 3+    Comorbidities Fibromuscular dysplagia, Hx of stoke, chronic low back pain    Stability/Clinical Decision Making --    Rehab Potential --    PT Frequency --    PT Duration --    PT Treatment/Interventions ADLs/Self Care Home Management;Therapeutic exercise;Manual techniques;Passive range of motion;Dry needling    PT Next Visit Plan --    PT Home Exercise Plan [id number]    Consulted and Agree with Plan of Care Patient             Patient will benefit from skilled therapeutic intervention in order to improve the following deficits and impairments:     Visit Diagnosis: No diagnosis found.     Problem List Patient Active Problem List   Diagnosis Date Noted   Ganglion cyst of dorsum of right wrist 01/25/2019   Acute medial meniscus tear of left knee 05/18/2018   Hamstring injury, left, initial encounter 02/02/2017   Hamstring injury, right, initial encounter 08/12/2016   Viral upper respiratory infection 03/27/2016   Trigger point of left shoulder region 07/02/2015   Abnormality of gait 02/13/2015   Lumbar radiculopathy, acute 12/04/2014   Arterial ischemic stroke ([place]) 08/29/2014   Fibromuscular dysplasia ([place]) 08/29/2014   Acute headache 08/23/2014   Trigger point of right shoulder region 08/23/2014   Neck pain 08/14/2014   Nonallopathic  lesion of sacral region 05/01/2014   Hip flexor tightness 09/06/2013   Nonallopathic lesion of thoracic region 12/13/2012   Lumbar back pain 05/17/2012   Leg length discrepancy 05/17/2012  Nonallopathic lesion of lumbosacral region 05/17/2012   PHYSICAL THERAPY DISCHARGE SUMMARY  Visits from Start of Care: 5  Current functional level related to goals / functional outcomes: See goals above   Remaining deficits: See impression    Education / Equipment: NA   Patient agrees to discharge. Patient goals were met. Patient is being discharged due to meeting the stated rehab goals.  Glade Lloyd, [place] 03/05/21 5:08 PM   [facility] [address] [place], [place], [address] Phone: [phone]   Fax:  [phone]  Name: Declyn Offield MRN: [id number] Date of Birth: Jun 01, 1965

## 2021-03-05 NOTE — Patient Instructions (Signed)
See me again in 6 weeks DHEA 50mg  daily for 4 weeks Stop for at least 2 weeks then Get trampoline for Christmas!

## 2021-03-05 NOTE — Assessment & Plan Note (Signed)
Patient has had difficulty before.  Does have trigger points noted.  Tightness of the lumbar spine also noted especially of the sacroiliac joint on the right side.  Responds well to the manipulation.  Discussed continuing to stay active.  Patient has been able to run on a more regular basis as well.  Follow-up again in 6 to 8 weeks.

## 2021-03-05 NOTE — Therapy (Deleted)
[place], [place], [address] Phone: [phone]   Fax:  [phone]  Physical Therapy Treatment/Discharge  Patient Details  Name: Rachel Golden MRN: [id number] Date of Birth: 11-15-1965 Referring Provider (PT): Alda Berthold, DO   Encounter Date: 03/05/2021   PT End of Session - 03/05/21 1426     Visit Number 5    Number of Visits 5    Date for PT Re-Evaluation --   NA discharge   Authorization Type [employer] MANAGED    PT Start Time 6962    PT Stop Time 1208    PT Time Calculation (min) 23 min    Activity Tolerance Patient tolerated treatment well    Behavior During Therapy [facility] for tasks assessed/performed             Past Medical History:  Diagnosis Date   Chronic back pain    Sleep apnea    Stroke [facility])     Past Surgical History:  Procedure Laterality Date   NASAL SEPTUM SURGERY      There were no vitals filed for this visit.  [place] Adult PT Treatment/Exercise:   Therapeutic Exercise: - Discussed pt questions about cervical devices for increasing mobility at home, Theracane and Theragun for improved tissue extensibility and pain management.    Manual Therapy: - NA   Neuromuscular re-ed: - n/a   Therapeutic Activity: - Discussed re-assessment findings, progress, and improvement with rehab goals.    Self-care/Home Management: - NA      [facility] PT Assessment - 03/05/21 0001       Observation/Other Assessments   Skin Integrity Skin on Rt UT is WNL, no bruising.      AROM   Cervical Flexion 55    Cervical Extension 62    Cervical - Right Side Bend 42    Cervical - Left Side Bend 45    Cervical - Right Rotation 65    Cervical - Left Rotation 67      Strength   Overall Strength Comments Bilat rhomboids 4+5; bilat middle trap 4/5; bilat lower trap 4/5    Right Shoulder Flexion 4+/5    Right Shoulder ABduction 4+/5    Right Shoulder Internal Rotation 5/5    Right Shoulder  External Rotation 5/5    Left Shoulder Flexion 4+/5    Left Shoulder ABduction 4+/5    Left Shoulder Internal Rotation 5/5    Left Shoulder External Rotation 5/5      Special Tests   Other special tests DNF endurance test: 30secs                                      PT Short Term Goals - 01/26/21 2157       PT SHORT TERM GOAL #1   Title STGs= LTGs               PT Long Term Goals - 03/05/21 1449       PT LONG TERM GOAL #1   Title Pt will Ind in final HEP to address cervical ROM and posture to maintain achieved LOF    Status Achieved    Target Date 03/29/21      PT LONG TERM GOAL #2   Title Improve cervical rotation to 65d and side bending to 40d to allow pt check for traffic safely by turning head vs currently needing turn her  trunk as well.    Baseline see flowsheets    Status Achieved    Target Date 03/29/21      PT LONG TERM GOAL #3   Title Pt will be able demonstrate proper sitting posture and voice understanding of proper work station computer set up to promote decreased cervical pain    Status Achieved    Target Date 03/29/21      PT LONG TERM GOAL #4   Title Pt will report a 50% decrease in her neck pain re: intensity and/or frequency for improved QOL    Status Achieved    Target Date 03/29/21                   Plan - 03/05/21 1451     Clinical Impression Statement Patient presents to OPPT with no pain or any functional limitations. Pt reports significant reduction in symptoms in neck and shoulder and overall improved function. Improved cervical AROM in all directions with no increased pain. Pt able to achieve all long term goals and is pleased with their outcomes of OPPT. Therefore, patient is appropriate to be discharged at this time and encouraged to continue with HEP.    Personal Factors and Comorbidities Comorbidity 3+    Comorbidities Fibromuscular dysplagia, Hx of stoke, chronic low back pain     Stability/Clinical Decision Making --    Rehab Potential --    PT Frequency --    PT Duration --    PT Treatment/Interventions ADLs/Self Care Home Management;Therapeutic exercise;Manual techniques;Passive range of motion;Dry needling    PT Next Visit Plan --    PT Home Exercise Plan [id number]    Consulted and Agree with Plan of Care Patient             Patient will benefit from skilled therapeutic intervention in order to improve the following deficits and impairments:     Visit Diagnosis: No diagnosis found.     Problem List Patient Active Problem List   Diagnosis Date Noted   Ganglion cyst of dorsum of right wrist 01/25/2019   Acute medial meniscus tear of left knee 05/18/2018   Hamstring injury, left, initial encounter 02/02/2017   Hamstring injury, right, initial encounter 08/12/2016   Viral upper respiratory infection 03/27/2016   Trigger point of left shoulder region 07/02/2015   Abnormality of gait 02/13/2015   Lumbar radiculopathy, acute 12/04/2014   Arterial ischemic stroke ([place]) 08/29/2014   Fibromuscular dysplasia ([place]) 08/29/2014   Acute headache 08/23/2014   Trigger point of right shoulder region 08/23/2014   Neck pain 08/14/2014   Nonallopathic lesion of sacral region 05/01/2014   Hip flexor tightness 09/06/2013   Nonallopathic lesion of thoracic region 12/13/2012   Lumbar back pain 05/17/2012   Leg length discrepancy 05/17/2012   Nonallopathic lesion of lumbosacral region 05/17/2012  PHYSICAL THERAPY DISCHARGE SUMMARY  Visits from Start of Care: 5  Current functional level related to goals / functional outcomes: See goals above.    Remaining deficits: See impression.   Education / Equipment: NA   Patient agrees to discharge. Patient goals were met. Patient is being discharged due to meeting the stated rehab goals.   Glade Lloyd, [place] 03/05/21 5:07 PM   [facility] [address] [place], [place], [address] Phone: [phone]   Fax:  [phone]  Name: Rachel Golden MRN: [id number] Date of Birth: [birth year]

## 2021-03-12 ENCOUNTER — Ambulatory Visit: Payer: Managed Care, Other (non HMO)

## 2021-03-20 ENCOUNTER — Ambulatory Visit: Payer: Managed Care, Other (non HMO)

## 2021-04-16 ENCOUNTER — Ambulatory Visit: Payer: Managed Care, Other (non HMO) | Admitting: Family Medicine

## 2021-04-16 NOTE — Progress Notes (Signed)
[name] [address] [place] [address] Phone: [phone] Subjective:   [name], am serving as a scribe for Dr. Antoine Primas.This visit occurred during the SARS-CoV-2 public health emergency.  Safety protocols were in place, including screening questions prior to the visit, additional usage of staff PPE, and extensive cleaning of exam room while observing appropriate contact time as indicated for disinfecting solutions.  I'm seeing this patient by the request  of:  Jarrett Soho, PA-C  CC: Left shoulder and upper back pain follow-up, knee pain and foot pain  [id number]  Rachel Golden is a 56 y.o. female coming in with complaint of back and neck pain. OMT on 03/05/2021.  Past medical history is significant for fibromuscular dysplasia.  Patient states that she has been having pain over midfoot of L foot. Pain more at rest. Pain with PF. No pain with activity.  Starting to run more. Knee pain is minimal but has intermittent pain. Wants to know what best plan is for bracing knee. Goal is 10 miles a week with more trail running.  Nothing that stops her from any activity.  Just so looking to avoid getting any worse over the course of time.  Medications patient has been prescribed: None  Taking:         Reviewed prior external information including notes and imaging from previsou exam, outside providers and external EMR if available.   As well as notes that were available from care everywhere and other healthcare systems.  Past medical history, social, surgical and family history all reviewed in electronic medical record.  No pertanent information unless stated regarding to the chief complaint.   Past Medical History:  Diagnosis Date   Chronic back pain    Sleep apnea    Stroke (HCC)     No Known Allergies   Review of Systems:  No headache, visual changes, nausea, vomiting, diarrhea, constipation, dizziness, abdominal pain,  skin rash, fevers, chills, night sweats, weight loss, swollen lymph nodes, body aches, joint swelling, chest pain, shortness of breath, mood changes. POSITIVE muscle aches  Objective  Blood pressure 110/74, pulse 67, height 5' (1.524 m), weight 130 lb (59 kg), last menstrual period 06/03/2014, SpO2 98 %.   General: No apparent distress alert and oriented x3 mood and affect normal, dressed appropriately.  HEENT: Pupils equal, extraocular movements intact  Respiratory: Patient's speak in full sentences and does not appear short of breath  Cardiovascular: No lower extremity edema, non tender, no erythema  Low back exam does have some loss of lordosis.  Patient does have some tightness noted in the hip flexors bilaterally.  Mild worsening on the left side than patient's baseline. Foot exam does show the patient does have breakdown of the transverse arch left greater than right.  Bunion and bunionette formation noted.  Mild positive squeeze test.  Limited muscular skeletal ultrasound was performed and interpreted by Antoine Primas, M  Limited ultrasound of patient's left foot shows the patient does have some mild hypoechoic changes consistent with a neuroma in the first anterior digital space approximately near the midfoot. Impression: Early neuroma  Osteopathic findings  C6 flexed rotated and side bent left T3 extended rotated and side bent left inhaled rib T9 extended rotated and side bent left L2 flexed rotated and side bent right Sacrum right on right       Assessment and Plan:  Abnormality of gait Patient does have breakdown of the transverse arch of the foot.  Concerned that patient may be developing a neuroma.  Things such as injections at the moment.  Discussed over-the-counter orthotics, avoid being barefoot.  Worsening pain consider injection and gabapentin.  Follow-up again in 6 weeks  Lumbar back pain Patient is increase activity.  Continues to have some tightness in the  back.  Responded extremely well to osteopathic manipulation.  Follow-up again in 6 to 8 months  Interdigital neuroma of left foot Neuroma noted.  Discussed icing regimen and home exercises, discussed which activities to do which wants to avoid discussed over-the-counter orthotics.  Worsening pain consider injections.    Nonallopathic problems  Decision today to treat with OMT was based on Physical Exam  After verbal consent patient was treated with HVLA, ME, FPR techniques in cervical, rib, thoracic, lumbar, and sacral  areas  Patient tolerated the procedure well with improvement in symptoms  Patient given exercises, stretches and lifestyle modifications  See medications in patient instructions if given  Patient will follow up in 4-8 weeks      The above documentation has been reviewed and is accurate and complete Lyndal Pulley, DO        Note: This dictation was prepared with Dragon dictation along with smaller phrase technology. Any transcriptional errors that result from this process are unintentional.

## 2021-04-17 ENCOUNTER — Other Ambulatory Visit: Payer: Self-pay

## 2021-04-17 ENCOUNTER — Ambulatory Visit: Payer: Self-pay

## 2021-04-17 ENCOUNTER — Ambulatory Visit: Payer: Managed Care, Other (non HMO) | Admitting: Family Medicine

## 2021-04-17 VITALS — BP 110/74 | HR 67 | Ht 60.0 in | Wt 130.0 lb

## 2021-04-17 DIAGNOSIS — M79672 Pain in left foot: Secondary | ICD-10-CM | POA: Diagnosis not present

## 2021-04-17 DIAGNOSIS — M9904 Segmental and somatic dysfunction of sacral region: Secondary | ICD-10-CM | POA: Diagnosis not present

## 2021-04-17 DIAGNOSIS — R269 Unspecified abnormalities of gait and mobility: Secondary | ICD-10-CM

## 2021-04-17 DIAGNOSIS — M9902 Segmental and somatic dysfunction of thoracic region: Secondary | ICD-10-CM | POA: Diagnosis not present

## 2021-04-17 DIAGNOSIS — M545 Low back pain, unspecified: Secondary | ICD-10-CM | POA: Diagnosis not present

## 2021-04-17 DIAGNOSIS — G5782 Other specified mononeuropathies of left lower limb: Secondary | ICD-10-CM | POA: Diagnosis not present

## 2021-04-17 DIAGNOSIS — M9903 Segmental and somatic dysfunction of lumbar region: Secondary | ICD-10-CM

## 2021-04-17 NOTE — Assessment & Plan Note (Signed)
Neuroma noted.  Discussed icing regimen and home exercises, discussed which activities to do which wants to avoid discussed over-the-counter orthotics.  Worsening pain consider injections.

## 2021-04-17 NOTE — Assessment & Plan Note (Signed)
Patient does have breakdown of the transverse arch of the foot.  Concerned that patient may be developing a neuroma.  Things such as injections at the moment.  Discussed over-the-counter orthotics, avoid being barefoot.  Worsening pain consider injection and gabapentin.  Follow-up again in 6 weeks

## 2021-04-17 NOTE — Patient Instructions (Signed)
Transverse arch Voltaren gel Avoid being barefoot Spenco Total Support Orthotics See me again in 6 weeks

## 2021-04-17 NOTE — Assessment & Plan Note (Signed)
Patient is increase activity.  Continues to have some tightness in the back.  Responded extremely well to osteopathic manipulation.  Follow-up again in 6 to 8 months

## 2021-05-29 ENCOUNTER — Ambulatory Visit: Payer: Managed Care, Other (non HMO) | Admitting: Family Medicine

## 2021-06-25 NOTE — Progress Notes (Signed)
?Rachel Golden D.O. ?[facility] ?[address] [place] [address] ?Phone: [phone] ?Subjective:   ?I, Rachel Golden, am serving as a Neurosurgeon for Dr. Antoine Primas. ?This visit occurred during the SARS-CoV-2 public health emergency.  Safety protocols were in place, including screening questions prior to the visit, additional usage of staff PPE, and extensive cleaning of exam room while observing appropriate contact time as indicated for disinfecting solutions.  ? ?I'm seeing this patient by the request  of:  Jarrett Soho, PA-C ? ?CC: Foot pain follow-up, back pain follow-up ? ?[id number]  ?04/17/2021 ?OMT ?Neuroma noted.  Discussed icing regimen and home exercises, discussed which activities to do which wants to avoid discussed over-the-counter orthotics.  Worsening pain consider injections. ? ?Patient does have breakdown of the transverse arch of the foot.  Concerned that patient may be developing a neuroma.  Things such as injections at the moment.  Discussed over-the-counter orthotics, avoid being barefoot.  Worsening pain consider injection and gabapentin.  Follow-up again in 6 weeks ? ?Updated 06/26/2021 ?Rachel Golden is a 56 y.o. female coming in with complaint of foot pain. Also OMT ? ? ? ?  ? ?Past Medical History:  ?Diagnosis Date  ? Chronic back pain   ? Sleep apnea   ? Stroke [facility])   ? ?Past Surgical History:  ?Procedure Laterality Date  ? NASAL SEPTUM SURGERY    ? ?Social History  ? ?Socioeconomic History  ? Marital status: Married  ?  Spouse name: Not on file  ? Number of children: 2  ? Years of education: 34  ? Highest education level: Not on file  ?Occupational History  ? Not on file  ?Tobacco Use  ? Smoking status: Never  ? Smokeless tobacco: Never  ?Vaping Use  ? Vaping Use: Never used  ?Substance and Sexual Activity  ? Alcohol use: Yes  ?  Alcohol/week: 0.0 standard drinks  ?  Comment: 3-4 drinks per week, on occasion  ? Drug use: No  ? Sexual activity: Not on file  ?Other  Topics Concern  ? Not on file  ?Social History Narrative  ? Lives with husband and 2 children in a 2 story home.    ? Works as an Airline pilot part time.    ? Education: BS degree.  ? Right handed  ? ?Social Determinants of Health  ? ?Financial Resource Strain: Not on file  ?Food Insecurity: Not on file  ?Transportation Needs: Not on file  ?Physical Activity: Not on file  ?Stress: Not on file  ?Social Connections: Not on file  ? ?No Known Allergies ?Family History  ?Problem Relation Age of Onset  ? Heart murmur Mother   ?     Living, 79  ? Alzheimer's disease Father   ?     Living, 80  ? Vascular Disease Brother   ? Healthy Son   ? Healthy Daughter   ? ? ? ? ? ?Current Outpatient Medications (Analgesics):  ?  aspirin EC 81 MG tablet, Take 1 tablet (81 mg total) by mouth daily. ? ? ?Current Outpatient Medications (Other):  ?  Cholecalciferol (VITAMIN D3) 1000 units CAPS, Take 2,000 Units by mouth. ?  Glucosamine Sulfate (SYNOVACIN PO), Glucosamine ?  Probiotic Product (PROBIOTIC DAILY PO), Take by mouth. ? ? ?Reviewed prior external information including notes and imaging from  ?primary care provider ?As well as notes that were available from care everywhere and other healthcare systems. ? ?Past medical history, social, surgical and family history all  reviewed in electronic medical record.  No pertanent information unless stated regarding to the chief complaint.  ? ?Review of Systems: ? No headache, visual changes, nausea, vomiting, diarrhea, constipation, dizziness, abdominal pain, skin rash, fevers, chills, night sweats, weight loss, swollen lymph nodes, body aches, joint swelling, chest pain, shortness of breath, mood changes. POSITIVE muscle aches ? ?Objective  ?Pulse 74, height 5' (1.524 m), weight 129 lb (58.5 kg), last menstrual period 06/03/2014, SpO2 97 %. ?  ?General: No apparent distress alert and oriented x3 mood and affect normal, dressed appropriately.  ?HEENT: Pupils equal, extraocular movements intact   ?Respiratory: Patient's speak in full sentences and does not appear short of breath  ?Cardiovascular: No lower extremity edema, non tender, no erythema  ?Gait normal with good balance and coordination.  ?MSK: Foot exam shows the patient does have some mild loss of lordosis.  Negative squeeze test.  Minimal pain in the midfoot. ? ?Back exam does show the patient does have tightness noted in the paraspinal musculature left greater than right.  Mild tightness noted with FABER test left greater than right. ? ? ?Osteopathic findings ?T6 extended rotated and side bent left ?L5 flexed rotated and side bent left ?Sacrum left on left ?  ?Impression and Recommendations:  ?  ? ?The above documentation has been reviewed and is accurate and complete [name], DO ? ? ? ?

## 2021-06-26 ENCOUNTER — Other Ambulatory Visit: Payer: Self-pay

## 2021-06-26 ENCOUNTER — Ambulatory Visit: Payer: Managed Care, Other (non HMO) | Admitting: Family Medicine

## 2021-06-26 ENCOUNTER — Encounter: Payer: Self-pay | Admitting: Neurology

## 2021-06-26 VITALS — HR 74 | Ht 60.0 in | Wt 129.0 lb

## 2021-06-26 DIAGNOSIS — M545 Low back pain, unspecified: Secondary | ICD-10-CM

## 2021-06-26 DIAGNOSIS — M9903 Segmental and somatic dysfunction of lumbar region: Secondary | ICD-10-CM | POA: Diagnosis not present

## 2021-06-26 DIAGNOSIS — M9902 Segmental and somatic dysfunction of thoracic region: Secondary | ICD-10-CM | POA: Diagnosis not present

## 2021-06-26 DIAGNOSIS — M9904 Segmental and somatic dysfunction of sacral region: Secondary | ICD-10-CM

## 2021-06-26 NOTE — Assessment & Plan Note (Signed)
Continues to respond well to osteopathic manipulation.  Patient has done relatively well overall.  No significant changes in management.  Follow-up again in 6 to 8 weeks. ?

## 2021-06-26 NOTE — Patient Instructions (Signed)
Best you have been in a  while ?Voltaren 2x a day for one more week then as needed ?See me in 2-3 months ?

## 2021-07-28 ENCOUNTER — Ambulatory Visit: Payer: Managed Care, Other (non HMO) | Admitting: Neurology

## 2021-10-08 ENCOUNTER — Ambulatory Visit (INDEPENDENT_AMBULATORY_CARE_PROVIDER_SITE_OTHER): Payer: Managed Care, Other (non HMO)

## 2021-10-08 ENCOUNTER — Ambulatory Visit (INDEPENDENT_AMBULATORY_CARE_PROVIDER_SITE_OTHER): Payer: Managed Care, Other (non HMO) | Admitting: Sports Medicine

## 2021-10-08 ENCOUNTER — Ambulatory Visit: Payer: Managed Care, Other (non HMO) | Admitting: Neurology

## 2021-10-08 VITALS — BP 100/80 | HR 76 | Ht 60.0 in | Wt 129.0 lb

## 2021-10-08 DIAGNOSIS — S76311A Strain of muscle, fascia and tendon of the posterior muscle group at thigh level, right thigh, initial encounter: Secondary | ICD-10-CM

## 2021-10-08 DIAGNOSIS — M25561 Pain in right knee: Secondary | ICD-10-CM

## 2021-10-08 MED ORDER — MELOXICAM 7.5 MG PO TABS: 7.5000 mg | ORAL_TABLET | Freq: Every day | ORAL | 0 refills | Status: DC

## 2021-10-08 NOTE — Patient Instructions (Addendum)
Good to see you  - Start meloxicam 7.5  mg daily 1-2 weeks.  May use remaining meloxicam as needed once daily for pain control.  Do not to use additional NSAIDs while taking meloxicam.  May use Tylenol [phone] mg 2 to 3 times a day for breakthrough pain. Hamstring HEP  As needed follow up if no improvement 2-4 week follow up

## 2021-10-08 NOTE — Progress Notes (Signed)
    Rachel Golden D.[facility] [address] [place] [address] Phone: [phone]   Assessment and Plan:     1. Acute pain of right knee 2. Strain of right hamstring muscle, initial encounter -Acute, uncomplicated, initial sports medicine visit - Consistent with lateral distal hamstring strain likely from patient's 5K race - Start HEP focusing on hamstring stretching before and after physical activity - Start meloxicam 7.5 mg daily x 1-2 weeks. May use remaining meloxicam as needed once daily for pain control.  Do not to use additional NSAIDs while taking meloxicam.  May use Tylenol [phone] mg 2 to 3 times a day for breakthrough pain.  -X-ray obtained in clinic.  My interpretation: No acute fracture or dislocation  Pertinent previous records reviewed include none pertinent   Follow Up: As needed if no improvement or worsening of symptoms   Subjective:   I, Rachel Golden, am serving as a Neurosurgeon for Doctor Richardean Sale  Chief Complaint: right knee pain   HPI:   10/08/21 Patient is a 56 year old female complaining of right knee pain. Patient states that she felt a twinge last week is an avid runner ran a road race on Saturday came in second place Sunday was stiff, Monday knee didn't feel right took it easy tried to listen to her body, has hamstring stiffness , no numbness or tingling,is able to squat and get back up its a strain to get back up , hx of torn meniscus on left   Relevant Historical Information: None pertinent  Additional pertinent review of systems negative.   Current Outpatient Medications:    aspirin EC 81 MG tablet, Take 1 tablet (81 mg total) by mouth daily., Disp: , Rfl:    Cholecalciferol (VITAMIN D3) 1000 units CAPS, Take 2,000 Units by mouth., Disp: , Rfl:    Glucosamine Sulfate (SYNOVACIN PO), Glucosamine, Disp: , Rfl:    meloxicam (MOBIC) 7.5 MG tablet, Take 1 tablet (7.5 mg total) by mouth daily., Disp: 21  tablet, Rfl: 0   Probiotic Product (PROBIOTIC DAILY PO), Take by mouth., Disp: , Rfl:    Objective:     Vitals:   10/08/21 1421  BP: 100/80  Pulse: 76  SpO2: 98%  Weight: 129 lb (58.5 kg)  Height: 5' (1.524 m)      Body mass index is 25.19 kg/m.    Physical Exam:    General:  awake, alert oriented, no acute distress nontoxic Skin: no suspicious lesions or rashes Neuro:sensation intact, no deficits, strength 5/5 with no deficits, no atrophy, normal muscle tone Psych: No signs of anxiety, depression or other mood disorder  Right knee: No swelling No deformity Neg fluid wave, joint milking ROM Flex 110 , Ext 0  TTP lateral hamstring tendon Tenderness along hamstring lateral tendon with resisted knee flexion NTTP over the quad tendon, medial fem condyle, lat fem condyle, patella, plica, patella tendon, tibial tuberostiy, fibular head, posterior fossa, pes anserine bursa, gerdy's tubercle, medial jt line, lateral jt line Neg anterior and posterior drawer Neg lachman Neg sag sign Negative varus stress Negative valgus stress Negative McMurray    Gait normal    Electronically signed by:  Rachel Golden D.[facility] 2:48 PM 10/08/21

## 2021-10-13 ENCOUNTER — Ambulatory Visit: Payer: Managed Care, Other (non HMO) | Admitting: Sports Medicine

## 2021-10-30 NOTE — Progress Notes (Signed)
[name] [address] [place] [address] Phone: [phone] Subjective:   Rachel Golden, am serving as a scribe for Dr. Antoine Primas.  I'm seeing this patient by the request  of:  Jarrett Soho, PA-C  CC:   [id number]  10/08/2021 1. Acute pain of right knee 2. Strain of right hamstring muscle, initial encounter -Acute, uncomplicated, initial sports medicine visit - Consistent with lateral distal hamstring strain likely from patient's 5K race - Start HEP focusing on hamstring stretching before and after physical activity - Start meloxicam 7.5 mg daily x 1-2 weeks. May use remaining meloxicam as needed once daily for pain control.  Do not to use additional NSAIDs while taking meloxicam.  May use Tylenol [phone] mg 2 to 3 times a day for breakthrough pain.  -X-ray obtained in clinic.  My interpretation: No acute fracture or dislocation   Pertinent previous records reviewed include none pertinent   Follow Up: As needed if no improvement or worsening of symptoms  Updated 10/31/2021 Karisha Marlin is a 56 y.o. female coming in with complaint of right knee pain. Saw Dr. Jean Rosenthal. Pain in posterior lateral knee since the last week in June. Still able run. Feels a pulling sensation. Able to squat but at top of motion she will have this pulling sensation. If she rolls over area she said it is tender. Pressure from thigh compression is helping her pain.   Should she continue to brace for L knee.        Past Medical History:  Diagnosis Date   Chronic back pain    Sleep apnea    Stroke [facility])    Past Surgical History:  Procedure Laterality Date   NASAL SEPTUM SURGERY     Social History   Socioeconomic History   Marital status: Married    Spouse name: Not on file   Number of children: 2   Years of education: 31   Highest education level: Not on file  Occupational History   Not on file  Tobacco Use   Smoking status: Never    Smokeless tobacco: Never  Vaping Use   Vaping Use: Never used  Substance and Sexual Activity   Alcohol use: Yes    Alcohol/week: 0.0 standard drinks of alcohol    Comment: 3-4 drinks per week, on occasion   Drug use: No   Sexual activity: Not on file  Other Topics Concern   Not on file  Social History Narrative   Lives with husband and 2 children in a 2 story home.     Works as an Airline pilot part time.     Education: BS degree.   Right handed   Social Determinants of Health   Financial Resource Strain: Not on file  Food Insecurity: Not on file  Transportation Needs: Not on file  Physical Activity: Not on file  Stress: Not on file  Social Connections: Not on file   No Known Allergies Family History  Problem Relation Age of Onset   Heart murmur Mother        Living, 72   Alzheimer's disease Father        Living, 28   Vascular Disease Brother    Healthy Son    Healthy Daughter        Current Outpatient Medications (Analgesics):    aspirin EC 81 MG tablet, Take 1 tablet (81 mg total) by mouth daily.   meloxicam (MOBIC) 7.5 MG tablet, Take 1 tablet (7.5 mg  total) by mouth daily.   Current Outpatient Medications (Other):    Cholecalciferol (VITAMIN D3) 1000 units CAPS, Take 2,000 Units by mouth.   Glucosamine Sulfate (SYNOVACIN PO), Glucosamine   Probiotic Product (PROBIOTIC DAILY PO), Take by mouth.   Reviewed prior external information including notes and imaging from  primary care provider As well as notes that were available from care everywhere and other healthcare systems.  Past medical history, social, surgical and family history all reviewed in electronic medical record.  No pertanent information unless stated regarding to the chief complaint.   Review of Systems:  No headache, visual changes, nausea, vomiting, diarrhea, constipation, dizziness, abdominal pain, skin rash, fevers, chills, night sweats, weight loss, swollen lymph nodes, body aches, joint  swelling, chest pain, shortness of breath, mood changes. POSITIVE muscle aches  Objective  Blood pressure 118/84, pulse 80, height 5' (1.524 m), weight 126 lb (57.2 kg), last menstrual period 06/03/2014, SpO2 98 %.   General: No apparent distress alert and oriented x3 mood and affect normal, dressed appropriately.  HEENT: Pupils equal, extraocular movements intact  Respiratory: Patient's speak in full sentences and does not appear short of breath  Cardiovascular: No lower extremity edema, non tender, no erythema  Right knee trace effusion noted.  More tenderness over the medial posterior aspect of the right knee.  Mild positive McMurray's noted.  Full range of motion though noted.  Limited muscular skeletal ultrasound was performed and interpreted by Antoine Primas, M Limited ultrasound shows the patient does have a abnormality noted to the posterior medial meniscus.  Mild displacement noted. Impression acute moderately displaced meniscal tear    Impression and Recommendations:    The above documentation has been reviewed and is accurate and complete [name], DO

## 2021-10-31 ENCOUNTER — Encounter: Payer: Self-pay | Admitting: Family Medicine

## 2021-10-31 ENCOUNTER — Ambulatory Visit: Payer: Managed Care, Other (non HMO) | Admitting: Family Medicine

## 2021-10-31 ENCOUNTER — Ambulatory Visit: Payer: Self-pay

## 2021-10-31 VITALS — BP 118/84 | HR 80 | Ht 60.0 in | Wt 126.0 lb

## 2021-10-31 DIAGNOSIS — S83241A Other tear of medial meniscus, current injury, right knee, initial encounter: Secondary | ICD-10-CM

## 2021-10-31 DIAGNOSIS — M25561 Pain in right knee: Secondary | ICD-10-CM

## 2021-10-31 NOTE — Assessment & Plan Note (Signed)
Patient given injection today.  No acute tear noted.  Patient did have increasing vascularity in the area.  Patient has mild synovitis.  Injection given today.  Discussed icing regimen and home exercises otherwise.  Patient able to do things in a straight line for low impact exercises.  Follow-up with me again in 6 weeks

## 2021-10-31 NOTE — Patient Instructions (Addendum)
Injected R knee today Should heel quickly No twisting motions Ok to do everything else No brace needed See me in 6-8 weeks

## 2021-11-03 ENCOUNTER — Ambulatory Visit: Payer: Managed Care, Other (non HMO) | Admitting: Family Medicine

## 2021-11-03 IMAGING — US US RENAL ARTERY STENOSIS
1 series · 13 of 25 positions shown · non-contrast
Comparison: None.

CLINICAL DATA: EVAL OF FIBRO DYSPLASIA AT THE REQUEST OF NEUROLOGY

EXAM:
RENAL/URINARY TRACT ULTRASOUND
RENAL DUPLEX DOPPLER ULTRASOUND

[Series 1: us renal artery stenosis · 0.23mm/px · 13 of 100 slices shown]
[im 1/100]
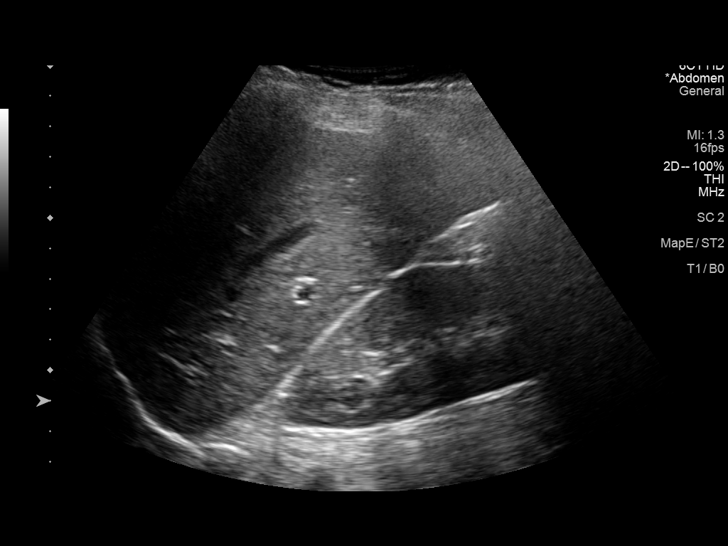
[im 9/100]
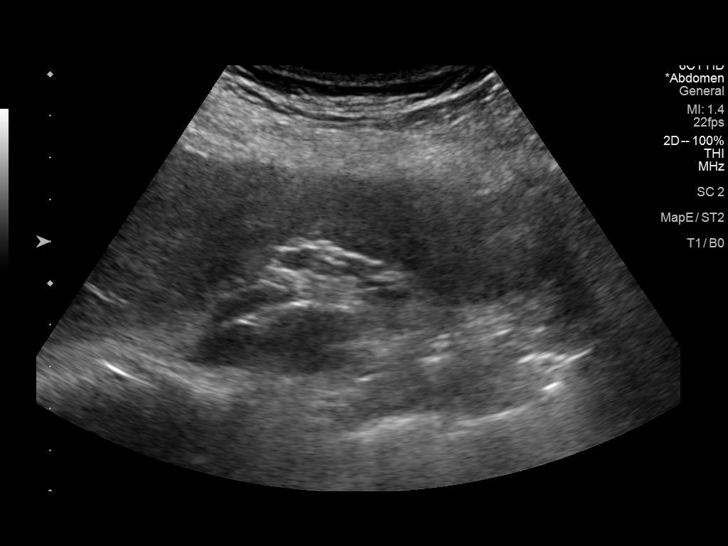
[im 17/100]
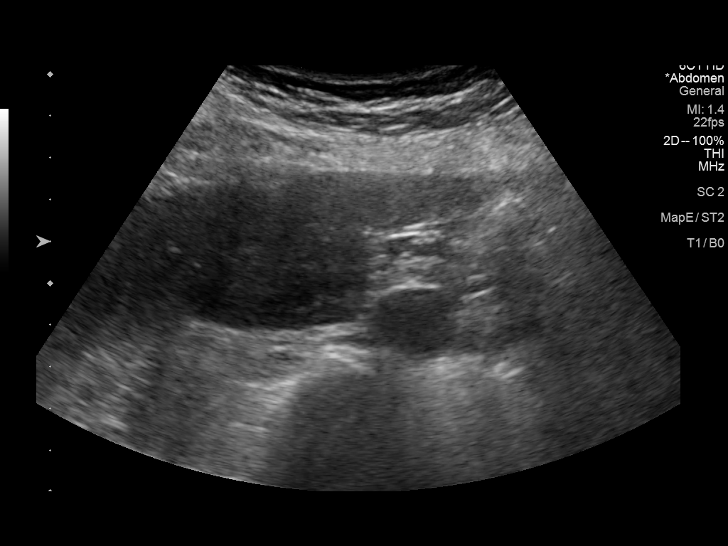
[im 25/100]
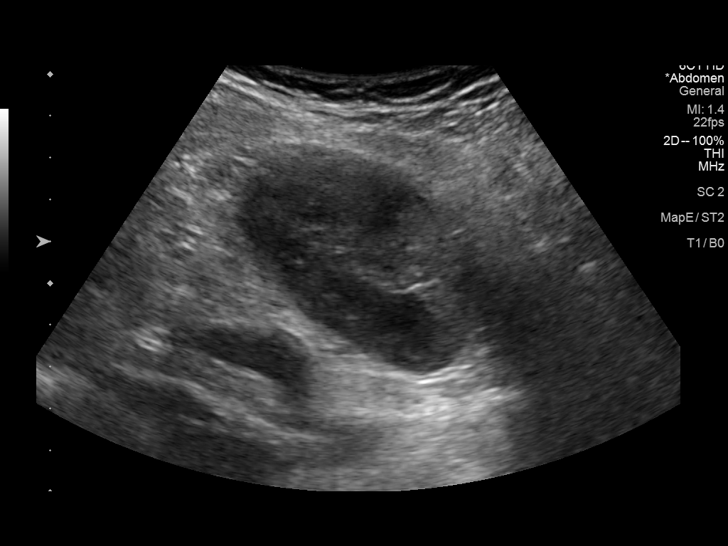
[im 34/100]
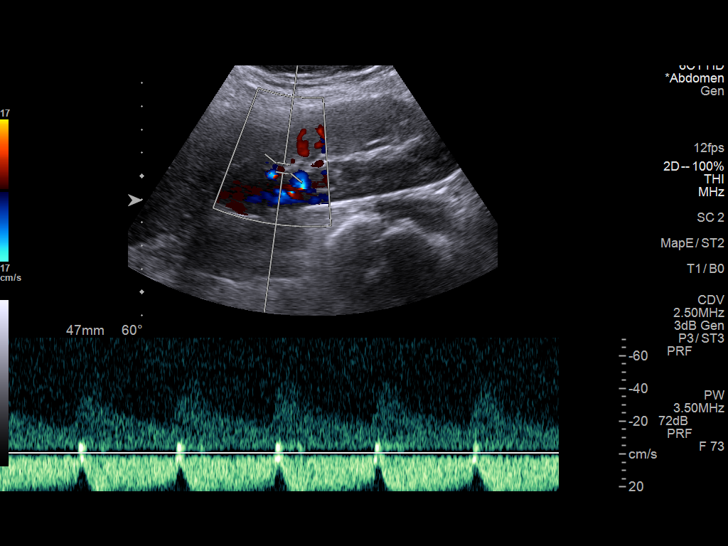
[im 42/100]
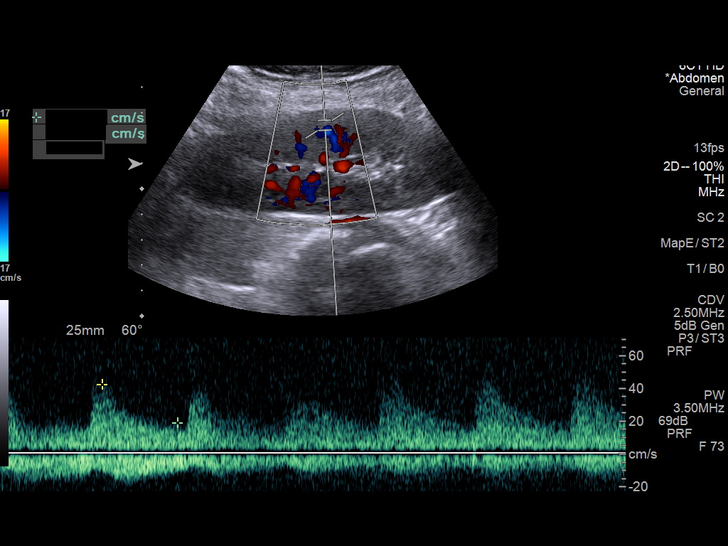
[im 50/100]
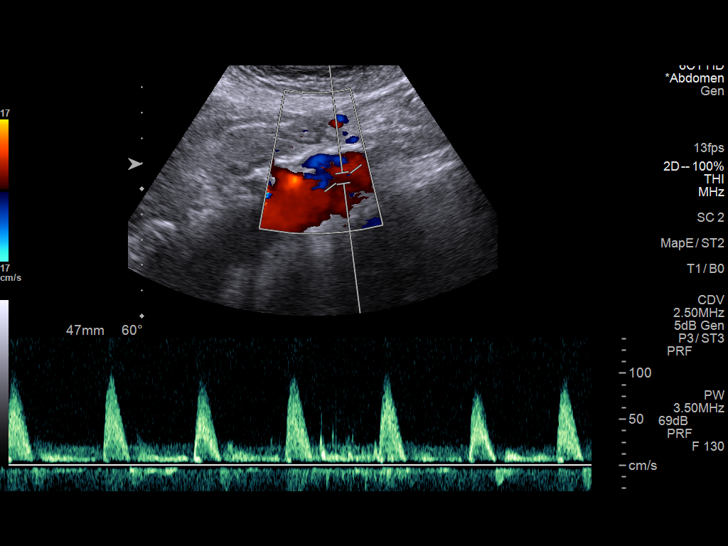
[im 58/100]
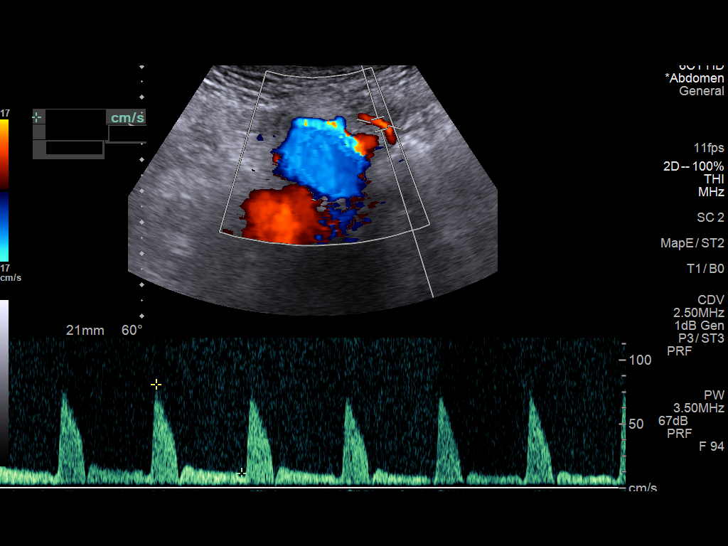
[im 67/100]
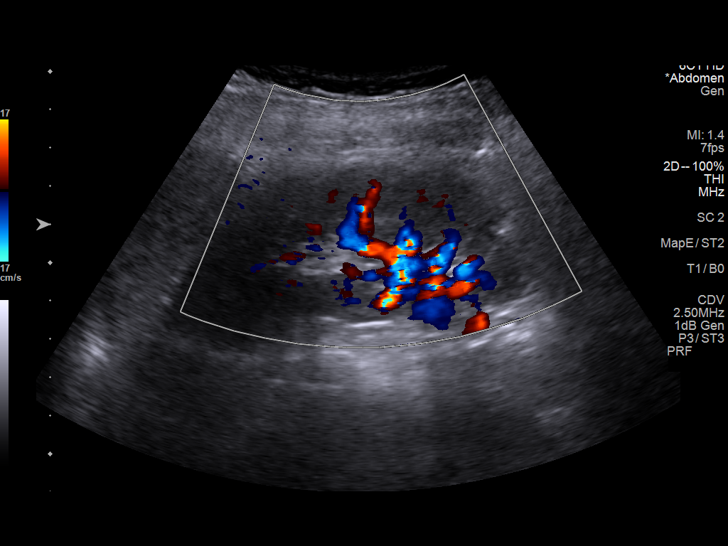
[im 75/100]
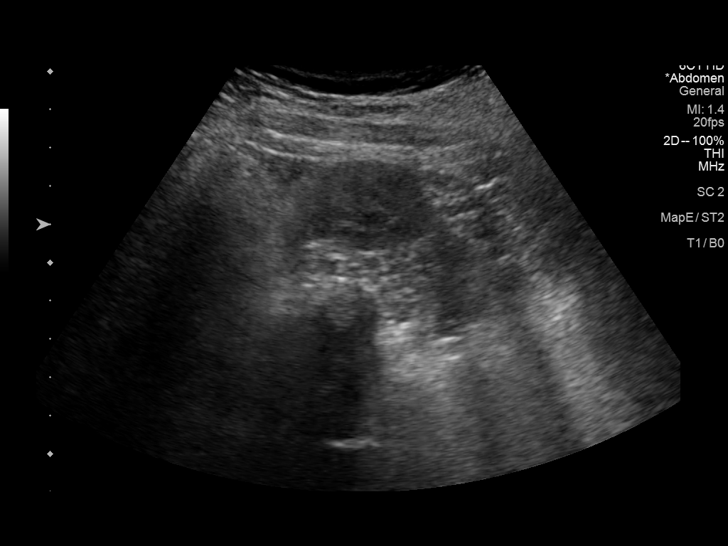
[im 83/100]
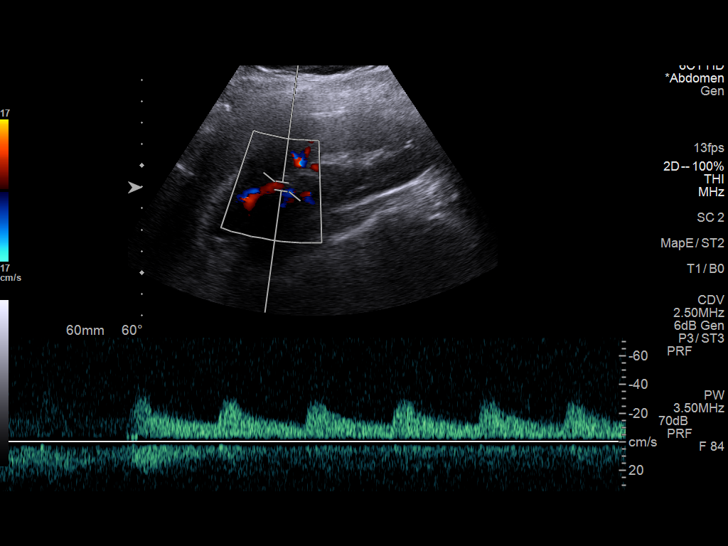
[im 91/100]
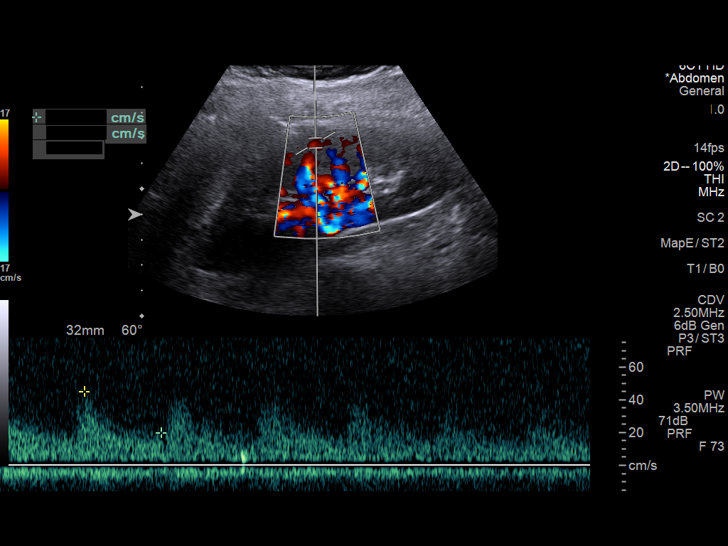
[im 100/100]
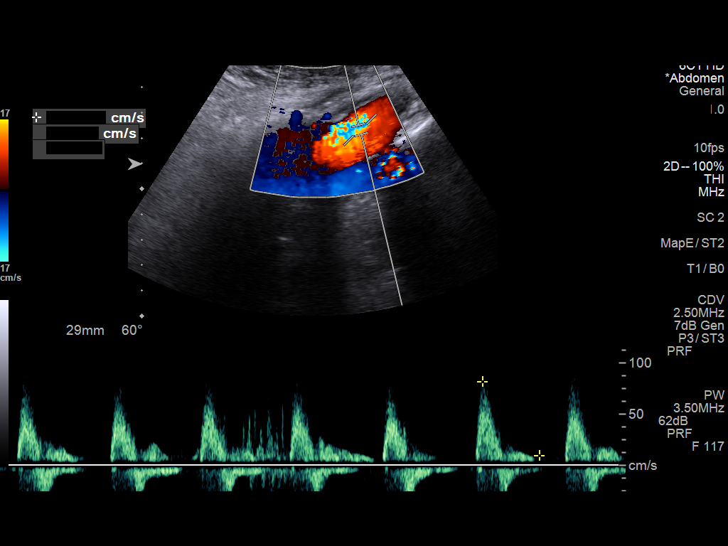

[13 of 25 positions shown; findings below may reference images not displayed]

FINDINGS: Right Kidney:

Length: 10.8. Echogenicity within normal limits. No mass or
hydronephrosis visualized.

Left Kidney:

Length: 11.0. Echogenicity within normal limits. No mass or
hydronephrosis visualized.

Bladder:  Within normal limits.

RENAL DUPLEX ULTRASOUND

Right Renal Artery Velocities:

Origin:  116 cm/sec

Mid:  81 cm/sec

Hilum:  92 cm/sec

Interlobar:  46 cm/sec

Arcuate:  43 cm/sec

Left Renal Artery Velocities:

Origin:  81 cm/sec

Mid:  97 cm/sec

Hilum:  115 cm/sec

Interlobar:  43 cm/sec

Arcuate:  45 cm/sec

Aortic Velocity:  104 cm/sec

Right Renal-Aortic Ratios:

Origin:

Mid:

Hilum:

Interlobar:

Arcuate:

Left Renal-Aortic Ratios:

Origin:

Mid:

Hilum:

Interlobar:

Arcuate:

Bilateral renal veins are patent.
IMPRESSION: Normal sonographic appearance and vascular exam of the kidneys. No
findings to suggest renal artery stenosis. Note that the renal
arterial wall is not typically well characterized on sonographic
exam. For more definitive evaluation for fibromuscular dysplasia,
recommend CTA abdomen or catheter angiography.

## 2021-11-11 ENCOUNTER — Other Ambulatory Visit: Payer: Self-pay

## 2021-11-11 ENCOUNTER — Telehealth: Payer: Self-pay | Admitting: Family Medicine

## 2021-11-11 MED ORDER — MELOXICAM 7.5 MG PO TABS: 7.5000 mg | ORAL_TABLET | Freq: Every day | ORAL | 0 refills | Status: DC

## 2021-11-11 NOTE — Telephone Encounter (Signed)
Rx filled. Patient notified.  

## 2021-11-11 NOTE — Telephone Encounter (Signed)
Patient called asking for a refill on meloxicam (MOBIC) 7.5 MG tablet. It was previously prescribed by Dr Jean Rosenthal. She was scheduled to see Dr Katrinka Blazing tomorrow but was exposed to Covid and wanted to cancel her appointment to be safe. She said that her lower back is feeling some better and is going to hold off for now.  Please advise.

## 2021-11-12 ENCOUNTER — Ambulatory Visit: Payer: Managed Care, Other (non HMO) | Admitting: Family Medicine

## 2021-11-14 ENCOUNTER — Ambulatory Visit: Payer: Managed Care, Other (non HMO) | Admitting: Neurology

## 2021-12-04 NOTE — Progress Notes (Signed)
[name] [address] [place] [address] Phone: [phone] Subjective:   INadine Counts, am serving as a scribe for Dr. Antoine Primas.  I'm seeing this patient by the request  of:  Jarrett Soho, PA-C  CC: right knee pain and back pain   [id number]  10/31/2021 Patient given injection today.  No acute tear noted.  Patient did have increasing vascularity in the area.  Patient has mild synovitis.  Injection given today.  Discussed icing regimen and home exercises otherwise.  Patient able to do things in a straight line for low impact exercises.  Follow-up with me again in 6 weeks  Update 12/12/2021 Rachel Golden is a 56 y.o. female coming in with complaint of R knee pain. Patient states doing well. She said when she does feel pain she rest for a day or two and feels fine. She is getting a pinching feeling on the right shoulder blade. Would like to be manipulated.      Past Medical History:  Diagnosis Date   Chronic back pain    Sleep apnea    Stroke [facility])    Past Surgical History:  Procedure Laterality Date   NASAL SEPTUM SURGERY     Social History   Socioeconomic History   Marital status: Married    Spouse name: Not on file   Number of children: 2   Years of education: 38   Highest education level: Not on file  Occupational History   Not on file  Tobacco Use   Smoking status: Never   Smokeless tobacco: Never  Vaping Use   Vaping Use: Never used  Substance and Sexual Activity   Alcohol use: Yes    Alcohol/week: 0.0 standard drinks of alcohol    Comment: 3-4 drinks per week, on occasion   Drug use: No   Sexual activity: Not on file  Other Topics Concern   Not on file  Social History Narrative   Lives with husband and 2 children in a 2 story home.     Works as an Airline pilot part time.     Education: BS degree.   Right handed   Social Determinants of Health   Financial Resource Strain: Not on file  Food Insecurity:  Not on file  Transportation Needs: Not on file  Physical Activity: Not on file  Stress: Not on file  Social Connections: Not on file   No Known Allergies Family History  Problem Relation Age of Onset   Heart murmur Mother        Living, 73   Alzheimer's disease Father        Living, 61   Vascular Disease Brother    Healthy Son    Healthy Daughter        Current Outpatient Medications (Analgesics):    aspirin EC 81 MG tablet, Take 1 tablet (81 mg total) by mouth daily.   meloxicam (MOBIC) 7.5 MG tablet, Take 1 tablet (7.5 mg total) by mouth daily.   Current Outpatient Medications (Other):    Cholecalciferol (VITAMIN D3) 1000 units CAPS, Take 2,000 Units by mouth.   Glucosamine Sulfate (SYNOVACIN PO), Glucosamine   Probiotic Product (PROBIOTIC DAILY PO), Take by mouth.   Reviewed prior external information including notes and imaging from  primary care provider As well as notes that were available from care everywhere and other healthcare systems.  Past medical history, social, surgical and family history all reviewed in electronic medical record.  No pertanent information  unless stated regarding to the chief complaint.   Review of Systems:  No headache, visual changes, nausea, vomiting, diarrhea, constipation, dizziness, abdominal pain, skin rash, fevers, chills, night sweats, weight loss, swollen lymph nodes, body aches, joint swelling, chest pain, shortness of breath, mood changes. POSITIVE muscle aches  Objective  Blood pressure 110/80, pulse 91, height 5' (1.524 m), weight 124 lb (56.2 kg), last menstrual period 06/03/2014, SpO2 97 %.   General: No apparent distress alert and oriented x3 mood and affect normal, dressed appropriately.  HEENT: Pupils equal, extraocular movements intact  Respiratory: Patient's speak in full sentences and does not appear short of breath  Cardiovascular: No lower extremity edema, non tender, no erythema  Right knee exam does have some  tenderness to palpation in the musculature on the medial aspect of the knee.  Fullness noted in the popliteal area.  No significant tenderness noted with positive grind test or McMurray's.  Lacks last 5 degrees of flexion of the knee  Limited muscular skeletal ultrasound was performed and interpreted by Antoine Primas, M  Limited ultrasound shows the patient's medial meniscus still has tearing noted but no significant displacement noted.  No significant hypoechoic changes or increasing in the synovitis that was seen previously in the patellofemoral joint.  Large Baker's cyst noted is noted in the popliteal fossa. Impression: Interval improvement with Baker's cyst formation   Procedure: Real-time Ultrasound Guided Injection of right knee Device: GE Logiq Q7 Ultrasound guided injection is preferred based studies that show increased duration, increased effect, greater accuracy, decreased procedural pain, increased response rate, and decreased cost with ultrasound guided versus blind injection.  Verbal informed consent obtained.  Time-out conducted.  Noted no overlying erythema, induration, or other signs of local infection.  Skin prepped in a sterile fashion.  Local anesthesia: Topical Ethyl chloride.  With sterile technique and under real time ultrasound guidance: With a 22-gauge 2 inch needle patient was injected with 4 cc of 0.5% Marcaine and aspirated 24 cc of straw-colored fluid then injected with 1 cc of Kenalog 40 mg/dL. This was from a posterior approach.  Completed without difficulty  Pain immediately resolved suggesting accurate placement of the medication.  Advised to call if fevers/chills, erythema, induration, drainage, or persistent bleeding.  Images permanently stored and available for review in the ultrasound unit.  Impression: Technically successful ultrasound guided injection.   Osteopathic findings  T5 extended rotated and side bent right inhaled third rib T6 extended  rotated and side bent left L1 flexed rotated and side bent right Sacrum right on right    Impression and Recommendations:     The above documentation has been reviewed and is accurate and complete [name], DO

## 2021-12-12 ENCOUNTER — Ambulatory Visit: Payer: Self-pay

## 2021-12-12 ENCOUNTER — Ambulatory Visit: Payer: Managed Care, Other (non HMO) | Admitting: Family Medicine

## 2021-12-12 VITALS — BP 110/80 | HR 91 | Ht 60.0 in | Wt 124.0 lb

## 2021-12-12 DIAGNOSIS — M9902 Segmental and somatic dysfunction of thoracic region: Secondary | ICD-10-CM | POA: Diagnosis not present

## 2021-12-12 DIAGNOSIS — M9904 Segmental and somatic dysfunction of sacral region: Secondary | ICD-10-CM | POA: Diagnosis not present

## 2021-12-12 DIAGNOSIS — S83241A Other tear of medial meniscus, current injury, right knee, initial encounter: Secondary | ICD-10-CM | POA: Diagnosis not present

## 2021-12-12 DIAGNOSIS — M9903 Segmental and somatic dysfunction of lumbar region: Secondary | ICD-10-CM

## 2021-12-12 DIAGNOSIS — M999 Biomechanical lesion, unspecified: Secondary | ICD-10-CM

## 2021-12-12 DIAGNOSIS — M2550 Pain in unspecified joint: Secondary | ICD-10-CM

## 2021-12-12 DIAGNOSIS — M25561 Pain in right knee: Secondary | ICD-10-CM

## 2021-12-12 DIAGNOSIS — M545 Low back pain, unspecified: Secondary | ICD-10-CM | POA: Diagnosis not present

## 2021-12-12 NOTE — Assessment & Plan Note (Signed)

## 2021-12-12 NOTE — Patient Instructions (Addendum)
Drained cyst today Good to see you! Use compression with exercise Meloxicam today

## 2021-12-12 NOTE — Assessment & Plan Note (Signed)
Mild increase in tightness secondary to the patient's discomfort and pain and probably compensating for some of the knee and the change in her gait.  Hopefully patient is to continue to increase activity.  Discussed icing regimen and home exercises.  We will follow-up again in 4 to 8 weeks otherwise.

## 2021-12-12 NOTE — Assessment & Plan Note (Signed)
Aspiration of baker cyst, discussed HEP  Discussed compression as well.  Patient should do well with conservative therapy.  Does have the meloxicam and can use it in 3 to 5-day burst.  Follow-up again in 6 to 8 weeks

## 2021-12-14 LAB — SYNOVIAL FLUID ANALYSIS, COMPLETE
Basophils, %: 0 %
Eosinophils-Synovial: 0 % (ref 0–2)
Lymphocytes-Synovial Fld: 50 % (ref 0–74)
Monocyte/Macrophage: 44 % (ref 0–69)
Neutrophil, Synovial: 6 % (ref 0–24)
Synoviocytes, %: 0 % (ref 0–15)
WBC, Synovial: 510 cells/uL — ABNORMAL HIGH (ref <150)

## 2021-12-16 NOTE — Progress Notes (Signed)
[name] [address] [place] [address] Phone: [phone] Subjective:   INadine Counts, am serving as a scribe for Dr. Antoine Primas.  I'm seeing this patient by the request  of:  Jarrett Soho, PA-C  CC: left shoulder pain   [id number]  Rachel Golden is a 56 y.o. female coming in with complaint of L shoulder pain. Drained cyst in R knee on 9/8. Patient states sharp pain in left shoulder blade. Says that there is a knot. Tender over that area. Radiates pain up neck when turning head to left. Little bit of something with up and down head movement. Also had question about right knee area, posterior side.       Past Medical History:  Diagnosis Date   Chronic back pain    Sleep apnea    Stroke [facility])    Past Surgical History:  Procedure Laterality Date   NASAL SEPTUM SURGERY     Social History   Socioeconomic History   Marital status: Married    Spouse name: Not on file   Number of children: 2   Years of education: 23   Highest education level: Not on file  Occupational History   Not on file  Tobacco Use   Smoking status: Never   Smokeless tobacco: Never  Vaping Use   Vaping Use: Never used  Substance and Sexual Activity   Alcohol use: Yes    Alcohol/week: 0.0 standard drinks of alcohol    Comment: 3-4 drinks per week, on occasion   Drug use: No   Sexual activity: Not on file  Other Topics Concern   Not on file  Social History Narrative   Lives with husband and 2 children in a 2 story home.     Works as an Airline pilot part time.     Education: BS degree.   Right handed   Social Determinants of Health   Financial Resource Strain: Not on file  Food Insecurity: Not on file  Transportation Needs: Not on file  Physical Activity: Not on file  Stress: Not on file  Social Connections: Not on file   No Known Allergies Family History  Problem Relation Age of Onset   Heart murmur Mother        Living, 42    Alzheimer's disease Father        Living, 35   Vascular Disease Brother    Healthy Son    Healthy Daughter        Current Outpatient Medications (Analgesics):    aspirin EC 81 MG tablet, Take 1 tablet (81 mg total) by mouth daily.   meloxicam (MOBIC) 7.5 MG tablet, Take 1 tablet (7.5 mg total) by mouth daily.   Current Outpatient Medications (Other):    Cholecalciferol (VITAMIN D3) 1000 units CAPS, Take 2,000 Units by mouth.   Glucosamine Sulfate (SYNOVACIN PO), Glucosamine   Probiotic Product (PROBIOTIC DAILY PO), Take by mouth.   Reviewed prior external information including notes and imaging from  primary care provider As well as notes that were available from care everywhere and other healthcare systems.  Past medical history, social, surgical and family history all reviewed in electronic medical record.  No pertanent information unless stated regarding to the chief complaint.   Review of Systems:  No headache, visual changes, nausea, vomiting, diarrhea, constipation, dizziness, abdominal pain, skin rash, fevers, chills, night sweats, weight loss, swollen lymph nodes, body aches, joint swelling, chest pain, shortness of breath, mood changes. POSITIVE  muscle aches  Objective  Pulse 78, height 5' (1.524 m), last menstrual period 06/03/2014, SpO2 97 %.   General: No apparent distress alert and oriented x3 mood and affect normal, dressed appropriately.  HEENT: Pupils equal, extraocular movements intact  Respiratory: Patient's speak in full sentences and does not appear short of breath  Cardiovascular: No lower extremity edema, non tender, no erythema  Left shoulder exam shows that patient does have tenderness to palpation with multiple trigger points in the left rhomboid, trapezius, levator scapula. Left hamstring still has tightness compared to the contralateral side but the knee does not have any significant difficulty.  Negative McMurray's.  After verbal consent patient was  prepped with alcohol swab and injected into 3 distinct trigger points in the rhomboid, trapezius and levator scapula.  Total of 3 cc of 0.5% Marcaine and 1 cc of Kenalog 40 mg/mL were used with a 25-gauge half inch needle.  Minimal blood loss.  Postinjection instructions given.    Impression and Recommendations:    The above documentation has been reviewed and is accurate and complete [name], DO

## 2021-12-17 ENCOUNTER — Ambulatory Visit: Payer: Managed Care, Other (non HMO) | Admitting: Family Medicine

## 2021-12-17 DIAGNOSIS — S76302A Unspecified injury of muscle, fascia and tendon of the posterior muscle group at thigh level, left thigh, initial encounter: Secondary | ICD-10-CM | POA: Diagnosis not present

## 2021-12-17 DIAGNOSIS — M25512 Pain in left shoulder: Secondary | ICD-10-CM

## 2021-12-17 NOTE — Assessment & Plan Note (Signed)
Repeat injection given.  Discussed icing regimen and home exercises, discussed which activities to do which ones to avoid.  Patient has been in the trapezius, rhomboid and levator scapula.  Follow-up again in 6 to 8 weeks.

## 2021-12-17 NOTE — Patient Instructions (Signed)
Trigger point injections Going to do great on run Heat before Ice after Keep scheduled appointment

## 2021-12-17 NOTE — Assessment & Plan Note (Signed)
Patient is stable at this time.  Continue to monitor.  Follow-up again in 6 to 8 weeks.

## 2021-12-18 ENCOUNTER — Encounter: Payer: Self-pay | Admitting: Family Medicine

## 2021-12-31 ENCOUNTER — Ambulatory Visit: Payer: Managed Care, Other (non HMO) | Admitting: Neurology

## 2021-12-31 ENCOUNTER — Encounter: Payer: Self-pay | Admitting: Neurology

## 2021-12-31 VITALS — BP 117/85 | HR 82 | Resp 18 | Ht 60.0 in | Wt 123.0 lb

## 2021-12-31 DIAGNOSIS — I773 Arterial fibromuscular dysplasia: Secondary | ICD-10-CM

## 2021-12-31 NOTE — Patient Instructions (Addendum)
We will repeat MRA head and neck  Follow-up with Dr. Donzetta Matters in fall 2024  See you back in 1 year!

## 2021-12-31 NOTE — Progress Notes (Signed)
Follow-up Visit   Date: 12/31/21    Rachel Golden MRN: [id number] DOB: 07/29/1965   Interim History: Rachel Golden is a 56 y.o. right-handed Caucasian female with chronic back pain, fibromuscular dysplasia and stroke for follow-up of FMD. The patient was accompanied to the clinic by self.  History of present illness: On May 13th 2016, she was picking up a hanger off the floor and developed acute onset of dizziness.  She initially thought it was because she got up too quickly.  She then heard a "swooshing" sound in her head and developed a piercing headache. Swooshing sound resolved within 5 minutes.  Within 30-minutes, she developed right hand tingling and numbness of the lips.  She called 911 who evaluated her and did not feel that her symptoms were consistent with a stroke, so was not taken to the emergency department.  She also recalls having neck pain especially with neck rotation to the right. On May 19th, she was getting out of the car and developed neck stiffness and heard a swooshing sound in her right ear with associated brief but severe headache.  She also has associated vision disturbance described as "kelidoscope" vision, dizzy, and nauseous. She had difficulty walking out of her car to her home, but was able to make it into her home slowly.  Symptoms lasted about 10-minutes.   She again went to see Dr. [name] on the same day who ordered MRI/A of her head.  Imaging shows small infarcts involving the right cerebellum and medial right parieto-occipital region. Vessel imaging was shows vascular irregularity involving bilateral vertebral arteries with ?right dissection, concerning for fibromuscular dysplasia but I do not appreciate this well on my review.  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  Her stroke work-up has been returning largely normal including echocardiogram, holter monitor, and vasculitis/hypercoagulable labs. She was very happy with her evaluation with Dr. Kathyrn Sheriff who  suggested that only if symptoms worsen or she develops new headache, would cerebral angiogram be indicated.    She was started on aspirin 352m and lipitor 223mdaily.  No personal or family history of stroke.  No personal history of hypertension.  Of note, she has received cervical manipulation for stiffness this month, which alleviated her neck discomfort.  UPDATE 12/31/2021:  She has been doing great over the past year.  No new neurological complaints.  She saw Dr. CaDonzetta Mattersith Vascular Surgery after CTA abodmen showed FMD changes involving the right renal and superior mesenteric arteries.  She was recommended to have repeat imaging in 2024.  She will be [place] in [place] and [place]   Medications:  Current Outpatient Medications on File Prior to Visit  Medication Sig Dispense Refill   aspirin EC 81 MG tablet Take 1 tablet (81 mg total) by mouth daily.     Cholecalciferol (VITAMIN D3) 1000 units CAPS Take 2,000 Units by mouth.     Glucosamine Sulfate (SYNOVACIN PO) Glucosamine     meloxicam (MOBIC) 7.5 MG tablet Take 1 tablet (7.5 mg total) by mouth daily. 90 tablet 0   Probiotic Product (PROBIOTIC DAILY PO) Take by mouth.     No current facility-administered medications on file prior to visit.    Allergies: No Known Allergies   Vital Signs:  BP 117/85   Pulse 82   Resp 18   Ht 5' (1.524 m)   Wt 123 lb (55.8 kg)   LMP 06/03/2014 (Approximate)   SpO2 96%  BMI 24.02 kg/m   Neurological Exam: MENTAL STATUS including orientation to time, place, person, recent and remote memory, attention span and concentration, language, and fund of knowledge is normal.  Speech is not dysarthric.  CRANIAL NERVES: Pupils equal round and reactive to light.   Normal conjugate, extra-ocular eye movements in all directions of gaze.  No ptosis.  Face is symmetric.   MOTOR:  Motor strength is 5/5 in all extremities.  No pronator  drift.  MSRs:  Reflexes are brisk and symmetric 3+/4 throughout.  SENSORY:  Intact to vibration.  COORDINATION/GAIT:    Gait narrow based and stable.   Data: Labs 08/23/2014:  CRP <0.1, ESR 12, ANA neg, HbA1c 5.5, LDL 55, HDL 76, C3/C4 normal, ENA neg, ANA neg, RF neg, hypercoagulable panel negative (normal protein C and S activity, no Factor V leiden mutation, normal antithrombin III and anticardiolipin antibody)  Surface echocardiogram 08/31/2014:  normal, EF 60% no thrombus  08/30/2014  48-hr holter:   No significant arrhythmias.  No atrial fib or etiology for an embolic neuro event.  Rare atrial and ventricular ectopy noted.  Predominant rhythm is sinus.     MRI brain 08/27/2014:  Subacute nonhemorrhagic small infarcts involving mid to inferior right cerebellum and medial aspect of the right parietal -occipital lobe.  No intracranial hemorrhage.  MRA head 08/27/2014:  Ectatic cavernous segment internal carotid artery bilaterally. Mild narrowing petrous segment of the internal carotid artery bilaterally. Middle cerebral mild to moderate branch vessel narrowing and irregularity bilaterally greater on the right.   1.6 x 1.6 mm bulge M1 segment right middle cerebral artery appears to be origin of a vessel rather than saccular aneurysm. Stability can be confirmed on follow-up.   Moderate narrowing at the junction of the A1 segment and A2 segment of the right anterior cerebral artery.  Ectatic vertebral arteries and basilar artery without high-grade stenosis.  Full extent of the left posterior inferior cerebellar artery not imaged on present exam. This is noted on the contrast-enhanced MR angiogram of the neck and appears ectatic were proximal narrowing.  Nonvisualized left anterior inferior cerebellar artery.  Narrowing and irregularity superior cerebellar artery greater on the left.  Posterior cerebral artery branch vessel narrowing and irregularity greater on the left.   MRA NECK  FINDINGS Ectatic pleated dilated appearance of the vertebral arteries bilaterally suggestive of fibromuscular dysplasia. With the patient's subacute right sided infarcts, subtle focal dissection with subsequent embolic thrombus cannot be entirely excluded although not detected on the current exam   No significant stenosis of either carotid bifurcation. Ectatic internal carotid artery cervical segment bilaterally may also reflect changes of fibromuscular dysplasia but without pleated appearance as noted involving the vertebral arteries.   MRA head 05/03/2015: 1. 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 No associated stenosis. The bilateral V4 segments appear spared. 2. Bilateral ICA dolichoectasia, without the cervical ICA beaded appearance typical of carotid FMD. No carotid stenosis. 3.  Negative intracranial MRA aside from ICA siphon tortuosity.  MRA head and neck 06/20/2018 performed at [facility]: 1.  Multifocal irregularity of the V2 and V3 segments of both vertebral arteries, which may relate to fibromuscular dysplasia.  No evidence of significant stenosis or occlusion.  Prior outside imaging 127/2017 is not available for direct comparison to assess for progression of these findings. 2.  Extensive tortuosity of the cervical and  cavernous bilateral internal carotid arteries, which is nonspecific but can be seen with longstanding hypertension. 3.  No evidence of significant proximal artery narrowing in the head or neck.  MRA head and neck 12/11/2020: 1. Multifocal beading and irregularity involving the bilateral V1 and V2 segments as well as the distal V3 segments, consistent with vertebral artery fibromuscular dysplasia. Findings have progressed as compared to previous MRA from 2017, with at least moderate associated stenoses  involving the proximal V2 segments now seen,right slightly worse than left. 2. 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 No carotid stenosis. 3. Otherwise stable and negative intracranial MRA.  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  Fortunately, she does not have any neurological deficits and has been doing great clinically.  Vessel imaging shows mild progression of bilateral vertebral stenosis.  She also FMD changes involving the right renal and superior mesenteric arteries for which she was evaluated by Dr. Donzetta Matters, Vascular Surgery.  Blood pressure is normal.  - Repeat MRA head and neck for surveillance - Continue aspirin 70m daily  Return to clinic in 1 year.   Thank you for allowing me to participate in patient's care.  If I can answer any additional questions, I would be pleased to do so.    Sincerely,    Demetri Goshert K. PPosey Pronto DO

## 2022-01-26 ENCOUNTER — Ambulatory Visit
Admission: RE | Admit: 2022-01-26 | Discharge: 2022-01-26 | Disposition: A | Discharge status: Home / Self Care | Payer: Managed Care, Other (non HMO) | Source: Ambulatory Visit | Attending: Neurology | Admitting: Neurology

## 2022-01-26 DIAGNOSIS — I773 Arterial fibromuscular dysplasia: Secondary | ICD-10-CM

## 2022-01-26 MED ORDER — GADOPICLENOL 0.5 MMOL/ML IV SOLN
6.0000 mL | Freq: Once | INTRAVENOUS | Status: AC | PRN
  Administered 2022-01-26: 6 mL via INTRAVENOUS

## 2022-01-29 ENCOUNTER — Ambulatory Visit: Payer: Managed Care, Other (non HMO) | Admitting: Family Medicine

## 2022-01-29 ENCOUNTER — Ambulatory Visit: Payer: Self-pay

## 2022-01-29 ENCOUNTER — Telehealth: Payer: Self-pay

## 2022-01-29 VITALS — BP 118/82 | HR 63 | Ht 60.0 in | Wt 121.0 lb

## 2022-01-29 DIAGNOSIS — M9903 Segmental and somatic dysfunction of lumbar region: Secondary | ICD-10-CM

## 2022-01-29 DIAGNOSIS — M7121 Synovial cyst of popliteal space [Baker], right knee: Secondary | ICD-10-CM

## 2022-01-29 DIAGNOSIS — M9904 Segmental and somatic dysfunction of sacral region: Secondary | ICD-10-CM

## 2022-01-29 DIAGNOSIS — M25561 Pain in right knee: Secondary | ICD-10-CM

## 2022-01-29 DIAGNOSIS — M9902 Segmental and somatic dysfunction of thoracic region: Secondary | ICD-10-CM | POA: Diagnosis not present

## 2022-01-29 DIAGNOSIS — S83241A Other tear of medial meniscus, current injury, right knee, initial encounter: Secondary | ICD-10-CM

## 2022-01-29 NOTE — Telephone Encounter (Signed)
Patient returned call and was informed of vessel imaging results. Patient verbalized understanding and thanked me for the call. She wanted to to just let Dr. Posey Pronto know she is seeing her Cardiology next week.

## 2022-01-29 NOTE — Patient Instructions (Addendum)
Drained knee today Will watch other cyst, but should be fine  See you again in 6-8 weeks

## 2022-01-29 NOTE — Progress Notes (Signed)
[name] [address] [place] [address] Phone: [phone] Subjective:    I'm seeing this patient by the request  of:  Jarrett Soho, PA-C  CC: Right knee and low back pain  [id number]  Rachel Golden is a 56 y.o. female coming in with complaint of back and neck pain Patient states doing well. Running a half marathon soon. Wants manipulation and recheck of right knee.  Medications patient has been prescribed:   Taking:         Reviewed prior external information including notes and imaging from previsou exam, outside providers and external EMR if available.   As well as notes that were available from care everywhere and other healthcare systems.  Past medical history, social, surgical and family history all reviewed in electronic medical record.  No pertanent information unless stated regarding to the chief complaint.   Past Medical History:  Diagnosis Date   Chronic back pain    Sleep apnea    Stroke (HCC)     No Known Allergies   Review of Systems:  No headache, visual changes, nausea, vomiting, diarrhea, constipation, dizziness, abdominal pain, skin rash, fevers, chills, night sweats, weight loss, swollen lymph nodes, body aches, joint swelling, chest pain, shortness of breath, mood changes. POSITIVE muscle aches  Objective  Blood pressure 118/82, pulse 63, height 5' (1.524 m), weight 121 lb (54.9 kg), last menstrual period 06/03/2014, SpO2 97 %.   General: No apparent distress alert and oriented x3 mood and affect normal, dressed appropriately.  HEENT: Pupils equal, extraocular movements intact  Respiratory: Patient's speak in full sentences and does not appear short of breath  Cardiovascular: No lower extremity edema, non tender, no erythema  Very mild antalgic gait noted. Right back exam does have some loss of lordosis.  Some tenderness to palpation more over the right sacroiliac joint. Right knee though does have  tenderness to palpation in the posterior aspect positive McMurray's.  Limited muscular skeletal ultrasound was performed and interpreted by Antoine Primas, M  Limited ultrasound of patient's knee shows that patient does have a hypoechoic mass noted that appears to be more of a Baker's cyst.  In addition of this this seems to wrap around the hamstring tendon.  Patient also has what appears to be a parameniscal cyst in the same vicinity. Impression: Baker's cyst with parameniscal cyst  Procedure: Real-time Ultrasound Guided Injection and aspiration of right Baker's cyst Device: GE Logiq Q7 Ultrasound guided injection is preferred based studies that show increased duration, increased effect, greater accuracy, decreased procedural pain, increased response rate, and decreased cost with ultrasound guided versus blind injection.  Verbal informed consent obtained.  Time-out conducted.  Noted no overlying erythema, induration, or other signs of local infection.  Skin prepped in a sterile fashion.  Local anesthesia: Topical Ethyl chloride.  With sterile technique and under real time ultrasound guidance: With an 18-gauge 1-1/2 inch needle injected with 0.5 cc of 0.5% Marcaine and aspirated 26 cc of straw light-colored fluid then injected 1 cc of Kenalog 40 mg/mL Completed without difficulty  Pain immediately resolved suggesting accurate placement of the medication.  Advised to call if fevers/chills, erythema, induration, drainage, or persistent bleeding.  Impression: Technically successful ultrasound guided injection.  Osteopathic findings  T9 extended rotated and side bent left L2 flexed rotated and side bent right Sacrum right on right       Assessment and Plan:  Baker's cyst of knee, right Aspiration done today and tolerated the  procedure well.  Discussed icing regimen and home exercises.  Discussed potential compression.  Patient also still has what appears to be a degenerative meniscal  tear noted on the posterior medial aspect with a parameniscal cyst noted.  We discussed with patient that we will continue to monitor.  Worsening pain can consider potential aspiration.  Right medial meniscus tear Chronic problem with worsening symptoms overall.  Been on 3 months of treatment.  We do need to continue to monitor.  Parameniscal cyst noted.  Did have the Baker's cyst noted seem to be more concerning.    Nonallopathic problems  Decision today to treat with OMT was based on Physical Exam  After verbal consent patient was treated with HVLA, ME, FPR techniques in thoracic, lumbar, and sacral  areas  Patient tolerated the procedure well with improvement in symptoms  Patient given exercises, stretches and lifestyle modifications  See medications in patient instructions if given  Patient will follow up in 4-8 weeks             Note: This dictation was prepared with Dragon dictation along with smaller phrase technology. Any transcriptional errors that result from this process are unintentional.

## 2022-01-30 DIAGNOSIS — M7121 Synovial cyst of popliteal space [Baker], right knee: Secondary | ICD-10-CM | POA: Insufficient documentation

## 2022-01-30 NOTE — Assessment & Plan Note (Signed)
Chronic problem with worsening symptoms overall.  Been on 3 months of treatment.  We do need to continue to monitor.  Parameniscal cyst noted.  Did have the Baker's cyst noted seem to be more concerning.

## 2022-01-30 NOTE — Assessment & Plan Note (Signed)
Aspiration done today and tolerated the procedure well.  Discussed icing regimen and home exercises.  Discussed potential compression.  Patient also still has what appears to be a degenerative meniscal tear noted on the posterior medial aspect with a parameniscal cyst noted.  We discussed with patient that we will continue to monitor.  Worsening pain can consider potential aspiration.

## 2022-02-04 NOTE — Progress Notes (Unsigned)
Cardiology Office Note   Date:  02/05/2022   ID:  Rachel Golden, DOB [birth year], MRN [id number]  PCP:  Jarrett Soho, PA-C  Cardiologist:   None Referring:  Jarrett Soho, PA-C  Chief Complaint  Patient presents with   Palpitations      History of Present Illness: Rachel Golden is a 56 y.o. 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  She is referred by Jarrett Soho, PA-C.   Neuro work up and work up for arrhythmia demonstrated no abnormalities.    Echo bubble study in 2016 demonstrated no significant findings.  She runs quite a bit.  She is 5K and 10K back-to-back recently.  She is going to run a half marathon and is training for marathon later this spring.  She is noticed that her heart rate has been going up at times on her watch to 167.  However, it takes a while to go up and that it comes right back down.  In the middle of exercising when her heart rate is up she feels fine. The patient denies any new symptoms such as chest discomfort, neck or arm discomfort. There has been no new shortness of breath, PND or orthopnea. There have been no reported palpitations, presyncope or syncope.  She had no residual from her stroke which was related to fibromuscular dysplasia.  Of note she had an MRI in October with evidence of fibromuscular dysplasia is unchanged with moderate stenosis in bilateral V2.   Past Medical History:  Diagnosis Date   Chronic back pain    Fibromuscular dysplasia (HCC)    Sleep apnea    Stroke [facility])     Past Surgical History:  Procedure Laterality Date   NASAL SEPTUM SURGERY       Current Outpatient Medications  Medication Sig Dispense Refill   aspirin EC 81 MG tablet Take 1 tablet (81 mg total) by mouth daily.     Cholecalciferol (VITAMIN D3) 1000 units CAPS Take 2,000 Units by mouth.     fluorouracil (EFUDEX) 5 % cream Apply topically.     Glucosamine Sulfate (SYNOVACIN PO) Glucosamine     meloxicam (MOBIC) 7.5 MG tablet Take 1  tablet (7.5 mg total) by mouth daily. 90 tablet 0   Probiotic Product (PROBIOTIC DAILY PO) Take by mouth.     No current facility-administered medications for this visit.    Allergies:   Patient has no known allergies.    Social History:  The patient  reports that she has never smoked. She has never used smokeless tobacco. She reports current alcohol use. She reports that she does not use drugs.   Family History:  The patient's family history includes Alzheimer's disease in her father; Healthy in her daughter and son; Heart murmur in her mother; Vascular Disease in her brother.    ROS:  Please see the history of present illness.   Otherwise, review of systems are positive for none.   All other systems are reviewed and negative.    PHYSICAL EXAM: VS:  BP 106/70   Pulse 64   Ht 5' (1.524 m)   Wt 121 lb (54.9 kg)   LMP 06/03/2014 (Approximate)   SpO2 97%   BMI 23.63 kg/m  , BMI Body mass index is 23.63 kg/m. GENERAL:  Well appearing HEENT:  Pupils equal round and reactive, fundi not visualized, oral mucosa unremarkable NECK:  No jugular venous distention, waveform within normal limits, carotid upstroke brisk and symmetric, no bruits, no thyromegaly LYMPHATICS:  No cervical, inguinal adenopathy LUNGS:  Clear to auscultation bilaterally BACK:  No CVA tenderness CHEST:  Unremarkable HEART:  PMI not displaced or sustained,S1 and S2 within normal limits, no S3, no S4, no clicks, no rubs, no murmurs ABD:  Flat, positive bowel sounds normal in frequency in pitch, no bruits, no rebound, no guarding, no midline pulsatile mass, no hepatomegaly, no splenomegaly EXT:  2 plus pulses throughout, no edema, no cyanosis no clubbing SKIN:  No rashes no nodules NEURO:  Cranial nerves II through XII grossly intact, motor grossly intact throughout PSYCH:  Cognitively intact, oriented to person place and time    EKG:  EKG is ordered today. The ekg ordered today demonstrates sinus rhythm, rate 64,  axis within normal limits, intervals within normal limits, no acute ST-T wave changes.  RSR prime V1 and V2, left axis deviation   Recent Labs: No results found for requested labs within last 365 days.    Lipid Panel    Component Value Date/Time   CHOL 158 08/29/2014 1053   TRIG 133 08/29/2014 1053   HDL 76 08/29/2014 1053   CHOLHDL 2.1 08/29/2014 1053   VLDL 27 08/29/2014 1053   LDLCALC 55 08/29/2014 1053      Wt Readings from Last 3 Encounters:  02/05/22 121 lb (54.9 kg)  01/29/22 121 lb (54.9 kg)  12/31/21 123 lb (55.8 kg)      Other studies Reviewed: Additional studies/ records that were reviewed today include: Labs. Review of the above records demonstrates:  Please see elsewhere in the note.     ASSESSMENT AND PLAN:  CVA: This is thought to be related to her fibromuscular dysplasia.  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  She continues aspirin.  Sleep apnea: She uses CPAP and loves it.  Tachycardia: She has a normal response with a slow increase in her heart rate and a rapid drop off consistent with good vagal tone.  She is not having any symptoms.  This is likely sinus tachycardia only related to exercise.  No further work-up  Risk reduction: I would like her to get a calcium score.  She does have mildly elevated LDL of 94 with an HDL of 76.  She has been dieting and changed her diet so I will repeat a lipid profile.  Current medicines are reviewed at length with the patient today.  The patient does not have concerns regarding medicines.  The following changes have been made:  no change  Labs/ tests ordered today include:   Orders Placed This Encounter  Procedures   CT CARDIAC SCORING (SELF PAY ONLY)   Lipid panel   EKG 12-Lead     Disposition:   FU with me as needed.     Signed, Minus Breeding, MD  02/05/2022 1:42  PM    [place]

## 2022-02-05 ENCOUNTER — Ambulatory Visit: Payer: Managed Care, Other (non HMO) | Attending: Cardiology | Admitting: Cardiology

## 2022-02-05 ENCOUNTER — Encounter: Payer: Self-pay | Admitting: Cardiology

## 2022-02-05 VITALS — BP 106/70 | HR 64 | Ht 60.0 in | Wt 121.0 lb

## 2022-02-05 DIAGNOSIS — I639 Cerebral infarction, unspecified: Secondary | ICD-10-CM

## 2022-02-05 DIAGNOSIS — Z8249 Family history of ischemic heart disease and other diseases of the circulatory system: Secondary | ICD-10-CM

## 2022-02-05 NOTE — Patient Instructions (Addendum)
Medication Instructions:  Your Physician recommend you continue on your current medication as directed.    *If you need a refill on your cardiac medications before your next appointment, please call your pharmacy*   Lab Work: Your physician recommends that you return for lab work (fasting lipid)  If you have labs (blood work) drawn today and your tests are completely normal, you will receive your results only by: MyChart Message (if you have MyChart) OR A paper copy in the mail If you have any lab test that is abnormal or we need to change your treatment, we will call you to review the results.   Testing/Procedures: CT coronary calcium score.   Test locations:  [facility]   This is $99 out of pocket.   Coronary CalciumScan A coronary calcium scan is an imaging test used to look for deposits of calcium and other fatty materials (plaques) in the inner lining of the blood vessels of the heart (coronary arteries). These deposits of calcium and plaques can partly clog and narrow the coronary arteries without producing any symptoms or warning signs. This puts a person at risk for a heart attack. This test can detect these deposits before symptoms develop. Tell a health care provider about: Any allergies you have. All medicines you are taking, including vitamins, herbs, eye drops, creams, and over-the-counter medicines. Any problems you or family members have had with anesthetic medicines. Any blood disorders you have. Any surgeries you have had. Any medical conditions you have. Whether you are pregnant or may be pregnant. What are the risks? Generally, this is a safe procedure. However, problems may occur, including: Harm to a pregnant woman and her unborn baby. This test involves the use of radiation. Radiation exposure can be dangerous to a pregnant woman and her unborn baby. If you are pregnant, you generally should not have this procedure done. Slight increase  in the risk of cancer. This is because of the radiation involved in the test. What happens before the procedure? No preparation is needed for this procedure. What happens during the procedure? You will undress and remove any jewelry around your neck or chest. You will put on a hospital gown. Sticky electrodes will be placed on your chest. The electrodes will be connected to an electrocardiogram (ECG) machine to record a tracing of the electrical activity of your heart. A CT scanner will take pictures of your heart. During this time, you will be asked to lie still and hold your breath for 2-3 seconds while a picture of your heart is being taken. The procedure may vary among health care providers and hospitals. What happens after the procedure? You can get dressed. You can return to your normal activities. It is up to you to get the results of your test. Ask your health care provider, or the department that is doing the test, when your results will be ready. Summary A coronary calcium scan is an imaging test used to look for deposits of calcium and other fatty materials (plaques) in the inner lining of the blood vessels of the heart (coronary arteries). Generally, this is a safe procedure. Tell your health care provider if you are pregnant or may be pregnant. No preparation is needed for this procedure. A CT scanner will take pictures of your heart. You can return to your normal activities after the scan is done. This information is not intended to replace advice given to you by your health care provider. Make sure you discuss any questions  you have with your health care provider. Document Released: 09/19/2007 Document Revised: 02/10/2016 Document Reviewed: 02/10/2016 Elsevier Interactive Patient Education  2017 [place]: At [facility], you and your health needs are our priority.  As part of our continuing mission to provide you with exceptional heart care, we  have created designated Provider Care Teams.  These Care Teams include your primary Cardiologist (physician) and Advanced Practice Providers (APPs -  Physician Assistants and Nurse Practitioners) who all work together to provide you with the care you need, when you need it.  We recommend signing up for the patient portal called "MyChart".  Sign up information is provided on this After Visit Summary.  MyChart is used to connect with patients for Virtual Visits (Telemedicine).  Patients are able to view lab/test results, encounter notes, upcoming appointments, etc.  Non-urgent messages can be sent to your provider as well.   To learn more about what you can do with MyChart, go to [email or web address].    Your next appointment:   As needed  The format for your next appointment:   In Person  Provider:   Dr. Percival Spanish

## 2022-02-11 ENCOUNTER — Other Ambulatory Visit: Payer: Self-pay | Admitting: Family Medicine

## 2022-02-17 ENCOUNTER — Ambulatory Visit (HOSPITAL_COMMUNITY)
Admission: RE | Admit: 2022-02-17 | Discharge: 2022-02-17 | Disposition: A | Discharge status: Home / Self Care | Payer: Managed Care, Other (non HMO) | Source: Ambulatory Visit | Attending: Cardiology | Admitting: Cardiology

## 2022-02-17 DIAGNOSIS — Z8249 Family history of ischemic heart disease and other diseases of the circulatory system: Secondary | ICD-10-CM | POA: Insufficient documentation

## 2022-03-03 ENCOUNTER — Encounter: Payer: Self-pay | Admitting: Family Medicine

## 2022-03-03 ENCOUNTER — Other Ambulatory Visit: Payer: Self-pay

## 2022-03-05 NOTE — Progress Notes (Signed)
[name] [address] [place] [address] Phone: [phone] Subjective:    I'm seeing this patient by the request  of:  Jarrett Soho, PA-C  CC: right arm pain   [id number]  Rachel Golden is a 56 y.o. female coming in with complaint of lat injury. Patient states Sunday night patient did her normal nighttime yoga stretches and when she was done she tried to stand up she noticed a sharp pain in right lat muscle. Patient saw her PCP and was told it was the lat muscle strain and to use heat. Patient switched to ice per our advise. Pain was an 8/10 all the time then it would be 3-4/10 all the time but if she tried to run or stretch the pain goes to 8/10 again        Past Medical History:  Diagnosis Date   Chronic back pain    Fibromuscular dysplasia (HCC)    Sleep apnea    Stroke [facility])    Past Surgical History:  Procedure Laterality Date   NASAL SEPTUM SURGERY     Social History   Socioeconomic History   Marital status: Married    Spouse name: Not on file   Number of children: 2   Years of education: 6   Highest education level: Not on file  Occupational History   Not on file  Tobacco Use   Smoking status: Never   Smokeless tobacco: Never  Vaping Use   Vaping Use: Never used  Substance and Sexual Activity   Alcohol use: Yes    Alcohol/week: 0.0 standard drinks of alcohol    Comment: 3-4 drinks per week, on occasion   Drug use: No   Sexual activity: Not on file  Other Topics Concern   Not on file  Social History Narrative   Lives with husband and 2 children in a 2 story home.     Works as an Airline pilot part time.     Education: BS degree.   Right handed   Drinks caffeine   Social Determinants of Health   Financial Resource Strain: Not on file  Food Insecurity: Not on file  Transportation Needs: Not on file  Physical Activity: Not on file  Stress: Not on file  Social Connections: Not on file   No Known  Allergies Family History  Problem Relation Age of Onset   Heart murmur Mother        Living, 43   Alzheimer's disease Father        Living, 34   Vascular Disease Brother    Healthy Son    Healthy Daughter        Current Outpatient Medications (Analgesics):    aspirin EC 81 MG tablet, Take 1 tablet (81 mg total) by mouth daily.   meloxicam (MOBIC) 7.5 MG tablet, TAKE 1 TABLET BY MOUTH EVERY DAY   Current Outpatient Medications (Other):    Cholecalciferol (VITAMIN D3) 1000 units CAPS, Take 2,000 Units by mouth.   fluorouracil (EFUDEX) 5 % cream, Apply topically.   Glucosamine Sulfate (SYNOVACIN PO), Glucosamine   Probiotic Product (PROBIOTIC DAILY PO), Take by mouth.   Reviewed prior external information including notes and imaging from  primary care provider As well as notes that were available from care everywhere and other healthcare systems.  Past medical history, social, surgical and family history all reviewed in electronic medical record.  No pertanent information unless stated regarding to the chief complaint.   Review of Systems:  No headache, visual changes, nausea, vomiting, diarrhea, constipation, dizziness, \ skin rash, fevers, chills, night sweats, weight loss, swollen lymph nodes, body aches, joint swelling, chest pain, shortness of breath, mood changes. POSITIVE muscle aches mild abdominal pain  Objective  Blood pressure 120/82, pulse 93, height 5' (1.524 m), weight 122 lb (55.3 kg), last menstrual period 06/03/2014, SpO2 98 %.   General: No apparent distress alert and oriented x3 mood and affect normal, dressed appropriately.  HEENT: Pupils equal, extraocular movements intact  Respiratory: Patient's speak in full sentences and does not appear short of breath  Cardiovascular: No lower extremity edema, non tender, no erythema  Patient does have moderate right-sided abdominal pain with meals.  She is only anterior aspect patient does have maybe some mild pain  over the bone itself.    Impression and Recommendations:    The above documentation has been reviewed and is accurate and complete [name], DO

## 2022-03-06 ENCOUNTER — Ambulatory Visit: Payer: Managed Care, Other (non HMO) | Admitting: Family Medicine

## 2022-03-06 ENCOUNTER — Ambulatory Visit (INDEPENDENT_AMBULATORY_CARE_PROVIDER_SITE_OTHER): Payer: Managed Care, Other (non HMO)

## 2022-03-06 VITALS — BP 120/82 | HR 93 | Ht 60.0 in | Wt 122.0 lb

## 2022-03-06 DIAGNOSIS — R0781 Pleurodynia: Secondary | ICD-10-CM

## 2022-03-06 DIAGNOSIS — R1011 Right upper quadrant pain: Secondary | ICD-10-CM | POA: Diagnosis not present

## 2022-03-06 NOTE — Assessment & Plan Note (Signed)
Right abdominal pain noted.  Seems to be with will be anticipated.  Mild positive McMurray's.  Will get an ultrasound to further evaluate.  No fevers or chills, patient has noticed some mild difference in her bowel movements recently.  Past medical history significant for fibromuscular dysplasia as well that we will monitor and discussed that if worsening pain to seek medical attention.  Differential also includes a interosseous muscular injury

## 2022-03-06 NOTE — Patient Instructions (Addendum)
RUQ [place] If worsening ab pain, nausea, vomiting seek medical attention Keep your other appointment

## 2022-03-10 ENCOUNTER — Ambulatory Visit
Admission: RE | Admit: 2022-03-10 | Discharge: 2022-03-10 | Disposition: A | Discharge status: Home / Self Care | Payer: Managed Care, Other (non HMO) | Source: Ambulatory Visit | Attending: Family Medicine | Admitting: Family Medicine

## 2022-03-10 DIAGNOSIS — R0781 Pleurodynia: Secondary | ICD-10-CM

## 2022-03-10 NOTE — Progress Notes (Unsigned)
[name] [address] [place] [address] Phone: [phone] Subjective:    I'm seeing this patient by the request  of:  Jarrett Soho, PA-C  CC: Back pain and side pain follow-up  [id number]  03/06/2022 Right abdominal pain noted.  Seems to be with will be anticipated.  Mild positive McMurray's.  Will get an ultrasound to further evaluate.  No fevers or chills, patient has noticed some mild difference in her bowel movements recently.  Past medical history significant for fibromuscular dysplasia as well that we will monitor and discussed that if worsening pain to seek medical attention.  Differential also includes a interosseous muscular injury      Update 03/12/2022 Rachel Golden is a 56 y.o. female coming in with complaint of RUQ and knee pain. Patient states that she is feeling much better than the last visit. Still does feel the pain in the area more when she sits for long periods of time. Right knee is all better.  No problems whatsoever at this time.  Patient right upper quadrant pain is improving as well.  Was deviated to working out recently.  Has not noticed any significant association with food at the moment.  Did have the ultrasound done that was reviewed by me and showing no significant gallstones.      Past Medical History:  Diagnosis Date   Chronic back pain    Fibromuscular dysplasia (HCC)    Sleep apnea    Stroke [facility])    Past Surgical History:  Procedure Laterality Date   NASAL SEPTUM SURGERY     Social History   Socioeconomic History   Marital status: Married    Spouse name: Not on file   Number of children: 2   Years of education: 30   Highest education level: Not on file  Occupational History   Not on file  Tobacco Use   Smoking status: Never   Smokeless tobacco: Never  Vaping Use   Vaping Use: Never used  Substance and Sexual Activity   Alcohol use: Yes    Alcohol/week: 0.0 standard drinks of alcohol     Comment: 3-4 drinks per week, on occasion   Drug use: No   Sexual activity: Not on file  Other Topics Concern   Not on file  Social History Narrative   Lives with husband and 2 children in a 2 story home.     Works as an Airline pilot part time.     Education: BS degree.   Right handed   Drinks caffeine   Social Determinants of Health   Financial Resource Strain: Not on file  Food Insecurity: Not on file  Transportation Needs: Not on file  Physical Activity: Not on file  Stress: Not on file  Social Connections: Not on file   No Known Allergies Family History  Problem Relation Age of Onset   Heart murmur Mother        Living, 26   Alzheimer's disease Father        Living, 32   Vascular Disease Brother    Healthy Son    Healthy Daughter        Current Outpatient Medications (Analgesics):    aspirin EC 81 MG tablet, Take 1 tablet (81 mg total) by mouth daily.   meloxicam (MOBIC) 7.5 MG tablet, TAKE 1 TABLET BY MOUTH EVERY DAY   Current Outpatient Medications (Other):    Cholecalciferol (VITAMIN D3) 1000 units CAPS, Take 2,000 Units by mouth.  fluorouracil (EFUDEX) 5 % cream, Apply topically.   Glucosamine Sulfate (SYNOVACIN PO), Glucosamine   Probiotic Product (PROBIOTIC DAILY PO), Take by mouth.   Reviewed prior external information including notes and imaging from  primary care provider As well as notes that were available from care everywhere and other healthcare systems.  Past medical history, social, surgical and family history all reviewed in electronic medical record.  No pertanent information unless stated regarding to the chief complaint.   Review of Systems:  No headache, visual changes, nausea, vomiting, diarrhea, constipation, dizziness, abdominal pain, skin rash, fevers, chills, night sweats, weight loss, swollen lymph nodes, body aches, joint swelling, chest pain, shortness of breath, mood changes. POSITIVE muscle aches  Objective  Blood pressure  110/80, pulse 61, height 5' (1.524 m), weight 122 lb (55.3 kg), last menstrual period 06/03/2014, SpO2 97 %.   General: No apparent distress alert and oriented x3 mood and affect normal, dressed appropriately.  HEENT: Pupils equal, extraocular movements intact  Respiratory: Patient's speak in full sentences and does not appear short of breath  Cardiovascular: No lower extremity edema, non tender, no erythema  Low back exam does still have some mild tightness noted.  Some tenderness to palpation in the paraspinal musculature. Still has tenderness in the right upper quadrant to area but not as severe as what it was.  Sleep.  Negative Murphy sign at the moment.  Osteopathic findings T3 extended rotated and side bent right inhaled third rib T9 extended rotated and side bent right  L2 flexed rotated and side bent right L4 F RS left  Sacrum right on right     Impression and Recommendations:    The above documentation has been reviewed and is accurate and complete [name], DO

## 2022-03-12 ENCOUNTER — Ambulatory Visit: Payer: Managed Care, Other (non HMO) | Admitting: Family Medicine

## 2022-03-12 VITALS — BP 110/80 | HR 61 | Ht 60.0 in | Wt 122.0 lb

## 2022-03-12 DIAGNOSIS — M9903 Segmental and somatic dysfunction of lumbar region: Secondary | ICD-10-CM | POA: Diagnosis not present

## 2022-03-12 DIAGNOSIS — M9902 Segmental and somatic dysfunction of thoracic region: Secondary | ICD-10-CM

## 2022-03-12 DIAGNOSIS — M999 Biomechanical lesion, unspecified: Secondary | ICD-10-CM | POA: Diagnosis not present

## 2022-03-12 DIAGNOSIS — M9904 Segmental and somatic dysfunction of sacral region: Secondary | ICD-10-CM | POA: Diagnosis not present

## 2022-03-12 DIAGNOSIS — M545 Low back pain, unspecified: Secondary | ICD-10-CM | POA: Diagnosis not present

## 2022-03-12 NOTE — Assessment & Plan Note (Signed)

## 2022-03-12 NOTE — Assessment & Plan Note (Signed)
Chronic problem but overall stable.  Patient does respond well to osteopathic manipulation.  Patient likely has more of an intercostal muscle injury that likely is contributing.  Discussed with patient that this likely will get better with time.  We discussed avoiding certain rotational components and lifting at the same time.  Discussed taking deep breaths.  Follow-up again in 6 weeks

## 2022-03-12 NOTE — Patient Instructions (Addendum)
Good to see you  Happy Holliday's  Travel safe  Follow up in 6 weeks

## 2022-03-18 ENCOUNTER — Ambulatory Visit: Payer: Managed Care, Other (non HMO) | Admitting: Family Medicine

## 2022-03-19 ENCOUNTER — Ambulatory Visit: Payer: Managed Care, Other (non HMO) | Admitting: Family Medicine

## 2022-04-29 NOTE — Progress Notes (Deleted)
  [place] [address] [place] Phone: [phone] Subjective:    I'm seeing this patient by the request  of:  Marda Stalker, PA-C  CC: back and neck pain   [id number]  Rachel Golden is a 57 y.o. female coming in with complaint of back and neck pain. OMT on 03/12/2022.  We did see patient previously and also had what appeared to be an acute latissimus muscle injury.  Patient states   Medications patient has been prescribed: Meloxicam  Taking:         Reviewed prior external information including notes and imaging from previsou exam, outside providers and external EMR if available.   As well as notes that were available from care everywhere and other healthcare systems.  Past medical history, social, surgical and family history all reviewed in electronic medical record.  No pertanent information unless stated regarding to the chief complaint.   Past Medical History:  Diagnosis Date   Chronic back pain    Fibromuscular dysplasia (HCC)    Sleep apnea    Stroke ([place])     No Known Allergies   Review of Systems:  No headache, visual changes, nausea, vomiting, diarrhea, constipation, dizziness, abdominal pain, skin rash, fevers, chills, night sweats, weight loss, swollen lymph nodes, body aches, joint swelling, chest pain, shortness of breath, mood changes. POSITIVE muscle aches  Objective  Last menstrual period 06/03/2014.   General: No apparent distress alert and oriented x3 mood and affect normal, dressed appropriately.  HEENT: Pupils equal, extraocular movements intact  Respiratory: Patient's speak in full sentences and does not appear short of breath  Cardiovascular: No lower extremity edema, non tender, no erythema  Gait MSK:  Back   Osteopathic findings  C2 flexed rotated and side bent right C6 flexed rotated and side bent left T3 extended rotated and side bent right inhaled rib T9 extended rotated and  side bent left L2 flexed rotated and side bent right Sacrum right on right       Assessment and Plan:  No problem-specific Assessment & Plan notes found for this encounter.    Nonallopathic problems  Decision today to treat with OMT was based on Physical Exam  After verbal consent patient was treated with HVLA, ME, FPR techniques in cervical, rib, thoracic, lumbar, and sacral  areas  Patient tolerated the procedure well with improvement in symptoms  Patient given exercises, stretches and lifestyle modifications  See medications in patient instructions if given  Patient will follow up in 4-8 weeks    The above documentation has been reviewed and is accurate and complete Lyndal Pulley, DO          Note: This dictation was prepared with Dragon dictation along with smaller phrase technology. Any transcriptional errors that result from this process are unintentional.

## 2022-04-30 ENCOUNTER — Ambulatory Visit: Payer: Managed Care, Other (non HMO) | Admitting: Family Medicine

## 2022-05-14 ENCOUNTER — Encounter: Payer: Self-pay | Admitting: Family Medicine

## 2022-05-21 ENCOUNTER — Ambulatory Visit: Payer: Managed Care, Other (non HMO) | Admitting: Family Medicine

## 2022-05-27 ENCOUNTER — Ambulatory Visit: Payer: Managed Care, Other (non HMO) | Admitting: Family Medicine

## 2022-05-27 NOTE — Progress Notes (Unsigned)
[place] Phone: [phone] Subjective:   Fontaine No, am serving as a scribe for Dr. Hulan Saas.  I'm seeing this patient by the request  of:  Marda Stalker, PA-C  CC: Back pain, neck pain and groin pain  [id number]  Rachel Golden is a 57 y.o. female coming in with complaint of back and neck pain. OMT 03/12/2022. Patient states that her back is doing well.   Noticed pain in R side of groin near proximal adductor. Has popping in hip with ER. No pain with popping but wants to know if this is going to be problematic. Does stretch for 10 minutes before and after run. Also notes some pain with hip flexor on the R side.   Medications patient has been prescribed: Meloxicam  Taking:         Reviewed prior external information including notes and imaging from previsou exam, outside providers and external EMR if available.   As well as notes that were available from care everywhere and other healthcare systems.  Past medical history, social, surgical and family history all reviewed in electronic medical record.  No pertanent information unless stated regarding to the chief complaint.   Past Medical History:  Diagnosis Date   Chronic back pain    Fibromuscular dysplasia (HCC)    Sleep apnea    Stroke ([place])     No Known Allergies   Review of Systems:  No headache, visual changes, nausea, vomiting, diarrhea, constipation, dizziness, abdominal pain, skin rash, fevers, chills, night sweats, weight loss, swollen lymph nodes, body aches, joint swelling, chest pain, shortness of breath, mood changes. POSITIVE muscle aches  Objective  Blood pressure 108/84, pulse 71, height 5' (1.524 m), weight 123 lb (55.8 kg), last menstrual period 06/03/2014, SpO2 98 %.   General: No apparent distress alert and oriented x3 mood and affect normal, dressed appropriately.  HEENT: Pupils equal, extraocular movements  intact  Respiratory: Patient's speak in full sentences and does not appear short of breath  Cardiovascular: No lower extremity edema, non tender, no erythema  Patient does have some tightness noted in the lower back.  Patient does have tightness with Corky Sox right greater than left.  Does have some pain over the abductor muscle group.  Osteopathic findings  T3 extended rotated and side bent right  T9 extended rotated and side bent left L2 flexed rotated and side bent right Sacrum right on right       Assessment and Plan:  Lumbar back pain Back pain with now more of an abductor muscle discomfort.  Do not think there is any true tearing noted.  Patient is able to do her do her run I feel.  Discussed icing regimen and home exercises, discussed which activities to do and which ones to avoid, increase activity slowly.  Follow-up again in 6 to 8 weeks.    Nonallopathic problems  Decision today to treat with OMT was based on Physical Exam  After verbal consent patient was treated with HVLA, ME, FPR techniques in  thoracic, lumbar, and sacral  areas  Patient tolerated the procedure well with improvement in symptoms  Patient given exercises, stretches and lifestyle modifications  See medications in patient instructions if given  Patient will follow up in 4-8 weeks     The above documentation has been reviewed and is accurate and complete Lyndal Pulley, DO         Note: This dictation was  prepared with Dragon dictation along with smaller phrase technology. Any transcriptional errors that result from this process are unintentional.

## 2022-05-28 ENCOUNTER — Encounter: Payer: Self-pay | Admitting: Family Medicine

## 2022-05-28 ENCOUNTER — Ambulatory Visit: Payer: Managed Care, Other (non HMO) | Admitting: Family Medicine

## 2022-05-28 VITALS — BP 108/84 | HR 71 | Ht 60.0 in | Wt 123.0 lb

## 2022-05-28 DIAGNOSIS — M9903 Segmental and somatic dysfunction of lumbar region: Secondary | ICD-10-CM

## 2022-05-28 DIAGNOSIS — M9902 Segmental and somatic dysfunction of thoracic region: Secondary | ICD-10-CM | POA: Diagnosis not present

## 2022-05-28 DIAGNOSIS — M9904 Segmental and somatic dysfunction of sacral region: Secondary | ICD-10-CM

## 2022-05-28 DIAGNOSIS — M545 Low back pain, unspecified: Secondary | ICD-10-CM | POA: Diagnosis not present

## 2022-05-28 NOTE — Assessment & Plan Note (Addendum)
Back pain with now more of an abductor muscle discomfort.  Do not think there is any true tearing noted.  Patient is able to do her do her run I feel.  Discussed icing regimen and home exercises, discussed which activities to do and which ones to avoid, increase activity slowly.  Follow-up again in 6 to 8 weeks.  See how patient is doing after her races.

## 2022-05-28 NOTE — Patient Instructions (Signed)
Meloxicam for 5 days Ice after activity Avoid crunches and scissors Wear compression  See me in 6-8 weeks

## 2022-07-15 ENCOUNTER — Ambulatory Visit: Payer: Managed Care, Other (non HMO) | Admitting: Family Medicine

## 2022-07-21 ENCOUNTER — Ambulatory Visit: Payer: Managed Care, Other (non HMO) | Admitting: Family Medicine

## 2022-07-28 ENCOUNTER — Ambulatory Visit: Payer: Managed Care, Other (non HMO) | Admitting: Family Medicine

## 2022-08-07 ENCOUNTER — Telehealth: Payer: Self-pay | Admitting: Anesthesiology

## 2022-08-07 ENCOUNTER — Emergency Department (HOSPITAL_BASED_OUTPATIENT_CLINIC_OR_DEPARTMENT_OTHER): Payer: Managed Care, Other (non HMO)

## 2022-08-07 ENCOUNTER — Emergency Department (HOSPITAL_BASED_OUTPATIENT_CLINIC_OR_DEPARTMENT_OTHER)
Admission: EM | Admit: 2022-08-07 | Discharge: 2022-08-07 | Disposition: A | Discharge status: Home / Self Care | Payer: Managed Care, Other (non HMO) | Attending: Emergency Medicine | Admitting: Emergency Medicine

## 2022-08-07 ENCOUNTER — Other Ambulatory Visit: Payer: Self-pay

## 2022-08-07 DIAGNOSIS — Z8673 Personal history of transient ischemic attack (TIA), and cerebral infarction without residual deficits: Secondary | ICD-10-CM | POA: Diagnosis not present

## 2022-08-07 DIAGNOSIS — Z7982 Long term (current) use of aspirin: Secondary | ICD-10-CM | POA: Insufficient documentation

## 2022-08-07 DIAGNOSIS — R519 Headache, unspecified: Secondary | ICD-10-CM | POA: Diagnosis present

## 2022-08-07 LAB — CBC
HCT: 39.9 % (ref 36.0–46.0)
Hemoglobin: 13.2 g/dL (ref 12.0–15.0)
MCH: 30.4 pg (ref 26.0–34.0)
MCHC: 33.1 g/dL (ref 30.0–36.0)
MCV: 91.9 fL (ref 80.0–100.0)
Platelets: 266 10*3/uL (ref 150–400)
RBC: 4.34 MIL/uL (ref 3.87–5.11)
RDW: 13.2 % (ref 11.5–15.5)
WBC: 5.5 10*3/uL (ref 4.0–10.5)
nRBC: 0 % (ref 0.0–0.2)

## 2022-08-07 LAB — BASIC METABOLIC PANEL
Anion gap: 7 (ref 5–15)
BUN: 16 mg/dL (ref 6–20)
CO2: 30 mmol/L (ref 22–32)
Calcium: 9.5 mg/dL (ref 8.9–10.3)
Chloride: 102 mmol/L (ref 98–111)
Creatinine, Ser: 0.66 mg/dL (ref 0.44–1.00)
GFR, Estimated: >60 mL/min (ref >60)
Glucose, Bld: 82 mg/dL (ref 70–99)
Potassium: 3.6 mmol/L (ref 3.5–5.1)
Sodium: 139 mmol/L (ref 135–145)

## 2022-08-07 MED ORDER — METOCLOPRAMIDE HCL 5 MG/ML IJ SOLN
10.0000 mg | Freq: Once | INTRAMUSCULAR | Status: AC
  Administered 2022-08-07: 10 mg via INTRAVENOUS
  Filled 2022-08-07: qty 2

## 2022-08-07 MED ORDER — IOHEXOL 350 MG/ML SOLN
100.0000 mL | Freq: Once | INTRAVENOUS | Status: AC | PRN
  Administered 2022-08-07: 75 mL via INTRAVENOUS

## 2022-08-07 MED ORDER — SODIUM CHLORIDE 0.9 % IV BOLUS
500.0000 mL | Freq: Once | INTRAVENOUS | Status: AC
  Administered 2022-08-07: 500 mL via INTRAVENOUS

## 2022-08-07 MED ORDER — DIPHENHYDRAMINE HCL 50 MG/ML IJ SOLN
25.0000 mg | Freq: Once | INTRAMUSCULAR | Status: AC
  Administered 2022-08-07: 25 mg via INTRAVENOUS
  Filled 2022-08-07: qty 1

## 2022-08-07 MED ORDER — KETOROLAC TROMETHAMINE 15 MG/ML IJ SOLN
15.0000 mg | Freq: Once | INTRAMUSCULAR | Status: AC
  Administered 2022-08-07: 15 mg via INTRAVENOUS
  Filled 2022-08-07: qty 1

## 2022-08-07 NOTE — Discharge Instructions (Addendum)
The workup today was overall reassuring.  As discussed, CT imaging was without any acute abnormality.  Recommend follow-up with neurology outpatient for reassessment of your symptoms.  Please do not hesitate to return to emergency department for worrisome signs and symptoms we discussed become apparent.

## 2022-08-07 NOTE — Telephone Encounter (Signed)
Called patient and advised her that Dr. Everlena Cooper had seen her note and he recommends she be seen in the ED with her history of strokes

## 2022-08-07 NOTE — ED Triage Notes (Addendum)
Patient arrives with complaints of worsening headache behind her right ear. Patient states that she is tender to touch behind her ear. No weakness/dizziness.   Hx of Fibromuscular dysplasia and strokes.

## 2022-08-07 NOTE — Telephone Encounter (Signed)
Pt left message with AN stating she has had 2 strokes in 2016. She is having a headache behind the lower part of her right ear. It feels tender to touch. States symptoms have been going on for about a week and a half but it isn't debilitating just off and on. States she would like to get checked out.

## 2022-08-07 NOTE — ED Provider Notes (Signed)
[facility] EMERGENCY DEPARTMENT AT [facility] Provider Note   CSN: [id number] Arrival date & time: 08/07/22  1440     History  Chief Complaint  Patient presents with   Headache    Rachel Golden is a 57 y.o. female.   Headache   57 year old female presents emergency department with complaints of right-sided headache.  Patient states he has a history of fibromuscular dysplasia and CVA x 2 which began with similar kind of headache.  States that headache began earlier today and is located behind right ear without radiation.  Denies any visual disturbance, gait abnormality, slurred speech, facial droop, weakness/sensory deficits in upper or lower extremities.  Denies any fever, chills, night sweats, chest pain, shortness of breath.  States has tried some at home meloxicam nightly which has helped some.  Talk to her neurologist earlier today who told her to come to the emergency department for further assessment given that they had no openings in the schedule to see her.  Past medical history significant for fibromuscular dysplasia, CVA, chronic back pain,  Home Medications Prior to Admission medications   Medication Sig Start Date End Date Taking? Authorizing Provider  aspirin EC 81 MG tablet Take 1 tablet (81 mg total) by mouth daily. 10/04/14   Nita Sickle K, DO  Cholecalciferol (VITAMIN D3) 1000 units CAPS Take 2,000 Units by mouth.    [provider]  fluorouracil (EFUDEX) 5 % cream Apply topically. 01/13/22   [provider]  Glucosamine Sulfate (SYNOVACIN PO) Glucosamine    [provider]  meloxicam (MOBIC) 7.5 MG tablet TAKE 1 TABLET BY MOUTH EVERY DAY 02/11/22   [name], DO  Probiotic Product (PROBIOTIC DAILY PO) Take by mouth.    [provider]      Allergies    Patient has no known allergies.    Review of Systems   Review of Systems  Neurological:  Positive for headaches.  All other systems reviewed and are  negative.   Physical Exam Updated Vital Signs BP 132/74   Pulse 67   Temp 97.7 F (36.5 C) (Oral)   Resp 16   Ht 5' (1.524 m)   Wt 55.8 kg   LMP 06/03/2014 (Approximate)   SpO2 98%   BMI 24.03 kg/m  Physical Exam Vitals and nursing note reviewed.  Constitutional:      General: She is not in acute distress.    Appearance: She is well-developed.  HENT:     Head: Normocephalic and atraumatic.  Eyes:     Conjunctiva/sclera: Conjunctivae normal.  Cardiovascular:     Rate and Rhythm: Normal rate and regular rhythm.     Heart sounds: No murmur heard. Pulmonary:     Effort: Pulmonary effort is normal. No respiratory distress.     Breath sounds: Normal breath sounds.  Abdominal:     Palpations: Abdomen is soft.     Tenderness: There is no abdominal tenderness.  Musculoskeletal:        General: No swelling.     Cervical back: Neck supple.  Skin:    General: Skin is warm and dry.     Capillary Refill: Capillary refill takes less than 2 seconds.  Neurological:     Mental Status: She is alert.     Comments: Alert and oriented to self, place, time and event.   Speech is fluent, clear without dysarthria or dysphasia.   Strength 5/5 in upper/lower extremities   Sensation intact in upper/lower extremities   Normal  gait.  Negative Romberg. No pronator drift.  Normal finger-to-nose and feet tapping.  CN I not tested  CN II not tested.  CN III, IV, VI PERRLA and EOMs intact bilaterally  CN V Intact sensation to sharp and light touch to the face  CN VII facial movements symmetric  CN VIII not tested  CN IX, X no uvula deviation, symmetric rise of soft palate  CN XI 5/5 SCM and trapezius strength bilaterally  CN XII Midline tongue protrusion, symmetric L/R movements   Psychiatric:        Mood and Affect: Mood normal.     ED Results / Procedures / Treatments   Labs (all labs ordered are listed, but only abnormal results are displayed) Labs Reviewed  BASIC METABOLIC  PANEL  CBC    EKG None  Radiology CT ANGIO HEAD NECK W WO CM  Result Date: 08/07/2022 CLINICAL DATA:  Neuro deficit, acute, stroke suspected. Worsening headache behind right ear. EXAM: CT HEAD WITHOUT CONTRAST CT ANGIOGRAPHY OF THE HEAD AND NECK TECHNIQUE: Contiguous axial images were obtained from the base of the skull through the vertex without intravenous contrast. Multidetector CT imaging of the head and neck was performed using the standard protocol during bolus administration of intravenous contrast. Multiplanar CT image reconstructions and MIPs were obtained to evaluate the vascular anatomy. Carotid stenosis measurements (when applicable) are obtained utilizing NASCET criteria, using the distal internal carotid diameter as the denominator. RADIATION DOSE REDUCTION: This exam was performed according to the departmental dose-optimization program which includes automated exposure control, adjustment of the mA and/or kV according to patient size and/or use of iterative reconstruction technique. CONTRAST:  75mL OMNIPAQUE IOHEXOL 350 MG/ML SOLN COMPARISON:  MRA head/neck 01/26/2022. FINDINGS: CT HEAD Brain: No acute intracranial hemorrhage. Old lacunar infarct in the right cerebellar hemisphere. Gray-white differentiation is otherwise preserved. No hydrocephalus or extra-axial collection. No mass effect or midline shift. Vascular: No hyperdense vessel or unexpected calcification. Skull: No calvarial fracture or suspicious bone lesion. Skull base is unremarkable. Sinuses/Orbits: Unremarkable. CTA NECK Aortic arch: Aortic arch is incompletely imaged. Visualized portions of the proximal arch vessels are patent. 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 Vertebral arteries:Unchanged mild luminal irregularity and tortuosity of the distal vertebral arteries with pseudoaneurysm of the left V3 segment, measuring up to 6 mm (coronal image 140 series 5). Skeleton: Unremarkable. Other neck: Unremarkable. CTA HEAD Anterior circulation: Intracranial ICAs are patent without stenosis or aneurysm. The proximal ACAs and MCAs are patent without stenosis or aneurysm. Distal branches are symmetric. Posterior circulation: Normal basilar artery. The SCAs, AICAs and PICAs are patent proximally. The PCAs are patent proximally without stenosis or aneurysm. Distal branches are symmetric. Venous sinuses: As permitted by early phase of contrast, patent. Anatomic variants: Hypoplastic A1 segment of the right ACA. IMPRESSION: 1. No acute intracranial abnormality. Old lacunar infarct in the right cerebellar hemisphere. 2. No emergent large vessel occlusion or hemodynamically significant stenosis. 3. 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 Electronically Signed   By: Orvan Falconer M.D.   On: 08/07/2022 18:20   CT HEAD WO CONTRAST ( )  Result Date: 08/07/2022 CLINICAL DATA:  Neuro deficit, acute, stroke suspected. Worsening headache behind right ear. EXAM: CT HEAD WITHOUT CONTRAST CT ANGIOGRAPHY OF THE HEAD AND NECK TECHNIQUE: Contiguous axial images were obtained from the base of the skull through the vertex without intravenous contrast. Multidetector CT imaging of the head and neck was performed using the standard protocol during bolus administration of intravenous contrast. Multiplanar CT image reconstructions and MIPs were obtained to evaluate the vascular anatomy. Carotid stenosis measurements (when applicable) are obtained utilizing NASCET criteria, using the distal internal carotid  diameter as the denominator. RADIATION DOSE REDUCTION: This exam was performed according to the departmental dose-optimization program which includes automated exposure control, adjustment of the mA and/or kV according to patient size and/or use of iterative reconstruction technique. CONTRAST:  75mL OMNIPAQUE IOHEXOL 350 MG/ML SOLN COMPARISON:  MRA head/neck 01/26/2022. FINDINGS: CT HEAD Brain: No acute intracranial hemorrhage. Old lacunar infarct in the right cerebellar hemisphere. Gray-white differentiation is otherwise preserved. No hydrocephalus or extra-axial collection. No mass effect or midline shift. Vascular: No hyperdense vessel or unexpected calcification. Skull: No calvarial fracture or suspicious bone lesion. Skull base is unremarkable. Sinuses/Orbits: Unremarkable. CTA NECK Aortic arch: Aortic arch is incompletely imaged. Visualized portions of the proximal arch vessels are patent. 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 Vertebral arteries:Unchanged mild luminal irregularity and tortuosity of the distal vertebral arteries with pseudoaneurysm of the left V3 segment, measuring up to 6 mm (coronal image 140 series 5). Skeleton: Unremarkable. Other neck: Unremarkable. CTA HEAD Anterior circulation: Intracranial ICAs are patent without stenosis or aneurysm. The proximal ACAs and MCAs are patent without stenosis or aneurysm. Distal branches are symmetric. Posterior circulation: Normal basilar artery. The SCAs, AICAs and PICAs are patent proximally. The PCAs are patent proximally without stenosis or aneurysm. Distal branches are symmetric. Venous sinuses: As permitted by early  phase of contrast, patent. Anatomic variants: Hypoplastic A1 segment of the right ACA. IMPRESSION: 1. No acute intracranial abnormality. Old lacunar infarct in the right cerebellar hemisphere. 2. No emergent large vessel occlusion or hemodynamically significant stenosis. 3. 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 Electronically Signed   By: Orvan Falconer M.D.   On: 08/07/2022 18:20    Procedures Procedures    Medications Ordered in ED Medications  sodium chloride 0.9 % bolus 500 mL (0 mLs Intravenous Stopped 08/07/22 1807)  metoCLOPramide (REGLAN) injection 10 mg (10 mg Intravenous Given 08/07/22 1658)  diphenhydrAMINE (BENADRYL) injection 25 mg (25 mg Intravenous Given 08/07/22 1656)  ketorolac (TORADOL) 15 MG/ML injection 15 mg (15 mg Intravenous Given 08/07/22 1658)  iohexol (OMNIPAQUE) 350 MG/ML injection 100 mL (75 mLs Intravenous Contrast Given 08/07/22 1747)    ED Course/ Medical Decision Making/ A&P                             Medical Decision Making Amount and/or Complexity of Data Reviewed Labs: ordered. Radiology: ordered.  Risk Prescription drug management.   This patient presents to the ED for concern of headache, this involves an extensive number of treatment options, and  is a complaint that carries with it a high risk of complications and morbidity.  The differential diagnosis includes CVA, cerebral venous thrombosis, carotid/vertebral artery dissection, malignancy, electrolyte derangement, meningitis/encephalitis, tension/cluster/migraine headache   Co morbidities that complicate the patient evaluation  See HPI   Additional history obtained:  Additional history obtained from EMR External records from outside source obtained and reviewed including hospital records   Lab Tests:  I Ordered, and personally interpreted labs.  The pertinent  results include: No leukocytosis.  No evidence anemia.  Platelets within range.  No electrolyte abnormalities.  No renal.   Imaging Studies ordered:  I ordered imaging studies including CT head, CT angio head and neck I independently visualized and interpreted imaging which showed  CT head/CT angio head and neck: No acute intracranial abnormality.  No emergent large vessel occlusion or hemodynamic significant stenosis.  Unchanged findings of fibromuscular dysplasia involving bilateral internal carotid arteries and distal vertebral arteries with pseudoaneurysm at base of skull I agree with the radiologist interpretation  Cardiac Monitoring: / EKG:  The patient was maintained on a cardiac monitor.  I personally viewed and interpreted the cardiac monitored which showed an underlying rhythm of: Sinus rhythm   Consultations Obtained:  N/a   Problem List / ED Course / Critical interventions / Medication management  Headache I ordered medication including Benadryl, Reglan, Toradol, 500 cc of normal saline   Reevaluation of the patient after these medicines showed that the patient improved I have reviewed the patients home medicines and have made adjustments as needed   Social Determinants of Health:  Denies tobacco or illicit drug use   Test / Admission - Considered:  Headache Vitals signs within normal range and stable throughout visit. Laboratory/imaging studies significant for: See above 57 year old female presents emergency department with complaints of headache.  Patient without any acute neurologic deficit but given reported history of nontypical headaches in the past with similar distribution associated with CVA, CT imaging was pursued.  CT imaging was negative for any acute abnormality.  Patient noted resolution of headache with administration of migraine cocktail.  Patient reassured by overall negative findings.  Recommended follow-up with neurology outpatient for reassessment  of symptoms.  Treatment plan discussed at length with patient and she acknowledged understanding was agreeable to said plan. Worrisome signs and symptoms were discussed with the patient, and the patient acknowledged understanding to return to the ED if noticed. Patient was stable upon discharge.          Final Clinical Impression(s) / ED Diagnoses Final diagnoses:  Nonintractable headache, unspecified chronicity pattern, unspecified headache type    Rx / DC Orders ED Discharge Orders     None         Peter Garter, [place] 08/07/22 1853    Melene Plan, DO 08/07/22 1934

## 2022-08-10 ENCOUNTER — Telehealth: Payer: Self-pay | Admitting: Anesthesiology

## 2022-08-10 NOTE — Telephone Encounter (Signed)
Please schedule follow-up visit for migraines, as this is a new symptoms.  OK to add on 5/21.  Thank you.

## 2022-08-10 NOTE — Telephone Encounter (Signed)
Pt called stating she had an ER visit on May 3rd, was advised to  get a Rx for migraine headaches. States her appointment is not until September/2024. States can Dr Everlena Cooper prescribe something for her? Does she need to come in for an appointment sooner?

## 2022-08-11 NOTE — Telephone Encounter (Signed)
Called patient and informed her of Dr. Eliane Decree recommendations and advice. Patient stated that she will try the tylenol and states she is feeling a little better. Patient had no further questions or concerns.

## 2022-08-11 NOTE — Telephone Encounter (Signed)
Since she is already on aspirin, I would avoid excedrin and see if tylenol 500mg  provides any relief.  If not, she can pick up samples of Nurtec to take for severe headache (limit to twice per week).

## 2022-08-25 ENCOUNTER — Encounter: Payer: Self-pay | Admitting: Neurology

## 2022-08-25 ENCOUNTER — Ambulatory Visit: Payer: Managed Care, Other (non HMO) | Admitting: Neurology

## 2022-08-25 VITALS — BP 113/64 | HR 66 | Ht 60.0 in | Wt 124.0 lb

## 2022-08-25 DIAGNOSIS — I773 Arterial fibromuscular dysplasia: Secondary | ICD-10-CM

## 2022-08-25 DIAGNOSIS — M542 Cervicalgia: Secondary | ICD-10-CM | POA: Diagnosis not present

## 2022-08-25 MED ORDER — CYCLOBENZAPRINE HCL 5 MG PO TABS: 5.0000 mg | ORAL_TABLET | Freq: Every evening | ORAL | 1 refills | Status: DC | PRN

## 2022-08-25 NOTE — Patient Instructions (Addendum)
It was great to see you today!  If your neck starts to bother you again, you can take flexeril 5mg  at bedtime  Return to clinic in 1 year

## 2022-08-25 NOTE — Progress Notes (Signed)
Follow-up Visit   Date: 08/25/22    Rachel Golden MRN: [id number] DOB: 17-Jan-1966   Interim History: Rachel Golden is a 57 y.o. right-handed Caucasian female with chronic back pain, fibromuscular dysplasia and stroke for follow-up of FMD. The patient was accompanied to the clinic by self.  IMPRESSION/PLAN: Cervicalgia, improved.  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  No neurological deficits or residual symptoms. 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  Imaging was personally viewed. She also FMD changes involving the right renal and superior mesenteric arteries for which she was evaluated by Dr. Randie Heinz, Vascular Surgery.  Blood pressure is normal.  - Continue aspirin 81mg  daily  Return to clinic in 1 year   -------------------------------------------------------------- History of present illness: On May 13th 2016, she was picking up a hanger off the floor and developed acute onset of dizziness.  She initially thought it was because she got up too quickly.  She then heard a "swooshing" sound in her head and developed a piercing headache. Swooshing sound resolved within 5 minutes.  Within 30-minutes, she developed right hand tingling and numbness of the lips.  She called 911 who evaluated her and did not feel that her symptoms were consistent with a stroke, so was not taken to the emergency department.  She also recalls having neck pain especially with neck rotation to the right. On May 19th, she was getting out of the car and developed neck stiffness and heard a swooshing sound in her  right ear with associated brief but severe headache.  She also has associated vision disturbance described as "kelidoscope" vision, dizzy, and nauseous. She had difficulty walking out of her car to her home, but was able to make it into her home slowly.  Symptoms lasted about 10-minutes.   She again went to see Dr. Katrinka Blazing on the same day who ordered MRI/A of her head.  Imaging shows small infarcts involving the right cerebellum and medial right parieto-occipital region. Vessel imaging was shows vascular irregularity involving bilateral vertebral arteries with ?right dissection, concerning for fibromuscular dysplasia but I do not appreciate this well on my review.  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  Her stroke work-up has been returning largely normal including echocardiogram, holter monitor, and vasculitis/hypercoagulable labs. She was very happy with her evaluation with Dr. Conchita Paris who suggested that only if symptoms worsen or she develops new headache, would cerebral angiogram be indicated.    She was started on aspirin 325mg  and lipitor 20mg  daily.  No personal or family history of stroke.  No personal history of hypertension.  Of note, she has received cervical manipulation for stiffness this month, which alleviated her neck discomfort.  UPDATE 12/31/2021:  She has been doing great over the past year.  No new neurological complaints.  She saw Dr. Randie Heinz with Vascular Surgery after CTA abodmen showed FMD changes involving the right renal and superior mesenteric arteries.  She was recommended to have repeat imaging in 2024.  She will be doing [place] in November and travelling to [place] during Christmas.  UPDATE 08/25/2022:  Starting around late April, she began having  achy pain at the base of her right neck/ear.  Pain was worse with neck rotation.  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  She went to the ER where she was given  a headache cocktail which resolved her pain.  The next two days, she took tylenol, but has not had recurrence of pain.  She denies photophobia, phonophobia, nausea/vomiting.  CTA neck shows stable changes with FMD.    Medications:  Current Outpatient Medications on File Prior to Visit  Medication Sig Dispense Refill   aspirin EC 81 MG tablet Take 1 tablet (81 mg total) by mouth daily.     Cholecalciferol (VITAMIN D3) 1000 units CAPS Take 2,000 Units by mouth.     fluorouracil (EFUDEX) 5 % cream Apply topically.     Glucosamine Sulfate (SYNOVACIN PO) Glucosamine     meloxicam (MOBIC) 7.5 MG tablet TAKE 1 TABLET BY MOUTH EVERY DAY 90 tablet 0   Probiotic Product (PROBIOTIC DAILY PO) Take by mouth.     No current facility-administered medications on file prior to visit.    Allergies: No Known Allergies   Vital Signs:  BP 113/64   Pulse 66   Ht 5' (1.524 m)   Wt 124 lb (56.2 kg)   LMP 06/03/2014 (Approximate)   SpO2 99%   BMI 24.22 kg/m   Neurological Exam: MENTAL STATUS including orientation to time, place, person, recent and remote memory, attention span and concentration, language, and fund of knowledge is normal.  Speech is not dysarthric.  CRANIAL NERVES: Pupils equal round and reactive to light.   Normal conjugate, extra-ocular eye movements in all directions of gaze.  No ptosis.  Face is symmetric.   MOTOR:  Motor strength is 5/5 in all extremities.  No pronator drift.  MSRs:  Reflexes are brisk and symmetric 3+/4 throughout.  SENSORY:  Intact to vibration.  COORDINATION/GAIT:    Gait narrow based and stable.   Data: Labs 08/23/2014:  CRP <0.1, ESR 12, ANA neg, HbA1c 5.5, LDL 55, HDL 76, C3/C4 normal, ENA neg, ANA neg, RF neg, hypercoagulable panel negative (normal protein C and S activity, no Factor V leiden mutation, normal antithrombin III and anticardiolipin antibody)  Surface echocardiogram 08/31/2014:  normal, EF 60% no thrombus  08/30/2014  48-hr holter:   No  significant arrhythmias.  No atrial fib or etiology for an embolic neuro event.  Rare atrial and ventricular ectopy noted.  Predominant rhythm is sinus.     MRI brain 08/27/2014:  Subacute nonhemorrhagic small infarcts involving mid to inferior right cerebellum and medial aspect of the right parietal -occipital lobe.  No intracranial hemorrhage.  MRA head 08/27/2014:  Ectatic cavernous segment internal carotid artery bilaterally. Mild narrowing petrous segment of the internal carotid artery bilaterally. Middle cerebral mild to moderate branch vessel narrowing and irregularity bilaterally greater on the right.   1.6 x 1.6 mm bulge M1 segment right middle cerebral artery appears to be origin of a vessel rather than saccular aneurysm. Stability can be confirmed on follow-up.   Moderate narrowing at the junction of the A1 segment and A2 segment of the right anterior cerebral artery.  Ectatic vertebral arteries and basilar artery without high-grade stenosis.  Full extent of the left posterior inferior cerebellar artery not imaged on present exam. This is noted on the contrast-enhanced MR angiogram of the neck and appears ectatic were proximal narrowing.  Nonvisualized left anterior inferior cerebellar artery.  Narrowing and irregularity superior cerebellar artery  greater on the left.  Posterior cerebral artery branch vessel narrowing and irregularity greater on the left.   MRA NECK FINDINGS Ectatic pleated dilated appearance of the vertebral arteries bilaterally suggestive of fibromuscular dysplasia. With the patient's subacute right sided infarcts, subtle focal dissection with subsequent embolic thrombus cannot be entirely excluded although not detected on the current exam   No significant stenosis of either carotid bifurcation. Ectatic internal carotid artery cervical segment bilaterally may also reflect changes of fibromuscular dysplasia but without pleated appearance as noted involving the vertebral  arteries.   MRA head 05/03/2015: 1. 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 No associated stenosis. The bilateral V4 segments appear spared. 2. Bilateral ICA dolichoectasia, without the cervical ICA beaded appearance typical of carotid FMD. No carotid stenosis. 3.  Negative intracranial MRA aside from ICA siphon tortuosity.  MRA head and neck 06/20/2018 performed at [facility]: 1.  Multifocal irregularity of the V2 and V3 segments of both vertebral arteries, which may relate to fibromuscular dysplasia.  No evidence of significant stenosis or occlusion.  Prior outside imaging 127/2017 is not available for direct comparison to assess for progression of these findings. 2.  Extensive tortuosity of the cervical and cavernous bilateral internal carotid arteries, which is nonspecific but can be seen with longstanding hypertension. 3.  No evidence of significant proximal artery narrowing in the head or neck.  MRA head and neck 12/11/2020: 1. Multifocal beading and irregularity involving the bilateral V1 and V2 segments as well as the distal V3 segments, consistent with vertebral artery fibromuscular dysplasia. Findings have progressed as compared to previous MRA from 2017, with at least moderate associated stenoses involving the proximal V2 segments now seen,right slightly worse than left. 2. 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 No carotid stenosis. 3. Otherwise stable and negative intracranial MRA.  CTA head and neck 08/07/2022: 1. No acute intracranial abnormality. Old lacunar infarct in the right cerebellar hemisphere. 2. No emergent large vessel occlusion or hemodynamically significant stenosis. 3. 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    Thank you for allowing me to participate in patient's care.  If I can answer any additional questions, I would be pleased to do so.    Sincerely,    Aydyn Testerman K. Allena Katz, DO

## 2022-09-01 NOTE — Progress Notes (Unsigned)
[name] [address] [place] [address] Phone: [phone] Subjective:    I'm seeing this patient by the request  of:  Jarrett Soho, PA-C  CC: Back pain  [id number]  Rachel Golden is a 57 y.o. female coming in with complaint of back  pain. OMT on 05/28/2022. Patient states that she has 3 minor things that she wants to review. Has increased her mileage and her left ankle will feel weak no pain when she is running just a tweak here and there. Right knee medial knee pain intermittent, resolves when she ices and ibu. R hip pops but she has no pain    Medications patient has been prescribed:   Taking:         Reviewed prior external information including notes and imaging from previsou exam, outside providers and external EMR if available.   As well as notes that were available from care everywhere and other healthcare systems.  Past medical history, social, surgical and family history all reviewed in electronic medical record.  No pertanent information unless stated regarding to the chief complaint.   Past Medical History:  Diagnosis Date   Chronic back pain    Fibromuscular dysplasia (HCC)    Sleep apnea    Stroke (HCC)     No Known Allergies   Review of Systems:  No headache, visual changes, nausea, vomiting, diarrhea, constipation, dizziness, abdominal pain, skin rash, fevers, chills, night sweats, weight loss, swollen lymph nodes, body aches, joint swelling, chest pain, shortness of breath, mood changes. POSITIVE muscle aches  Objective  Blood pressure 122/82, pulse 80, height 5' (1.524 m), weight 126 lb (57.2 kg), last menstrual period 06/03/2014, SpO2 99 %.   General: No apparent distress alert and oriented x3 mood and affect normal, dressed appropriately.  HEENT: Pupils equal, extraocular movements intact  Respiratory: Patient's speak in full sentences and does not appear short of breath  Cardiovascular: No lower  extremity edema, non tender, no erythema  Back exam does have some loss of lordosis noted.  Tightness noted with FABER test.  Right knee does have some mild tenderness on the medial aspect.  Osteopathic findings  C3 flexed rotated and side bent right C7 flexed rotated and side bent left T3 extended rotated and side bent right inhaled rib T7 extended rotated and side bent left L3 flexed rotated and side bent right Sacrum right on right       Assessment and Plan:  Lumbar back pain Low back exam does have some loss lordosis noted.  Some tenderness to palpation of the paraspinal musculature.  Avoided the neck again.  Patient is continuing to be very active and hopefully will continue to do so.  Follow-up again in 6 to 8 weeks.    Nonallopathic problems  Decision today to treat with OMT was based on Physical Exam  After verbal consent patient was treated with HVLA, ME, FPR techniques in rib, thoracic, lumbar, and sacral  areas  Patient tolerated the procedure well with improvement in symptoms  Patient given exercises, stretches and lifestyle modifications  See medications in patient instructions if given  Patient will follow up in 4-8 weeks    The above documentation has been reviewed and is accurate and complete [name], DO          Note: This dictation was prepared with Dragon dictation along with smaller phrase technology. Any transcriptional errors that result from this process are unintentional.

## 2022-09-03 ENCOUNTER — Ambulatory Visit: Payer: Managed Care, Other (non HMO) | Admitting: Family Medicine

## 2022-09-03 VITALS — BP 122/82 | HR 80 | Ht 60.0 in | Wt 126.0 lb

## 2022-09-03 DIAGNOSIS — M9902 Segmental and somatic dysfunction of thoracic region: Secondary | ICD-10-CM

## 2022-09-03 DIAGNOSIS — M9904 Segmental and somatic dysfunction of sacral region: Secondary | ICD-10-CM

## 2022-09-03 DIAGNOSIS — M9908 Segmental and somatic dysfunction of rib cage: Secondary | ICD-10-CM | POA: Diagnosis not present

## 2022-09-03 DIAGNOSIS — M9903 Segmental and somatic dysfunction of lumbar region: Secondary | ICD-10-CM

## 2022-09-03 DIAGNOSIS — M545 Low back pain, unspecified: Secondary | ICD-10-CM

## 2022-09-03 DIAGNOSIS — M542 Cervicalgia: Secondary | ICD-10-CM

## 2022-09-03 MED ORDER — TIZANIDINE HCL 2 MG PO TABS: 2.0000 mg | ORAL_TABLET | Freq: Every day | ORAL | 0 refills | Status: DC

## 2022-09-03 NOTE — Patient Instructions (Signed)
Make sure you use the oofos  Keep stretching the hips after and the hip flexor stretches we talked about  2 month follow up

## 2022-09-03 NOTE — Assessment & Plan Note (Addendum)
Low back exam does have some loss lordosis noted.  Some tenderness to palpation of the paraspinal musculature.  Avoided the neck again.  Patient is continuing to be very active and hopefully will continue to do so.  Follow-up again in 6 to 8 weeks.  Patient recently had some neck pain as well still avoided the manipulation but given Zanaflex to take at night if needed.  Was having difficulty with the Flexeril given by neurology.

## 2022-09-25 ENCOUNTER — Other Ambulatory Visit: Payer: Self-pay | Admitting: Family Medicine

## 2022-10-30 ENCOUNTER — Ambulatory Visit: Payer: Managed Care, Other (non HMO) | Admitting: Family Medicine

## 2022-11-13 ENCOUNTER — Ambulatory Visit: Payer: Managed Care, Other (non HMO) | Admitting: Family Medicine

## 2022-11-18 NOTE — Telephone Encounter (Signed)
See other encounter from 08/10/2022.

## 2022-11-19 ENCOUNTER — Ambulatory Visit: Payer: Managed Care, Other (non HMO) | Admitting: Family Medicine

## 2022-12-09 ENCOUNTER — Ambulatory Visit: Payer: Managed Care, Other (non HMO) | Admitting: Family Medicine

## 2022-12-21 ENCOUNTER — Ambulatory Visit: Payer: Managed Care, Other (non HMO) | Admitting: Family Medicine

## 2022-12-21 ENCOUNTER — Encounter: Payer: Self-pay | Admitting: Family Medicine

## 2022-12-21 ENCOUNTER — Other Ambulatory Visit: Payer: Self-pay

## 2022-12-21 VITALS — BP 108/78 | HR 60 | Ht 60.0 in | Wt 124.0 lb

## 2022-12-21 DIAGNOSIS — M9903 Segmental and somatic dysfunction of lumbar region: Secondary | ICD-10-CM | POA: Diagnosis not present

## 2022-12-21 DIAGNOSIS — M545 Low back pain, unspecified: Secondary | ICD-10-CM

## 2022-12-21 DIAGNOSIS — S83241A Other tear of medial meniscus, current injury, right knee, initial encounter: Secondary | ICD-10-CM

## 2022-12-21 DIAGNOSIS — M25561 Pain in right knee: Secondary | ICD-10-CM | POA: Diagnosis not present

## 2022-12-21 DIAGNOSIS — M9902 Segmental and somatic dysfunction of thoracic region: Secondary | ICD-10-CM | POA: Diagnosis not present

## 2022-12-21 DIAGNOSIS — M9904 Segmental and somatic dysfunction of sacral region: Secondary | ICD-10-CM | POA: Diagnosis not present

## 2022-12-21 NOTE — Patient Instructions (Signed)
Ice knee after activity Stretch ankle before you get up See me in 6 weeks

## 2022-12-21 NOTE — Assessment & Plan Note (Signed)
Chronic discomfort with some mild tenderness to palpation noted.  Some limited range of motion in certain planes.  Will continue to work on posture and ergonomics but overall nothing that should stop her.  Follow-up again in 2 to 3 months

## 2022-12-21 NOTE — Progress Notes (Signed)
[name] [address] [place] [address] Phone: [phone] Subjective:   Rachel Golden, am serving as a scribe for Dr. Antoine Primas.  I'm seeing this patient by the request  of:  Jarrett Soho, PA-C  CC: right knee and big toe pain   [id number]  Rachel Golden is a 57 y.o. female coming in with complaint of L ankle, R knee pain. Patient states that her L ankle seems weak when she wakes up in the morning. No pain with activity.   R knee pain over medial aspect. Feels like knee is bruised but not all the time. Able to run without pain but wants to make sure that she is ok to race in November.   Tension in her neck. Traveled from [facility]       Past Medical History:  Diagnosis Date   Chronic back pain    Fibromuscular dysplasia (HCC)    Sleep apnea    Stroke [facility])    Past Surgical History:  Procedure Laterality Date   NASAL SEPTUM SURGERY     Social History   Socioeconomic History   Marital status: Married    Spouse name: Not on file   Number of children: 2   Years of education: 43   Highest education level: Not on file  Occupational History   Not on file  Tobacco Use   Smoking status: Never   Smokeless tobacco: Never  Vaping Use   Vaping status: Never Used  Substance and Sexual Activity   Alcohol use: Yes    Alcohol/week: 0.0 standard drinks of alcohol    Comment: 3-4 drinks per week, on occasion   Drug use: No   Sexual activity: Not on file  Other Topics Concern   Not on file  Social History Narrative   Lives with husband and 2 children in a 2 story home.     Works as an Airline pilot part time.     Education: BS degree.   Right handed   Drinks caffeine   Social Determinants of Health   Financial Resource Strain: Not on file  Food Insecurity: Not on file  Transportation Needs: Not on file  Physical Activity: Not on file  Stress: Not on file  Social Connections: Not on file   No Known  Allergies Family History  Problem Relation Age of Onset   Heart murmur Mother        Living, 17   Alzheimer's disease Father        Living, 13   Vascular Disease Brother    Healthy Son    Healthy Daughter        Current Outpatient Medications (Analgesics):    aspirin EC 81 MG tablet, Take 1 tablet (81 mg total) by mouth daily.   meloxicam (MOBIC) 7.5 MG tablet, TAKE 1 TABLET BY MOUTH EVERY DAY   Current Outpatient Medications (Other):    Cholecalciferol (VITAMIN D3) 1000 units CAPS, Take 2,000 Units by mouth.   cyclobenzaprine (FLEXERIL) 5 MG tablet, Take 1 tablet (5 mg total) by mouth at bedtime as needed for muscle spasms.   fluorouracil (EFUDEX) 5 % cream, Apply topically.   Glucosamine Sulfate (SYNOVACIN PO), Glucosamine   Probiotic Product (PROBIOTIC DAILY PO), Take by mouth.   tiZANidine (ZANAFLEX) 2 MG tablet, TAKE 1 TABLET BY MOUTH AT BEDTIME.   Reviewed prior external information including notes and imaging from  primary care provider As well as notes that were available from care everywhere  and other healthcare systems.  Past medical history, social, surgical and family history all reviewed in electronic medical record.  No pertanent information unless stated regarding to the chief complaint.   Review of Systems:  No headache, visual changes, nausea, vomiting, diarrhea, constipation, dizziness, abdominal pain, skin rash, fevers, chills, night sweats, weight loss, swollen lymph nodes, body aches, joint swelling, chest pain, shortness of breath, mood changes. POSITIVE muscle aches  Objective  Blood pressure 108/78, pulse 60, height 5' (1.524 m), weight 124 lb (56.2 kg), last menstrual period 06/03/2014, SpO2 100%.   General: No apparent distress alert and oriented x3 mood and affect normal, dressed appropriately.  HEENT: Pupils equal, extraocular movements intact  Respiratory: Patient's speak in full sentences and does not appear short of breath  Cardiovascular: No  lower extremity edema, non tender, no erythema  Knee exam does have tightness noted overall.  No significant decreasing range of motion.  Seems to be more on the medial aspect of the knee.  No swelling in the popliteal area. Foot exam does not have any significant breakdown at the moment.  Limited muscular skeletal ultrasound was performed and interpreted by Antoine Primas, M  Limited ultrasound of patient's knee does not show any specific findings.  Has some mild increase in Doppler flow around the medial meniscus but no true acute tear appreciated.  No significant swelling otherwise.  Osteopathic findings T9 extended rotated and side bent left L2 flexed rotated and side bent right L4 flexed rotated and side bent left Sacrum right on right     Impression and Recommendations:    The above documentation has been reviewed and is accurate and complete [name], DO

## 2022-12-21 NOTE — Assessment & Plan Note (Signed)
Still has some increase in Doppler flow in the area but nothing specific.  Discussed icing regimen and home exercises, discussed avoiding certain activities.  Follow-up with me again in 6 to 8 weeks

## 2022-12-25 ENCOUNTER — Ambulatory Visit: Payer: Managed Care, Other (non HMO) | Admitting: Family Medicine

## 2023-01-04 ENCOUNTER — Ambulatory Visit: Payer: Managed Care, Other (non HMO) | Admitting: Neurology

## 2023-02-09 ENCOUNTER — Other Ambulatory Visit: Payer: Self-pay | Admitting: *Deleted

## 2023-02-09 DIAGNOSIS — R0989 Other specified symptoms and signs involving the circulatory and respiratory systems: Secondary | ICD-10-CM

## 2023-02-09 DIAGNOSIS — I773 Arterial fibromuscular dysplasia: Secondary | ICD-10-CM

## 2023-02-09 DIAGNOSIS — I701 Atherosclerosis of renal artery: Secondary | ICD-10-CM

## 2023-02-11 NOTE — Progress Notes (Deleted)
[name] [address] [place] [address] Phone: [phone] Subjective:    I'm seeing this patient by the request  of:  Jarrett Soho, PA-C  CC:   [id number]  Rachel Golden is a 57 y.o. female coming in with complaint of neck pain. Last seen on 12/21/2022 for knee pain and OMT. Patient states        Past Medical History:  Diagnosis Date   Chronic back pain    Fibromuscular dysplasia (HCC)    Sleep apnea    Stroke [facility])    Past Surgical History:  Procedure Laterality Date   NASAL SEPTUM SURGERY     Social History   Socioeconomic History   Marital status: Married    Spouse name: Not on file   Number of children: 2   Years of education: 10   Highest education level: Not on file  Occupational History   Not on file  Tobacco Use   Smoking status: Never   Smokeless tobacco: Never  Vaping Use   Vaping status: Never Used  Substance and Sexual Activity   Alcohol use: Yes    Alcohol/week: 0.0 standard drinks of alcohol    Comment: 3-4 drinks per week, on occasion   Drug use: No   Sexual activity: Not on file  Other Topics Concern   Not on file  Social History Narrative   Lives with husband and 2 children in a 2 story home.     Works as an Airline pilot part time.     Education: BS degree.   Right handed   Drinks caffeine   Social Determinants of Health   Financial Resource Strain: Not on file  Food Insecurity: Not on file  Transportation Needs: Not on file  Physical Activity: Not on file  Stress: Not on file  Social Connections: Not on file   No Known Allergies Family History  Problem Relation Age of Onset   Heart murmur Mother        Living, 2   Alzheimer's disease Father        Living, 34   Vascular Disease Brother    Healthy Son    Healthy Daughter        Current Outpatient Medications (Analgesics):    aspirin EC 81 MG tablet, Take 1 tablet (81 mg total) by mouth daily.   meloxicam (MOBIC) 7.5 MG  tablet, TAKE 1 TABLET BY MOUTH EVERY DAY   Current Outpatient Medications (Other):    Cholecalciferol (VITAMIN D3) 1000 units CAPS, Take 2,000 Units by mouth.   cyclobenzaprine (FLEXERIL) 5 MG tablet, Take 1 tablet (5 mg total) by mouth at bedtime as needed for muscle spasms.   fluorouracil (EFUDEX) 5 % cream, Apply topically.   Glucosamine Sulfate (SYNOVACIN PO), Glucosamine   Probiotic Product (PROBIOTIC DAILY PO), Take by mouth.   tiZANidine (ZANAFLEX) 2 MG tablet, TAKE 1 TABLET BY MOUTH AT BEDTIME.   Reviewed prior external information including notes and imaging from  primary care provider As well as notes that were available from care everywhere and other healthcare systems.  Past medical history, social, surgical and family history all reviewed in electronic medical record.  No pertanent information unless stated regarding to the chief complaint.   Review of Systems:  No headache, visual changes, nausea, vomiting, diarrhea, constipation, dizziness, abdominal pain, skin rash, fevers, chills, night sweats, weight loss, swollen lymph nodes, body aches, joint swelling, chest pain, shortness of breath, mood changes. POSITIVE muscle aches  Objective  Last menstrual period 06/03/2014.   General: No apparent distress alert and oriented x3 mood and affect normal, dressed appropriately.  HEENT: Pupils equal, extraocular movements intact  Respiratory: Patient's speak in full sentences and does not appear short of breath  Cardiovascular: No lower extremity edema, non tender, no erythema      Impression and Recommendations:

## 2023-02-12 NOTE — Progress Notes (Unsigned)
[name] [address] [place] [address] Phone: [phone] Subjective:   Rachel Golden, am serving as a scribe for Dr. Antoine Primas.  I'm seeing this patient by the request  of:  Jarrett Soho, PA-C  CC: Right knee and back pain follow-up  EPP:[id number]  Rachel Golden is a 57 y.o. female coming in with complaint of neck pain. Seen on 12/21/2022 for R knee and OMT. Patient states that she is running a 1/2 marathon on Saturday. Feeling good though in lumbar and cervical spine.  R knee pain has subsided. Able to run today without pain. Did have a sharp pain when she knelt on knee the other day.      Past Medical History:  Diagnosis Date   Chronic back pain    Fibromuscular dysplasia (HCC)    Sleep apnea    Stroke [facility])    Past Surgical History:  Procedure Laterality Date   NASAL SEPTUM SURGERY     Social History   Socioeconomic History   Marital status: Married    Spouse name: Not on file   Number of children: 2   Years of education: 75   Highest education level: Not on file  Occupational History   Not on file  Tobacco Use   Smoking status: Never   Smokeless tobacco: Never  Vaping Use   Vaping status: Never Used  Substance and Sexual Activity   Alcohol use: Yes    Alcohol/week: 0.0 standard drinks of alcohol    Comment: 3-4 drinks per week, on occasion   Drug use: No   Sexual activity: Not on file  Other Topics Concern   Not on file  Social History Narrative   Lives with husband and 2 children in a 2 story home.     Works as an Airline pilot part time.     Education: BS degree.   Right handed   Drinks caffeine   Social Determinants of Health   Financial Resource Strain: Not on file  Food Insecurity: Not on file  Transportation Needs: Not on file  Physical Activity: Not on file  Stress: Not on file  Social Connections: Not on file   No Known Allergies Family History  Problem Relation Age of Onset   Heart  murmur Mother        Living, 27   Alzheimer's disease Father        Living, 57   Vascular Disease Brother    Healthy Son    Healthy Daughter        Current Outpatient Medications (Analgesics):    aspirin EC 81 MG tablet, Take 1 tablet (81 mg total) by mouth daily.   meloxicam (MOBIC) 7.5 MG tablet, TAKE 1 TABLET BY MOUTH EVERY DAY   Current Outpatient Medications (Other):    Cholecalciferol (VITAMIN D3) 1000 units CAPS, Take 2,000 Units by mouth.   cyclobenzaprine (FLEXERIL) 5 MG tablet, Take 1 tablet (5 mg total) by mouth at bedtime as needed for muscle spasms.   fluorouracil (EFUDEX) 5 % cream, Apply topically.   Glucosamine Sulfate (SYNOVACIN PO), Glucosamine   Probiotic Product (PROBIOTIC DAILY PO), Take by mouth.   tiZANidine (ZANAFLEX) 2 MG tablet, TAKE 1 TABLET BY MOUTH AT BEDTIME.   Reviewed prior external information including notes and imaging from  primary care provider As well as notes that were available from care everywhere and other healthcare systems.  Past medical history, social, surgical and family history all reviewed in electronic medical  record.  No pertanent information unless stated regarding to the chief complaint.   Review of Systems:  No headache, visual changes, nausea, vomiting, diarrhea, constipation, dizziness, abdominal pain, skin rash, fevers, chills, night sweats, weight loss, swollen lymph nodes, body aches, joint swelling, chest pain, shortness of breath, mood changes. POSITIVE muscle aches  Objective  Blood pressure 110/82, pulse 88, height 5' (1.524 m), weight 124 lb (56.2 kg), last menstrual period 06/03/2014, SpO2 98%.   General: No apparent distress alert and oriented x3 mood and affect normal, dressed appropriately.  HEENT: Pupils equal, extraocular movements intact  Respiratory: Patient's speak in full sentences and does not appear short of breath  Cardiovascular: No lower extremity edema, non tender, no erythema  Right knee exam  shows  Back exam shows loss of lordosis.  Some tightness noted in the hip noted right greater than left.  Positive Pearlean Brownie on the right  Osteopathic findings  T9 extended rotated and side bent left L3 flexed rotated and side bent right Sacrum right on right      Impression and Recommendations:      Lumbar back pain Some tightness noted.  Significantly limiting core strengthening.  Patient has been running a lot and should do very well with her thumb.  Expected to do well with this regimen and home exercises otherwise.  Follow-up again in 6 to 8 weeks.  Will be training for noted right thumb for half marathon soon afterwards.    Decision today to treat with OMT was based on Physical Exam  After verbal consent patient was treated with HVLA, ME, FPR techniques in  thoracic, rib, lumbar and sacral areas, all areas are chronic   Patient tolerated the procedure well with improvement in symptoms  Patient given exercises, stretches and lifestyle modifications  See medications in patient instructions if given  Patient will follow up in 4-8 weeks  The above documentation has been reviewed and is accurate and complete [name], DO

## 2023-02-15 ENCOUNTER — Ambulatory Visit: Payer: Managed Care, Other (non HMO) | Admitting: Family Medicine

## 2023-02-15 ENCOUNTER — Encounter: Payer: Self-pay | Admitting: Family Medicine

## 2023-02-15 VITALS — BP 110/82 | HR 88 | Ht 60.0 in | Wt 124.0 lb

## 2023-02-15 DIAGNOSIS — M9903 Segmental and somatic dysfunction of lumbar region: Secondary | ICD-10-CM

## 2023-02-15 DIAGNOSIS — M9902 Segmental and somatic dysfunction of thoracic region: Secondary | ICD-10-CM | POA: Diagnosis not present

## 2023-02-15 DIAGNOSIS — M545 Low back pain, unspecified: Secondary | ICD-10-CM

## 2023-02-15 DIAGNOSIS — M9904 Segmental and somatic dysfunction of sacral region: Secondary | ICD-10-CM

## 2023-02-15 NOTE — Patient Instructions (Signed)
Good to see you Scapular exercises See me again in 8 weeks

## 2023-02-15 NOTE — Assessment & Plan Note (Signed)
Some tightness noted.  Significantly limiting core strengthening.  Patient has been running a lot and should do very well with her thumb.  Expected to do well with this regimen and home exercises otherwise.  Follow-up again in 6 to 8 weeks.  Will be training for noted right thumb for half marathon soon afterwards.

## 2023-02-17 ENCOUNTER — Ambulatory Visit (INDEPENDENT_AMBULATORY_CARE_PROVIDER_SITE_OTHER)
Admission: RE | Admit: 2023-02-17 | Discharge: 2023-02-17 | Disposition: A | Discharge status: Home / Self Care | Payer: Managed Care, Other (non HMO) | Source: Ambulatory Visit | Attending: Vascular Surgery | Admitting: Vascular Surgery

## 2023-02-17 ENCOUNTER — Ambulatory Visit (HOSPITAL_COMMUNITY)
Admission: RE | Admit: 2023-02-17 | Discharge: 2023-02-17 | Disposition: A | Discharge status: Home / Self Care | Payer: Managed Care, Other (non HMO) | Source: Ambulatory Visit | Attending: Vascular Surgery | Admitting: Vascular Surgery

## 2023-02-17 DIAGNOSIS — R0989 Other specified symptoms and signs involving the circulatory and respiratory systems: Secondary | ICD-10-CM | POA: Diagnosis not present

## 2023-02-17 DIAGNOSIS — I773 Arterial fibromuscular dysplasia: Secondary | ICD-10-CM | POA: Diagnosis present

## 2023-02-17 DIAGNOSIS — I701 Atherosclerosis of renal artery: Secondary | ICD-10-CM | POA: Diagnosis present

## 2023-02-24 ENCOUNTER — Encounter: Payer: Self-pay | Admitting: Physician Assistant

## 2023-02-24 ENCOUNTER — Ambulatory Visit: Payer: Managed Care, Other (non HMO) | Admitting: Physician Assistant

## 2023-02-24 VITALS — BP 123/79 | HR 59 | Temp 98.7°F | Ht 60.0 in | Wt 127.0 lb

## 2023-02-24 DIAGNOSIS — I701 Atherosclerosis of renal artery: Secondary | ICD-10-CM | POA: Diagnosis not present

## 2023-02-24 DIAGNOSIS — I773 Arterial fibromuscular dysplasia: Secondary | ICD-10-CM | POA: Diagnosis not present

## 2023-02-24 NOTE — Progress Notes (Signed)
HISTORY AND PHYSICAL     CC:  follow up. Requesting Provider:  Jarrett Soho, PA-C  HPI: This is a 57 y.o. female who is here today for follow up and is pt of Dr. Randie Heinz and has hx of FMD.  She has hx of 2 mini strokes in 2016 where she had lip numbness on 2 separate occasions that did not last very long.  She was placed on asa and she has not had any further issues.  She states she was placed on a statin for about 6 months after her stroke and then it was discontinued.    The pt returns today for follow up.    Pt denies any amaurosis fugax, speech difficulties, weakness, numbness, paralysis or clumsiness or facial droop.    Pt denies any fear of food, or pain after eating or weight loss.    She states that she is now on CPAP for OSA and feels more rested.    She continues to run and just recently ran a 1/2 marathon.    She is compliant with here daily asa.    The pt is not on a statin for cholesterol management.    The pt is on an aspirin.    Other AC:  none The pt is not on medication for hypertension.  The pt is not on medication for diabetes. Tobacco hx:  never  Pt does not have family hx of AAA.    Past Medical History:  Diagnosis Date   Chronic back pain    Fibromuscular dysplasia (HCC)    Sleep apnea    Stroke [facility])     Past Surgical History:  Procedure Laterality Date   NASAL SEPTUM SURGERY      No Known Allergies  Current Outpatient Medications  Medication Sig Dispense Refill   aspirin EC 81 MG tablet Take 1 tablet (81 mg total) by mouth daily.     Cholecalciferol (VITAMIN D3) 1000 units CAPS Take 2,000 Units by mouth.     cyclobenzaprine (FLEXERIL) 5 MG tablet Take 1 tablet (5 mg total) by mouth at bedtime as needed for muscle spasms. 30 tablet 1   fluorouracil (EFUDEX) 5 % cream Apply topically.     Glucosamine Sulfate (SYNOVACIN PO) Glucosamine     meloxicam (MOBIC) 7.5 MG tablet TAKE 1 TABLET BY MOUTH EVERY DAY 90 tablet 0   Probiotic Product  (PROBIOTIC DAILY PO) Take by mouth.     tiZANidine (ZANAFLEX) 2 MG tablet TAKE 1 TABLET BY MOUTH AT BEDTIME. 90 tablet 1   No current facility-administered medications for this visit.    Family History  Problem Relation Age of Onset   Heart murmur Mother        Living, 29   Alzheimer's disease Father        Living, 34   Vascular Disease Brother    Healthy Son    Healthy Daughter     Social History   Socioeconomic History   Marital status: Married    Spouse name: Not on file   Number of children: 2   Years of education: 18   Highest education level: Not on file  Occupational History   Not on file  Tobacco Use   Smoking status: Never   Smokeless tobacco: Never  Vaping Use   Vaping status: Never Used  Substance and Sexual Activity   Alcohol use: Yes    Alcohol/week: 0.0 standard drinks of alcohol    Comment: 3-4 drinks per week, on occasion  Drug use: No   Sexual activity: Not on file  Other Topics Concern   Not on file  Social History Narrative   Lives with husband and 2 children in a 2 story home.     Works as an Airline pilot part time.     Education: BS degree.   Right handed   Drinks caffeine   Social Determinants of Health   Financial Resource Strain: Not on file  Food Insecurity: Not on file  Transportation Needs: Not on file  Physical Activity: Not on file  Stress: Not on file  Social Connections: Not on file  Intimate Partner Violence: Not on file     REVIEW OF SYSTEMS:   [X]  denotes positive finding, [ ]  denotes negative finding Cardiac  Comments:  Chest pain or chest pressure:    Shortness of breath upon exertion:    Short of breath when lying flat:    Irregular heart rhythm:        Vascular    Pain in calf, thigh, or hip brought on by ambulation:    Pain in feet at night that wakes you up from your sleep:     Blood clot in your veins:    Leg swelling:         Pulmonary    Oxygen at home:    Wheezing:         Neurologic    Sudden  weakness in arms or legs:     Sudden numbness in arms or legs:     Sudden onset of difficulty speaking or understanding others    Temporary loss of vision in one eye:     Problems with dizziness:         Gastrointestinal    Blood in stool:     Vomited blood:         Genitourinary    Burning when urinating:     Blood in urine:        Psychiatric    Major depression:         Hematologic    Bleeding problems:    Problems with blood clotting too easily:        Skin    Rashes or ulcers:        Constitutional    Fever or chills:      PHYSICAL EXAMINATION:  Today's Vitals   02/24/23 1455  BP: 123/79  Pulse: (!) 59  Temp: 98.7 F (37.1 C)  TempSrc: Temporal  SpO2: 97%  Weight: 127 lb (57.6 kg)  Height: 5' (1.524 m)   Body mass index is 24.8 kg/m.   General:  WDWN in NAD; vital signs documented above Gait: Not observed HENT: WNL, normocephalic Pulmonary: normal non-labored breathing  Cardiac: regular HR;  without carotid bruits Abdomen: soft, NT, aortic pulse is not palpable Skin: without rashes Vascular Exam/Pulses:  Right Left  Radial 2+ (normal) 2+ (normal)  PT 2+ (normal) 2+ (normal)   Extremities:  moving all extremities equally Musculoskeletal: no muscle wasting or atrophy  Neurologic: A&O X 3;  speech is fluent/normal; moving all extremities equally  Psychiatric:  The pt has Normal affect.   Non-Invasive Vascular Imaging:   Renal Arterial duplex on 02/17/2023: Right: Normal size right kidney. Normal right Resisitive Index. No         evidence of right renal artery stenosis. RRV flow present.  Left:  Normal size of left kidney. Normal left Resistive Index.         1-59% stenosis  of the left renal artery. LRV flow present   Carotid Duplex on 02/17/2023: Right:  40-59% ICA stenosis Left:  40-59% ICA stenosis  CTA head/neck 08/07/2022: IMPRESSION: 1. No acute intracranial abnormality. Old lacunar infarct in the right cerebellar hemisphere. 2. No  emergent large vessel occlusion or hemodynamically significant stenosis. 3. 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 female here for follow up for PAD and carotid stenosis with hx of FMD   FMD: -renal artery duplex today reveals bilateral normal kidneys with 1-59% stenosis of the left renal artery.  She has normal blood pressure today.    -carotid duplex today reveals 40-59% bilateral ICA stenosis.  Pt had CTA head/neck in May 2024 for severe headache and this was reviewed with Dr. Randie Heinz.  There is no evidence of atherosclerosis in the carotid arteries.  There is some tortuosity of the carotids, which most likely is the reason for elevated velocities on duplex today.  Given there is no atherosclerosis on her CT scan and she is not diabetic, Dr. Randie Heinz did not feel she would benefit from statin.  She should continue her asa.   -discussed s/s of stroke with pt and she understands should she develop any of these sx, she will go to the nearest ER or call 911. -pt will f/u in 2 years with carotid duplex and renal artery duplex.  Will not get mesenteric duplex unless she develops mesenteric ischemia symptoms.  Discussed the above with the pt and she expressed understanding.  She knows to call sooner if she develops any issues before her next visit.      Doreatha Massed, [facility] [phone]  Clinic MD:   Randie Heinz

## 2023-03-10 ENCOUNTER — Ambulatory Visit: Payer: Managed Care, Other (non HMO) | Admitting: Family Medicine

## 2023-04-19 ENCOUNTER — Ambulatory Visit: Payer: Managed Care, Other (non HMO) | Admitting: Family Medicine

## 2023-04-23 NOTE — Progress Notes (Deleted)
  [name] [address] [place] [address] Phone: [phone] Subjective:    I'm seeing this patient by the request  of:  Jarrett Soho, PA-C  CC:   [id number]  Genetha Colliver is a 58 y.o. female coming in with complaint of back and neck pain. OMT on 02/15/2023. Patient states   Medications patient has been prescribed:   Taking:         Reviewed prior external information including notes and imaging from previsou exam, outside providers and external EMR if available.   As well as notes that were available from care everywhere and other healthcare systems.  Past medical history, social, surgical and family history all reviewed in electronic medical record.  No pertanent information unless stated regarding to the chief complaint.   Past Medical History:  Diagnosis Date   Carotid artery occlusion    Chronic back pain    Chronic kidney disease    Fibromuscular dysplasia (HCC)    Sleep apnea    Stroke (HCC)     Allergies  Allergen Reactions   Albuterol Hives     Review of Systems:  No headache, visual changes, nausea, vomiting, diarrhea, constipation, dizziness, abdominal pain, skin rash, fevers, chills, night sweats, weight loss, swollen lymph nodes, body aches, joint swelling, chest pain, shortness of breath, mood changes. POSITIVE muscle aches  Objective  Last menstrual period 06/03/2014.   General: No apparent distress alert and oriented x3 mood and affect normal, dressed appropriately.  HEENT: Pupils equal, extraocular movements intact  Respiratory: Patient's speak in full sentences and does not appear short of breath  Cardiovascular: No lower extremity edema, non tender, no erythema  Gait MSK:  Back   Osteopathic findings  C2 flexed rotated and side bent right C6 flexed rotated and side bent left T3 extended rotated and side bent right inhaled rib T9 extended rotated and side bent left L2 flexed rotated and  side bent right Sacrum right on right       Assessment and Plan:  No problem-specific Assessment & Plan notes found for this encounter.    Nonallopathic problems  Decision today to treat with OMT was based on Physical Exam  After verbal consent patient was treated with HVLA, ME, FPR techniques in cervical, rib, thoracic, lumbar, and sacral  areas  Patient tolerated the procedure well with improvement in symptoms  Patient given exercises, stretches and lifestyle modifications  See medications in patient instructions if given  Patient will follow up in 4-8 weeks             Note: This dictation was prepared with Dragon dictation along with smaller phrase technology. Any transcriptional errors that result from this process are unintentional.

## 2023-04-29 ENCOUNTER — Ambulatory Visit: Payer: Managed Care, Other (non HMO) | Admitting: Family Medicine

## 2023-05-27 ENCOUNTER — Ambulatory Visit: Payer: Managed Care, Other (non HMO) | Admitting: Family Medicine

## 2023-05-28 NOTE — Progress Notes (Signed)
 [facility] [address] [place] [address] Phone: [phone] Subjective:   Rachel Golden, am serving as a scribe for Dr. Antoine Primas. I'm seeing this patient by the request  of:  Jarrett Soho, PA-C  CC: Back pain follow-up  [id number]  Rachel Golden is a 58 y.o. female coming in with complaint of back and neck pain. OMT on 02/15/2023. Patient states that she is her for a tune up.   Medications patient has been prescribed:   Taking:         Reviewed prior external information including notes and imaging from previsou exam, outside providers and external EMR if available.   As well as notes that were available from care everywhere and other healthcare systems.  Past medical history, social, surgical and family history all reviewed in electronic medical record.  No pertanent information unless stated regarding to the chief complaint.   Past Medical History:  Diagnosis Date   Carotid artery occlusion    Chronic back pain    Chronic kidney disease    Fibromuscular dysplasia (HCC)    Sleep apnea    Stroke (HCC)     Allergies  Allergen Reactions   Albuterol Hives     Review of Systems:  No headache, visual changes, nausea, vomiting, diarrhea, constipation, dizziness, abdominal pain, skin rash, fevers, chills, night sweats, weight loss, swollen lymph nodes, body aches, joint swelling, chest pain, shortness of breath, mood changes. POSITIVE muscle aches  Objective  Blood pressure 118/84, pulse 67, height 5' (1.524 m), weight 127 lb (57.6 kg), last menstrual period 06/03/2014, SpO2 100%.   General: No apparent distress alert and oriented x3 mood and affect normal, dressed appropriately.  HEENT: Pupils equal, extraocular movements intact  Respiratory: Patient's speak in full sentences and does not appear short of breath  Cardiovascular: No lower extremity edema, non tender, no erythema  Gait MSK:  Back does have some loss  lordosis noted.  Some tenderness to palpation in the paraspinal musculature.  Tightness with [name] right greater than left.  A lot of tightness in the thoracolumbar junction  Osteopathic findings  T11 extended rotated and side bent left L1 flexed rotated and side bent right L2 flexed rotated and side bent right Sacrum right on right       Assessment and Plan:  Lumbar back pain Hip flexor tightness still noted as well as the low back pain.  Seems to be though more higher than usual and in the thoracolumbar junction noted.  Discussed icing regimen and home exercises, which activities to do and which ones to avoid.  Continue working on core strengthening noted.  Increase activity slowly over the course the next 4 to 6 weeks and follow-up with me again at that time.    Nonallopathic problems  Decision today to treat with OMT was based on Physical Exam  After verbal consent patient was treated with HVLA, ME, FPR techniques in  thoracic, lumbar, and sacral  areas  Patient tolerated the procedure well with improvement in symptoms  Patient given exercises, stretches and lifestyle modifications  See medications in patient instructions if given  Patient will follow up in 8 weeks    The above documentation has been reviewed and is accurate and complete [name], DO          Note: This dictation was prepared with Dragon dictation along with smaller phrase technology. Any transcriptional errors that result from this process are unintentional.

## 2023-06-01 ENCOUNTER — Encounter: Payer: Self-pay | Admitting: Family Medicine

## 2023-06-01 ENCOUNTER — Ambulatory Visit: Payer: Managed Care, Other (non HMO) | Admitting: Family Medicine

## 2023-06-01 VITALS — BP 118/84 | HR 67 | Ht 60.0 in | Wt 127.0 lb

## 2023-06-01 DIAGNOSIS — M545 Low back pain, unspecified: Secondary | ICD-10-CM

## 2023-06-01 DIAGNOSIS — M9902 Segmental and somatic dysfunction of thoracic region: Secondary | ICD-10-CM | POA: Diagnosis not present

## 2023-06-01 DIAGNOSIS — M9904 Segmental and somatic dysfunction of sacral region: Secondary | ICD-10-CM | POA: Diagnosis not present

## 2023-06-01 DIAGNOSIS — M9903 Segmental and somatic dysfunction of lumbar region: Secondary | ICD-10-CM | POA: Diagnosis not present

## 2023-06-01 NOTE — Assessment & Plan Note (Signed)
 Hip flexor tightness still noted as well as the low back pain.  Seems to be though more higher than usual and in the thoracolumbar junction noted.  Discussed icing regimen and home exercises, which activities to do and which ones to avoid.  Continue working on core strengthening noted.  Increase activity slowly over the course the next 4 to 6 weeks and follow-up with me again at that time.

## 2023-06-01 NOTE — Patient Instructions (Addendum)
 Good to see you Thanks for letting me wallow in sorrow See me again in 2 months

## 2023-08-03 ENCOUNTER — Ambulatory Visit: Payer: Managed Care, Other (non HMO) | Admitting: Family Medicine

## 2023-08-23 ENCOUNTER — Encounter: Payer: Self-pay | Admitting: Neurology

## 2023-08-23 ENCOUNTER — Ambulatory Visit: Payer: Managed Care, Other (non HMO) | Admitting: Neurology

## 2023-08-23 VITALS — BP 123/82 | HR 63 | Ht 60.0 in | Wt 123.0 lb

## 2023-08-23 DIAGNOSIS — I773 Arterial fibromuscular dysplasia: Secondary | ICD-10-CM | POA: Diagnosis not present

## 2023-08-23 NOTE — Progress Notes (Signed)
 Follow-up Visit   Date: 08/23/23    Rachel Golden MRN: [id number] DOB: 23-Aug-1965   Interim History: Rachel Golden is a 58 y.o. right-handed Caucasian female with chronic back pain, fibromuscular dysplasia and stroke for follow-up of FMD. The patient was accompanied to the clinic by self.  IMPRESSION/PLAN: Cervicalgia, intermittent.  Overall, neck pain has significantly improved. 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  No neurological deficits or residual symptoms. 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  Recheck 2 years, unless new neurological symptoms arise.  She is also followed by Vascular surgery.  Continue aspirin  81mg  daily.  Return to clinic in 1 year  -------------------------------------------------------------- History of present illness: On May 13th 2016, she was picking up a hanger off the floor and developed acute onset of dizziness.  She initially thought it was because she got up too quickly.  She then heard a "swooshing" sound in her head and developed a piercing headache. Swooshing sound resolved within 5 minutes.  Within 30-minutes, she developed right hand tingling and numbness of the lips.  She called 911 who evaluated her and did not feel that her symptoms were consistent with a stroke, so was not taken to the emergency department.  She also recalls having neck pain especially with neck rotation to the right. On May 19th, she was getting out of the car and developed neck stiffness and heard a swooshing sound in her right ear with associated brief but severe headache.  She also has associated vision disturbance described as "kelidoscope" vision, dizzy, and nauseous. She had difficulty walking out of her car to  her home, but was able to make it into her home slowly.  Symptoms lasted about 10-minutes.   She again went to see Dr. Felipe Horton on the same day who ordered MRI/A of her head.  Imaging shows small infarcts involving the right cerebellum and medial right parieto-occipital region. Vessel imaging was shows vascular irregularity involving bilateral vertebral arteries with ?right dissection, concerning for fibromuscular dysplasia but I do not appreciate this well on my review.  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  Her stroke work-up has been returning largely normal including echocardiogram, holter monitor, and vasculitis/hypercoagulable labs. She was very happy with her evaluation with Dr. Nat Badger who suggested that only if symptoms worsen or she develops new headache, would cerebral angiogram be indicated.    She was started on aspirin  325mg  and lipitor 20mg  daily.  No personal or family history of stroke.  No personal history of hypertension.  Of note, she has received cervical manipulation for stiffness this month, which alleviated her neck discomfort.  UPDATE 12/31/2021:  She has been doing great over the past year.  No new neurological complaints.  She saw Dr. Vikki Graves with Vascular Surgery after CTA abodmen showed FMD changes involving the right renal and superior mesenteric arteries.  She was recommended to have repeat imaging in 2024.  She will be doing [place] half[place] Marathon in November and travelling to [place] /[place]  during Christmas.  UPDATE 08/25/2022:  Starting around late April, she began having achy pain at the base of her right neck/ear.  Pain was worse with neck rotation.  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  She went to  the ER where she was given a headache cocktail which resolved her pain.  The next two days, she took tylenol, but has not had recurrence of pain.  She denies photophobia, phonophobia, nausea/vomiting.  CTA neck shows stable  changes with FMD.    UPDATE 08/23/2023:  She is here for follow-up visit.  She has been doing well and had not had any new neurological conditions.  She denies any significant neck pain and rarely uses flexeril .  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   Medications:  Current Outpatient Medications on File Prior to Visit  Medication Sig Dispense Refill   aspirin  EC 81 MG tablet Take 1 tablet (81 mg total) by mouth daily.     Cholecalciferol (VITAMIN D3) 1000 units CAPS Take 2,000 Units by mouth.     Multiple Vitamins-Minerals (MULTIVITAMIN WITH MINERALS) tablet Take 1 tablet by mouth daily.     No current facility-administered medications on file prior to visit.    Allergies:  Allergies  Allergen Reactions   Albuterol Hives     Vital Signs:  BP 123/82   Pulse 63   Ht 5' (1.524 m)   Wt 123 lb (55.8 kg)   LMP 06/03/2014 (Approximate)   SpO2 98%   BMI 24.02 kg/m   Neurological Exam: MENTAL STATUS including orientation to time, place, person, recent and remote memory, attention span and concentration, language, and fund of knowledge is normal.  Speech is not dysarthric.  CRANIAL NERVES: Pupils equal round and reactive to light.   Normal conjugate, extra-ocular eye movements in all directions of gaze.  No ptosis.  Face is symmetric.   MOTOR:  Motor strength is 5/5 in all extremities.  No pronator drift.  MSRs:  Reflexes are brisk and symmetric 2+/4 throughout.  SENSORY:  Intact to vibration.  COORDINATION/GAIT:    Gait narrow based and stable.   Data: Labs 08/23/2014:  CRP <0.1, ESR 12, ANA neg, HbA1c 5.5, LDL 55, HDL 76, C3/C4 normal, ENA neg, ANA neg, RF neg, hypercoagulable panel negative (normal protein C and S activity, no Factor V leiden mutation, normal antithrombin III and anticardiolipin antibody)  Surface echocardiogram 08/31/2014:  normal, EF 60% no thrombus  08/30/2014  48-hr holter:   No significant arrhythmias.  No atrial fib or etiology for  an embolic neuro event.  Rare atrial and ventricular ectopy noted.  Predominant rhythm is sinus.     MRI brain 08/27/2014:  Subacute nonhemorrhagic small infarcts involving mid to inferior right cerebellum and medial aspect of the right parietal -occipital lobe.  No intracranial hemorrhage.  MRA head 08/27/2014:  Ectatic cavernous segment internal carotid artery bilaterally. Mild narrowing petrous segment of the internal carotid artery bilaterally. Middle cerebral mild to moderate branch vessel narrowing and irregularity bilaterally greater on the right.   1.6 x 1.6 mm bulge M1 segment right middle cerebral artery appears to be origin of a vessel rather than saccular aneurysm. Stability can be confirmed on follow-up.   Moderate narrowing at the junction of the A1 segment and A2 segment of the right anterior cerebral artery.  Ectatic vertebral arteries and basilar artery without high-grade stenosis.  Full extent of the left posterior inferior cerebellar artery not imaged on present exam. This is noted on the contrast-enhanced MR angiogram of the neck and appears ectatic were proximal narrowing.  Nonvisualized left anterior inferior cerebellar artery.  Narrowing and irregularity superior cerebellar artery greater on the left.  Posterior cerebral artery branch vessel narrowing and irregularity  greater on the left.   MRA NECK FINDINGS Ectatic pleated dilated appearance of the vertebral arteries bilaterally suggestive of fibromuscular dysplasia. With the patient's subacute right sided infarcts, subtle focal dissection with subsequent embolic thrombus cannot be entirely excluded although not detected on the current exam   No significant stenosis of either carotid bifurcation. Ectatic internal carotid artery cervical segment bilaterally may also reflect changes of fibromuscular dysplasia but without pleated appearance as noted involving the vertebral arteries.   MRA head 05/03/2015: 1. Bilateral vertebral  artery beaded appearance in the neck, involving the left distal V2 and V3 segments an the right V3 segment, most compatible with vertebral artery Fibromuscular Dysplasia. No associated stenosis. The bilateral V4 segments appear spared. 2. Bilateral ICA dolichoectasia, without the cervical ICA beaded appearance typical of carotid FMD. No carotid stenosis. 3.  Negative intracranial MRA aside from ICA siphon tortuosity.  MRA head and neck 06/20/2018 performed at [facility]: 1.  Multifocal irregularity of the V2 and V3 segments of both vertebral arteries, which may relate to fibromuscular dysplasia.  No evidence of significant stenosis or occlusion.  Prior outside imaging 127/2017 is not available for direct comparison to assess for progression of these findings. 2.  Extensive tortuosity of the cervical and cavernous bilateral internal carotid arteries, which is nonspecific but can be seen with longstanding hypertension. 3.  No evidence of significant proximal artery narrowing in the head or neck.  MRA head and neck 12/11/2020: 1. Multifocal beading and irregularity involving the bilateral V1 and V2 segments as well as the distal V3 segments, consistent with vertebral artery fibromuscular dysplasia. Findings have progressed as compared to previous MRA from 2017, with at least moderate associated stenoses involving the proximal V2 segments now seen,right slightly worse than left. 2. 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 No carotid stenosis. 3. Otherwise stable and negative intracranial MRA.  CTA head and neck 08/07/2022: 1. No acute intracranial abnormality. Old lacunar infarct in the right cerebellar hemisphere. 2. No emergent large vessel occlusion or hemodynamically significant stenosis. 3. 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    Thank you for allowing me to participate in patient's care.  If I can answer any additional questions, I would be pleased to do so.    Sincerely,    Gene Colee K. Lydia Sams, DO

## 2023-08-25 ENCOUNTER — Ambulatory Visit: Payer: Managed Care, Other (non HMO) | Admitting: Neurology

## 2023-08-27 NOTE — Progress Notes (Unsigned)
  [name] [address] [place] [address] Phone: [phone] Subjective:   Rachel Golden, am serving as a scribe for Dr. Ronnell Coins.  I'm seeing this patient by the request  of:  [name], PA-C  CC: Back a pain follow-up [id number]  Rachel Golden is a 58 y.o. female coming in with complaint of back  pain. OMT on 06/01/2023. Patient states that she has been doing well. Here for adjustment for slight pain in L scapula.   Medications patient has been prescribed:   Taking:         Reviewed prior external information including notes and imaging from previsou exam, outside providers and external EMR if available.   As well as notes that were available from care everywhere and other healthcare systems.  Past medical history, social, surgical and family history all reviewed in electronic medical record.  No pertanent information unless stated regarding to the chief complaint.   Past Medical History:  Diagnosis Date   Carotid artery occlusion    Chronic back pain    Chronic kidney disease    Fibromuscular dysplasia (HCC)    Sleep apnea    Stroke (HCC)     Allergies  Allergen Reactions   Albuterol Hives     Review of Systems:  No headache, visual changes, nausea, vomiting, diarrhea, constipation, dizziness, abdominal pain, skin rash, fevers, chills, night sweats, weight loss, swollen lymph nodes, body aches, joint swelling, chest pain, shortness of breath, mood changes. POSITIVE muscle aches  Objective  Last menstrual period 06/03/2014.   General: No apparent distress alert and oriented x3 mood and affect normal, dressed appropriately.  HEENT: Pupils equal, extraocular movements intact  Respiratory: Patient's speak in full sentences and does not appear short of breath  Cardiovascular: No lower extremity edema, non tender, no erythema  Gait MSK:  Back   Osteopathic findings  C2 flexed rotated and side bent  right C6 flexed rotated and side bent left T3 extended rotated and side bent right inhaled rib T9 extended rotated and side bent left L2 flexed rotated and side bent right Sacrum right on right       Assessment and Plan:  No problem-specific Assessment & Plan notes found for this encounter.    Nonallopathic problems  Decision today to treat with OMT was based on Physical Exam  After verbal consent patient was treated with HVLA, ME, FPR techniques in  thoracic, lumbar, and sacral  areas  Patient tolerated the procedure well with improvement in symptoms  Patient given exercises, stretches and lifestyle modifications  See medications in patient instructions if given  Patient will follow up in 4-8 weeks     The above documentation has been reviewed and is accurate and complete Frankee Gritz M Heli Dino, DO         Note: This dictation was prepared with Dragon dictation along with smaller phrase technology. Any transcriptional errors that result from this process are unintentional.

## 2023-09-02 ENCOUNTER — Ambulatory Visit: Admitting: Family Medicine

## 2023-09-02 VITALS — BP 110/78 | HR 67 | Ht 60.0 in | Wt 124.0 lb

## 2023-09-02 DIAGNOSIS — M859 Disorder of bone density and structure, unspecified: Secondary | ICD-10-CM | POA: Diagnosis not present

## 2023-09-02 DIAGNOSIS — M9902 Segmental and somatic dysfunction of thoracic region: Secondary | ICD-10-CM

## 2023-09-02 DIAGNOSIS — M9904 Segmental and somatic dysfunction of sacral region: Secondary | ICD-10-CM | POA: Diagnosis not present

## 2023-09-02 DIAGNOSIS — M545 Low back pain, unspecified: Secondary | ICD-10-CM | POA: Diagnosis not present

## 2023-09-02 DIAGNOSIS — M9903 Segmental and somatic dysfunction of lumbar region: Secondary | ICD-10-CM

## 2023-09-02 NOTE — Patient Instructions (Signed)
 Remember the basketball trick Bring vest in next time See me in 2 months

## 2023-09-03 ENCOUNTER — Encounter: Payer: Self-pay | Admitting: Family Medicine

## 2023-09-03 NOTE — Assessment & Plan Note (Signed)
 More discomfort in the left shoulder region.  Has had trigger points previously and may need to consider injections again.  Responded extremely well to osteopathic manipulation.  Discussed ergonomics throughout the day.  Reviewing patient's chart has had some bone demineralization previously on x-rays and do feel that with patient's also vascular pathology that a bone density is necessary.  Will get this ordered in the near future.  Follow-up again 6 to 8 weeks.

## 2023-09-14 ENCOUNTER — Ambulatory Visit (INDEPENDENT_AMBULATORY_CARE_PROVIDER_SITE_OTHER)
Admission: RE | Admit: 2023-09-14 | Discharge: 2023-09-14 | Disposition: A | Discharge status: Home / Self Care | Source: Ambulatory Visit | Attending: Family Medicine | Admitting: Family Medicine

## 2023-09-14 DIAGNOSIS — M859 Disorder of bone density and structure, unspecified: Secondary | ICD-10-CM | POA: Diagnosis not present

## 2023-09-19 ENCOUNTER — Ambulatory Visit: Payer: Self-pay | Admitting: Family Medicine

## 2023-11-04 ENCOUNTER — Ambulatory Visit: Admitting: Family Medicine

## 2023-12-13 NOTE — Progress Notes (Unsigned)
  [facility] [address] [place] [address] Phone: [phone] Subjective:   [name], am serving as a scribe for Dr. Arthea Claudene.  I'm seeing this patient by the request  of:  Katina Pfeiffer, PA-C  CC: Back and neck pain follow-up  YEP:[id number]  Orissa Arreaga is a 58 y.o. female coming in with complaint of back and neck pain. OMT on 09/02/2023. Patient states that she started to do pilates. Feels like it is helping and she going to join [employer] to do pilates classes and start swimming.   Medications patient has been prescribed:   Taking:         Reviewed prior external information including notes and imaging from previsou exam, outside providers and external EMR if available.   As well as notes that were available from care everywhere and other healthcare systems.  Past medical history, social, surgical and family history all reviewed in electronic medical record.  No pertanent information unless stated regarding to the chief complaint.   Past Medical History:  Diagnosis Date   Carotid artery occlusion    Chronic back pain    Chronic kidney disease    Fibromuscular dysplasia (HCC)    Sleep apnea    Stroke (HCC)     Allergies  Allergen Reactions   Albuterol Hives     Review of Systems:  No headache, visual changes, nausea, vomiting, diarrhea, constipation, dizziness, abdominal pain, skin rash, fevers, chills, night sweats, weight loss, swollen lymph nodes, body aches, joint swelling, chest pain, shortness of breath, mood changes. POSITIVE muscle aches  Objective  Blood pressure 112/84, pulse 85, height 5' (1.524 m), weight 129 lb (58.5 kg), last menstrual period 06/03/2014, SpO2 98%.   General: No apparent distress alert and oriented x3 mood and affect normal, dressed appropriately.  HEENT: Pupils equal, extraocular movements intact  Respiratory: Patient's speak in full sentences and does not appear short of breath   Cardiovascular: No lower extremity edema, non tender, no erythema  Gait MSK:  Back does have tightness noted in the paraspinal musculature lumbar spine right greater than left.  Tenderness to palpation of the paraspinal musculature.  Osteopathic findings  T5 extended rotated and side bent right inhaled rib T9 extended rotated and side bent left L2 flexed rotated and side bent right Sacrum right on right     Assessment and Plan:  Lumbar back pain Responded extremely well to osteopathic manipulation after further evaluation.  Staying very active.  Discussed icing regimen and home exercises.  Patient has been wearing a weighted vest and is improving her core strength.  Discussed icing regimen and home exercises.  Follow-up again in 6 to 8 weeks otherwise.    Nonallopathic problems  Decision today to treat with OMT was based on Physical Exam  After verbal consent patient was treated with HVLA, ME, FPR techniques in  rib, thoracic, lumbar, and sacral  areas  Patient tolerated the procedure well with improvement in symptoms  Patient given exercises, stretches and lifestyle modifications  See medications in patient instructions if given  Patient will follow up in 4-8 weeks    The above documentation has been reviewed and is accurate and complete Tametha Banning M Rhena Glace, DO          Note: This dictation was prepared with Dragon dictation along with smaller phrase technology. Any transcriptional errors that result from this process are unintentional.

## 2023-12-14 ENCOUNTER — Ambulatory Visit (INDEPENDENT_AMBULATORY_CARE_PROVIDER_SITE_OTHER): Admitting: Family Medicine

## 2023-12-14 ENCOUNTER — Encounter: Payer: Self-pay | Admitting: Family Medicine

## 2023-12-14 VITALS — BP 112/84 | HR 85 | Ht 60.0 in | Wt 129.0 lb

## 2023-12-14 DIAGNOSIS — M9903 Segmental and somatic dysfunction of lumbar region: Secondary | ICD-10-CM

## 2023-12-14 DIAGNOSIS — M545 Low back pain, unspecified: Secondary | ICD-10-CM

## 2023-12-14 DIAGNOSIS — M9902 Segmental and somatic dysfunction of thoracic region: Secondary | ICD-10-CM

## 2023-12-14 DIAGNOSIS — M9904 Segmental and somatic dysfunction of sacral region: Secondary | ICD-10-CM

## 2023-12-14 NOTE — Patient Instructions (Signed)
 Good to see you! Rachel Golden See you again in 2 months

## 2023-12-14 NOTE — Assessment & Plan Note (Signed)
 Responded extremely well to osteopathic manipulation after further evaluation.  Staying very active.  Discussed icing regimen and home exercises.  Patient has been wearing a weighted vest and is improving her core strength.  Discussed icing regimen and home exercises.  Follow-up again in 6 to 8 weeks otherwise.

## 2024-02-08 NOTE — Progress Notes (Signed)
 Subjective Zariyah Stephens is a 58 y.o. female who presents for Chills (Fever 100.1, sweats, cough, thirsty x3 days  ) History of Present Illness This is a 58 year old female with a history of CPAP use presenting with fever, cough, and chills.  The patient reports that her symptoms began on Sunday night (02/06/2024) with body aches, which have since resolved. She has been experiencing intermittent fevers, chills, and sweats. She describes her cough as deep but non-productive and states that she does not have nasal congestion or rhinorrhea. She has not taken any medication specifically for her cough. She reports an episode of disorientation yesterday (02/07/2024), during which she was unable to perform simple tasks such as dialing her husband's phone number, denies any recent episode of disorientation today. She has been managing her symptoms with alternating doses of ibuprofen  and Tylenol every 4 hours. She took two ibuprofens at 5:30 this morning when her temperature was recorded as 103F. Her temperature was normal at 97.26F during the visit. She reports no ear pain, chest pain, sore throat, wheezing, abdominal pain, headaches, or back pain. Her urinary function is normal. She does not have any known kidney issues. She does not smoke cigarettes and has no history of asthma or COPD. Her husband had COVID two weeks ago.   SOCIAL HISTORY She does not smoke.  History reviewed. No pertinent past medical history.  Allergies[1]   History reviewed. No pertinent surgical history.  History reviewed. No pertinent family history.  Social History[2]   Review of Systems Review of Systems - All systems reviewed and are negative except what is noted in the HPI.    Objective Blood pressure 118/81, pulse 78, temperature 97.8 F (36.6 C), temperature source Oral, resp. rate 14, weight 123 lb 12.8 oz (56.2 kg), SpO2 98%. Physical Exam  General appearance: Alert, oriented, appears stated age, cooperative and  no distress. Head: Normocephalic, without obvious abnormality, atraumatic. Eyes: Conjunctivae/corneas clear. PERRL, EOM's intact. No drainage.  Ears: Bilateral TM intact, nonbulging, nonerythematous.  Bilateral EAC has some pieces of dry wax not completely impacted.   Nose: Nares patent, septum midline, turbinates normal.  No sinus tenderness to palpation. Throat: Lips, mucosa, and tongue normal; teeth and gums normal.  Uvula midline, Posterior oropharynx nonerythematous. Neck: No adenopathy, supple, symmetrical, non-tender to palpation.  Lungs: Patient coughing during the examination.  Clear to auscultation bilaterally with equal, non-labored breathing.  No audible wheezing, rales, rhonchi.  Patient speaks in full sentences without increased work of breathing.  No retractions. Heart: Regular rate and rhythm, S1, S2 normal, no murmur, click, rub or gallop. Skin: Skin color, texture, turgor normal. No rashes or lesions.   Results Laboratory Studies COVID-19 test is negative. Influenza test is negative. Laboratory Testing Recent Results (from the past 24 hours)  POCT Flu Nucleic Acid   Collection Time: 02/08/24  8:34 AM  Result Value Ref Range   Influenza A/B Negative Negative  POCT CoVid-19 nucleic acid   Collection Time: 02/08/24  8:35 AM  Result Value Ref Range   SARS-COV-2, NAA Negative Negative     Assessment & Plan Viral upper respiratory infection 58 year old female with fever, chills, sweating, and mild dry cough. Symptoms started Sunday night. COVID-19 and influenza tests negative. Treatment: Benzonatate 200 mg 3 times a day as needed for cough prescription sent.  Discussed about appropriate use and side effect of the medication. Encourage rest and increase oral fluid intake. Saline nasal spray or and humidifier as needed for nasal congestion. Tylenol and ibuprofen  for  fever and pain management. Warm salt water gargle for sore throat. Avoid irritants such as smoke. If  congestion is significant, may use over-the-counter decongestant for short-term relief unless contraindicated. Educated patient that antibiotics are not indicated for viral URI.  Follow-up with PCP in 3 days if fever continue to persist despite of taking Tylenol ibuprofen . Any symptoms of respiratory distress, high fever, disoriented go to ER.     Follow-up: Primary care physician if fever persists. ER if high fever with disorientation.     Patient's Medications      * Accurate as of February 08, 2024 10:20 AM. Reflects encounter med changes as of last refresh        New Prescriptions     Instructions  benzonatate 200 MG capsule Commonly known as: TESSALON Started by: Binita Baniya, NP  200 mg, Oral, 3 times a day as needed         Advised patient to follow up with their PCP or RTC this week if condition persists or if symptoms worsen go to the emergency department for evaluation.  Previsit planning was completed via snapshot and review of chart.  Patient/family verbalized to me that they understood what their problem is, what they need to do about it, and why it is important that they do it.  The patient/family voices understanding of all medications. No barriers to adherence were noted. Patient is taking all medications as prescribed and is tolerating well.  Plan for follow-up as discussed or as needed if any worsening symptoms or change in condition.  After Visit Summary was offered and/or provided via MyChart.  Attestation  Designer, fashion/clothing was used to create visit note. Consent from the patient/caregiver was obtained prior to its use.       [1] Allergies Allergen Reactions  . Albuterol Hives  [2] Social History Socioeconomic History  . Marital status: Married  Tobacco Use  . Smoking status: Never  . Smokeless tobacco: Never  *Some images could not be shown.

## 2024-02-09 ENCOUNTER — Other Ambulatory Visit: Payer: Self-pay

## 2024-02-09 ENCOUNTER — Emergency Department (HOSPITAL_BASED_OUTPATIENT_CLINIC_OR_DEPARTMENT_OTHER): Admitting: Radiology

## 2024-02-09 ENCOUNTER — Emergency Department (HOSPITAL_BASED_OUTPATIENT_CLINIC_OR_DEPARTMENT_OTHER)

## 2024-02-09 ENCOUNTER — Emergency Department (HOSPITAL_BASED_OUTPATIENT_CLINIC_OR_DEPARTMENT_OTHER): Admission: EM | Admit: 2024-02-09 | Discharge: 2024-02-09 | Disposition: A | Discharge status: Home / Self Care

## 2024-02-09 DIAGNOSIS — Z7982 Long term (current) use of aspirin: Secondary | ICD-10-CM | POA: Insufficient documentation

## 2024-02-09 DIAGNOSIS — J181 Lobar pneumonia, unspecified organism: Secondary | ICD-10-CM | POA: Insufficient documentation

## 2024-02-09 DIAGNOSIS — J189 Pneumonia, unspecified organism: Secondary | ICD-10-CM

## 2024-02-09 DIAGNOSIS — I2510 Atherosclerotic heart disease of native coronary artery without angina pectoris: Secondary | ICD-10-CM | POA: Insufficient documentation

## 2024-02-09 DIAGNOSIS — R519 Headache, unspecified: Secondary | ICD-10-CM

## 2024-02-09 DIAGNOSIS — R509 Fever, unspecified: Secondary | ICD-10-CM | POA: Diagnosis present

## 2024-02-09 LAB — CBC WITH DIFFERENTIAL/PLATELET
Abs Immature Granulocytes: 0.01 K/uL (ref 0.00–0.07)
Basophils Absolute: 0 K/uL (ref 0.0–0.1)
Basophils Relative: 0 %
Eosinophils Absolute: 0 K/uL (ref 0.0–0.5)
Eosinophils Relative: 1 %
HCT: 37.4 % (ref 36.0–46.0)
Hemoglobin: 12.8 g/dL (ref 12.0–15.0)
Immature Granulocytes: 0 %
Lymphocytes Relative: 10 %
Lymphs Abs: 0.5 K/uL — ABNORMAL LOW (ref 0.7–4.0)
MCH: 30.4 pg (ref 26.0–34.0)
MCHC: 34.2 g/dL (ref 30.0–36.0)
MCV: 88.8 fL (ref 80.0–100.0)
Monocytes Absolute: 0.5 K/uL (ref 0.1–1.0)
Monocytes Relative: 9 %
Neutro Abs: 4.2 K/uL (ref 1.7–7.7)
Neutrophils Relative %: 80 %
Platelets: 175 K/uL (ref 150–400)
RBC: 4.21 MIL/uL (ref 3.87–5.11)
RDW: 13.1 % (ref 11.5–15.5)
WBC: 5.3 K/uL (ref 4.0–10.5)
nRBC: 0 % (ref 0.0–0.2)

## 2024-02-09 LAB — COMPREHENSIVE METABOLIC PANEL WITH GFR
ALT: 33 U/L (ref 0–44)
AST: 41 U/L (ref 15–41)
Albumin: 4.2 g/dL (ref 3.5–5.0)
Alkaline Phosphatase: 75 U/L (ref 38–126)
Anion gap: 10 (ref 5–15)
BUN: 6 mg/dL (ref 6–20)
CO2: 27 mmol/L (ref 22–32)
Calcium: 9.4 mg/dL (ref 8.9–10.3)
Chloride: 97 mmol/L — ABNORMAL LOW (ref 98–111)
Creatinine, Ser: 0.6 mg/dL (ref 0.44–1.00)
GFR, Estimated: >60 mL/min (ref >60)
Glucose, Bld: 135 mg/dL — ABNORMAL HIGH (ref 70–99)
Potassium: 3.8 mmol/L (ref 3.5–5.1)
Sodium: 134 mmol/L — ABNORMAL LOW (ref 135–145)
Total Bilirubin: 0.5 mg/dL (ref 0.0–1.2)
Total Protein: 7.3 g/dL (ref 6.5–8.1)

## 2024-02-09 LAB — TROPONIN T, HIGH SENSITIVITY: Troponin T High Sensitivity: <15 ng/L (ref 0–19)

## 2024-02-09 MED ORDER — ONDANSETRON 4 MG PO TBDP
4.0000 mg | ORAL_TABLET | Freq: Once | ORAL | Status: AC
  Administered 2024-02-09: 4 mg via ORAL
  Filled 2024-02-09: qty 1

## 2024-02-09 MED ORDER — AZITHROMYCIN 250 MG PO TABS
500.0000 mg | ORAL_TABLET | Freq: Once | ORAL | Status: AC
  Administered 2024-02-09: 500 mg via ORAL
  Filled 2024-02-09: qty 2

## 2024-02-09 MED ORDER — DIPHENHYDRAMINE HCL 50 MG/ML IJ SOLN
25.0000 mg | Freq: Once | INTRAMUSCULAR | Status: AC
  Administered 2024-02-09: 25 mg via INTRAVENOUS
  Filled 2024-02-09: qty 1

## 2024-02-09 MED ORDER — PROCHLORPERAZINE EDISYLATE 10 MG/2ML IJ SOLN
5.0000 mg | Freq: Once | INTRAMUSCULAR | Status: AC
  Administered 2024-02-09: 5 mg via INTRAVENOUS
  Filled 2024-02-09: qty 2

## 2024-02-09 MED ORDER — DEXAMETHASONE SOD PHOSPHATE PF 10 MG/ML IJ SOLN
10.0000 mg | Freq: Once | INTRAMUSCULAR | Status: AC
  Administered 2024-02-09: 10 mg via INTRAVENOUS

## 2024-02-09 MED ORDER — AMOXICILLIN-POT CLAVULANATE 875-125 MG PO TABS
1.0000 | ORAL_TABLET | Freq: Once | ORAL | Status: AC
  Administered 2024-02-09: 1 via ORAL
  Filled 2024-02-09: qty 1

## 2024-02-09 MED ORDER — AMOXICILLIN-POT CLAVULANATE 875-125 MG PO TABS: 1.0000 | ORAL_TABLET | Freq: Two times a day (BID) | ORAL | 0 refills | Status: AC

## 2024-02-09 MED ORDER — SODIUM CHLORIDE 0.9 % IV BOLUS
1000.0000 mL | Freq: Once | INTRAVENOUS | Status: AC
  Administered 2024-02-09: 1000 mL via INTRAVENOUS

## 2024-02-09 MED ORDER — AZITHROMYCIN 250 MG PO TABS: 250.0000 mg | ORAL_TABLET | Freq: Every day | ORAL | 0 refills | Status: AC

## 2024-02-09 MED ORDER — ONDANSETRON 4 MG PO TBDP: 4.0000 mg | ORAL_TABLET | Freq: Three times a day (TID) | ORAL | 0 refills | Status: AC | PRN

## 2024-02-09 NOTE — ED Notes (Signed)
 Pt d/c instructions, medications, and follow-up care reviewed with pt. Pt verbalized understanding and had no further questions at time of d/c. Pt CA&Ox4, ambulatory, and in NAD at time of d/c

## 2024-02-09 NOTE — ED Notes (Signed)
 Pt ambulated - Pt denied having SOB/CP, dizziness/lightheaded during ambulating....  HR - Started 86 and upon arrival back to the room 110 SpO2 - Started 96% and upon arrival back to the room 92%   Provider informed.[email or web address]

## 2024-02-09 NOTE — Discharge Instructions (Addendum)
 Please take antibiotics as prescribed.  Alternate Tylenol and ibuprofen  for fever control.  Take Zofran as needed for nausea vomiting.  Make sure staying well-hydrated primarily water but alternate Pedialyte Gatorade.  Return to emergency room with new or worsening symptoms.  Follow-up with primary care in 4 to 6 weeks for repeat chest x-ray to make sure this area is cleared after abx.

## 2024-02-09 NOTE — ED Triage Notes (Addendum)
 Fever, cough, body aches. Tested negative for COVID/flu at [facility]-sent home with benzonatate. Comes in to the ER for headache radiating into neck since 1pm. Hx migraines and CVA 2016. ASA daily.  NIH 0 in triage

## 2024-02-09 NOTE — ED Provider Notes (Signed)
 [place] EMERGENCY DEPARTMENT AT [facility] Provider Note   CSN: [id number] Arrival date & time: 02/09/24  1647     Patient presents with: Headache   Ailani Golden is a 57 y.o. female with past medical history of fibromuscular dysplasia, history of CVA, history of coronary artery disease presents to emergency room with complaint of headache, fevers, muscle aches, cough, nausea.  Patient reports that she was seen at urgent care 3 days ago and diagnosed with viral URI.  Reports her cough seems to getting worse and she starting to develop some chest pain and shortness of breath when she is walking. Reports last taking Tylenol 6 hours ago, fever free right now.     Headache      Prior to Admission medications   Medication Sig Start Date End Date Taking? Authorizing Provider  amoxicillin-clavulanate (AUGMENTIN) 875-125 MG tablet Take 1 tablet by mouth every 12 (twelve) hours. 02/09/24  Yes Wynell Halberg N, PA-C  azithromycin (ZITHROMAX) 250 MG tablet Take 1 tablet (250 mg total) by mouth daily. Take first 2 tablets together, then 1 every day until finished. 02/09/24  Yes Cyrah Mclamb N, PA-C  ondansetron (ZOFRAN-ODT) 4 MG disintegrating tablet Take 1 tablet (4 mg total) by mouth every 8 (eight) hours as needed for nausea or vomiting. 02/09/24  Yes Corrigan Kretschmer, Warren SAILOR, PA-C  aspirin  EC 81 MG tablet Take 1 tablet (81 mg total) by mouth daily. 10/04/14   Patel, Donika K, DO  Cholecalciferol (VITAMIN D3) 1000 units CAPS Take 2,000 Units by mouth.    [provider]  Multiple Vitamins-Minerals (MULTIVITAMIN WITH MINERALS) tablet Take 1 tablet by mouth daily.    [provider]    Allergies: Albuterol    Review of Systems  Neurological:  Positive for headaches.    Updated Vital Signs BP 116/67   Pulse 72   Temp 98.2 F (36.8 C) (Oral)   Resp 16   LMP 06/03/2014 (Approximate)   SpO2 100%   Physical Exam Vitals and nursing note reviewed.  Constitutional:       General: She is not in acute distress.    Appearance: She is not toxic-appearing.  HENT:     Head: Normocephalic and atraumatic.  Eyes:     General: No scleral icterus.    Conjunctiva/sclera: Conjunctivae normal.  Neck:     Comments: No nuchal rigidity or meningeal sign. Cardiovascular:     Rate and Rhythm: Normal rate and regular rhythm.     Pulses: Normal pulses.     Heart sounds: Normal heart sounds.  Pulmonary:     Effort: Pulmonary effort is normal. No respiratory distress.     Breath sounds: Normal breath sounds.     Comments: Rhonchi to left lower lung, no wheezing, no hypoxia, no acute distress.  Abdominal:     General: Abdomen is flat. Bowel sounds are normal.     Palpations: Abdomen is soft.     Tenderness: There is no abdominal tenderness.  Skin:    General: Skin is warm and dry.     Findings: No lesion.     Comments: No rash.  Neurological:     General: No focal deficit present.     Mental Status: She is alert and oriented to person, place, and time. Mental status is at baseline.     GCS: GCS eye subscore is 4. GCS verbal subscore is 5. GCS motor subscore is 6.     Comments: No focal neurological deficits.     (  all labs ordered are listed, but only abnormal results are displayed) Labs Reviewed  CBC WITH DIFFERENTIAL/PLATELET  COMPREHENSIVE METABOLIC PANEL WITH GFR  TROPONIN T, HIGH SENSITIVITY    EKG: None  Radiology: CT HEAD WO CONTRAST Result Date: 02/09/2024 EXAM: CT HEAD WITHOUT CONTRAST 02/09/2024 07:10:58 PM TECHNIQUE: CT of the head was performed without the administration of intravenous contrast. Automated exposure control, iterative reconstruction, and/or weight based adjustment of the mA/kV was utilized to reduce the radiation dose to as low as reasonably achievable. COMPARISON: CT head 08/07/2022 CLINICAL HISTORY: Headache, increasing frequency or severity. FINDINGS: BRAIN AND VENTRICLES: No acute hemorrhage. No evidence of acute infarct. No  hydrocephalus. No extra-axial collection. No mass effect or midline shift. Small remote right cerebellar lacunar infarct. ORBITS: No acute abnormality. SINUSES: No acute abnormality. SOFT TISSUES AND SKULL: No acute soft tissue abnormality. No skull fracture. IMPRESSION: 1. No acute intracranial abnormality. Electronically signed by: Gilmore Molt MD 02/09/2024 07:14 PM EST RP Workstation: [id number]   DG Chest 2 View Result Date: 02/09/2024 EXAM: 2 VIEW(S) XRAY OF THE CHEST 02/09/2024 06:17:00 PM COMPARISON: 03/06/2022 CLINICAL HISTORY: cough FINDINGS: LUNGS AND PLEURA: Left lower lobe airspace disease. No pulmonary edema. No pleural effusion. No pneumothorax. HEART AND MEDIASTINUM: No acute abnormality of the cardiac and mediastinal silhouettes. BONES AND SOFT TISSUES: No acute osseous abnormality. IMPRESSION: 1. Left lower lobe pneumonia. Imaging follow-up to resolution is recommended Electronically signed by: Luke Bun MD 02/09/2024 06:33 PM EST RP Workstation: [id number]     Procedures   Medications Ordered in the ED  sodium chloride  0.9 % bolus 1,000 mL (has no administration in time range)  dexamethasone (DECADRON) injection 10 mg (has no administration in time range)  prochlorperazine (COMPAZINE) injection 5 mg (has no administration in time range)  diphenhydrAMINE  (BENADRYL ) injection 25 mg (has no administration in time range)  ondansetron (ZOFRAN-ODT) disintegrating tablet 4 mg (4 mg Oral Given 02/09/24 1659)    Clinical Course as of 02/09/24 2057  Wed Feb 09, 2024  2021 WBC: 5.3 [JB]  2056 Ambulated well and felt well.  Vital signs have been stable here. Afebrile. Feeling much better after migraine treatment feel appropriate for discharge with outpatient follow-up. [JB]    Clinical Course User Index [JB] Antoine Vandermeulen, Warren SAILOR, PA-C                                 Medical Decision Making Amount and/or Complexity of Data Reviewed Labs: ordered. Decision-making details documented  in ED Course. Radiology: ordered.  Risk Prescription drug management.   This patient presents to the ED for concern of chest pain, this involves an extensive number of treatment options, and is a complaint that carries with it a high risk of complications and morbidity.  The differential diagnosis includes ACS, stable angina, CHF, pneumonia, pneumothorax, aortic dissection, pulmonary embolus    Co morbidities that complicate the patient evaluation  History of CVA   Additional history obtained:  Additional history obtained from the administration urgent care note from 02/08/2024   Lab Tests:  I personally interpreted labs.  The pertinent results include:   No leukocytosis Troponin troponin under 15   Imaging Studies ordered:  I ordered imaging studies including chest x-ray, head CT  I independently visualized and interpreted imaging which showed left lower lobe pneumonia on chest x-ray, no acute findings on head CT I agree with the radiologist interpretation   Cardiac Monitoring: / EKG:  The patient was maintained on a cardiac monitor.     Problem List / ED Course / Critical interventions / Medication management  Patient reports to emergency room with complaint of chest congestion, cough, body aches and headache.  She reports she had respiratory panel done at urgent care which was normal.  She reports she has been nauseous.  She reports she has had fever at home.  On arrival to emergency room she is hemodynamically stable and well-appearing.  She is afebrile.  CBC shows no leukocytosis.  CMP and troponin within normal limits.  I did obtain a head CT due to her reported headache which shows no acute findings.  She does have no meningeal signs and I have low suspicion for meningitis or encephalitis.  Her chest x-ray does show a left lower lobe pneumonia which does seem consistent with symptoms. I ordered medication including migraine cocktail Reevaluation of the patient after  these medicines showed that the patient improved I have reviewed the patients home medicines and have made adjustments as needed. Patient is tolerating oral intake.  Ambulated with steady gait and did not desaturate.  Vital signs stable throughout stay.  Given first dose of antibiotic here.  Will follow-up with primary care for recheck of pneumonia symptoms.      Final diagnoses:  Pneumonia of left lower lobe due to infectious organism  Bad headache    ED Discharge Orders          Ordered    amoxicillin-clavulanate (AUGMENTIN) 875-125 MG tablet  Every 12 hours        02/09/24 1909    azithromycin (ZITHROMAX) 250 MG tablet  Daily        02/09/24 1909    ondansetron (ZOFRAN-ODT) 4 MG disintegrating tablet  Every 8 hours PRN        02/09/24 1909               Shermon Warren SAILOR, PA-C 02/09/24 2059    Ula Prentice SAUNDERS, MD 02/09/24 2329

## 2024-02-17 NOTE — Progress Notes (Signed)
  [facility] [address] [place] [address] Phone: [phone] Subjective:   [name], am serving as a scribe for Dr. Arthea Claudene.  I'm seeing this patient by the request  of:  Katina Pfeiffer, PA-C  CC: Back pain follow-up  YEP:Dlagzrupcz  Aamari West is a 58 y.o. female coming in with complaint of back and neck pain. OMT on 12/14/2023. Patient states that she needs a slight adjustment. Had pneumonia 2 weeks ago. Has not been as active. Has been back to walking but not running.   Medications patient has been prescribed:   Taking:         Reviewed prior external information including notes and imaging from previsou exam, outside providers and external EMR if available.   As well as notes that were available from care everywhere and other healthcare systems.  Past medical history, social, surgical and family history all reviewed in electronic medical record.  No pertanent information unless stated regarding to the chief complaint.   Past Medical History:  Diagnosis Date   Carotid artery occlusion    Chronic back pain    Chronic kidney disease    Fibromuscular dysplasia    Sleep apnea    Stroke (HCC)     Allergies  Allergen Reactions   Albuterol Hives     Review of Systems:  No headache, visual changes, nausea, vomiting, diarrhea, constipation, dizziness, abdominal pain, skin rash, fevers, chills, night sweats, weight loss, swollen lymph nodes, body aches, joint swelling, chest pain, shortness of breath, mood changes. POSITIVE muscle aches  Objective  Blood pressure 102/72, pulse 78, height 5' (1.524 m), weight 129 lb (58.5 kg), last menstrual period 06/03/2014, SpO2 98%.   General: No apparent distress alert and oriented x3 mood and affect normal, dressed appropriately.  HEENT: Pupils equal, extraocular movements intact  Respiratory: Patient's speak in full sentences and does not appear short of breath   Cardiovascular: No lower extremity edema, non tender, no erythema  Low backDoes have some loss of lordosis.  Some tenderness to palpation in the paraspinal musculature.  Tightness with FABER right greater than left.  Osteopathic findings  T3 extended rotated and side bent right inhaled rib T9 extended rotated and side bent left L2 flexed rotated and side bent right L5 flexed rotated sidebent left Sacrum right on right    Assessment and Plan:  Lumbar back pain Avoided any manipulation of the neck.  Patient had more of a right periscapular area tenderness.  Discussed icing regimen and home exercises, which activities to do and which ones to avoid.  Increase activity slowly.  Follow-up again in 6 to 8 weeks    Nonallopathic problems  Decision today to treat with OMT was based on Physical Exam  After verbal consent patient was treated with HVLA, ME, FPR techniques in  rib, thoracic, lumbar, and sacral  areas  Patient tolerated the procedure well with improvement in symptoms  Patient given exercises, stretches and lifestyle modifications  See medications in patient instructions if given  Patient will follow up in 4-8 weeks     The above documentation has been reviewed and is accurate and complete Addysyn Fern M Samari Bittinger, DO         Note: This dictation was prepared with Dragon dictation along with smaller phrase technology. Any transcriptional errors that result from this process are unintentional.

## 2024-02-21 ENCOUNTER — Ambulatory Visit: Admitting: Family Medicine

## 2024-02-21 ENCOUNTER — Encounter: Payer: Self-pay | Admitting: Family Medicine

## 2024-02-21 VITALS — BP 102/72 | HR 78 | Ht 60.0 in | Wt 129.0 lb

## 2024-02-21 DIAGNOSIS — M9904 Segmental and somatic dysfunction of sacral region: Secondary | ICD-10-CM | POA: Diagnosis not present

## 2024-02-21 DIAGNOSIS — M545 Low back pain, unspecified: Secondary | ICD-10-CM

## 2024-02-21 DIAGNOSIS — M9902 Segmental and somatic dysfunction of thoracic region: Secondary | ICD-10-CM

## 2024-02-21 DIAGNOSIS — M9903 Segmental and somatic dysfunction of lumbar region: Secondary | ICD-10-CM

## 2024-02-21 NOTE — Assessment & Plan Note (Signed)
 Avoided any manipulation of the neck.  Patient had more of a right periscapular area tenderness.  Discussed icing regimen and home exercises, which activities to do and which ones to avoid.  Increase activity slowly.  Follow-up again in 6 to 8 weeks

## 2024-02-21 NOTE — Patient Instructions (Signed)
 Great to see you Ok to increase activity See me in 2 months

## 2024-03-17 ENCOUNTER — Ambulatory Visit: Admitting: Family Medicine

## 2024-04-14 ENCOUNTER — Ambulatory Visit: Admitting: Family Medicine

## 2024-04-28 NOTE — Progress Notes (Unsigned)
" °  [facility] [address] [place] [address] Phone: [phone] Subjective:    I'm seeing this patient by the request  of:  Katina Pfeiffer, PA-C  CC:   YEP:Dlagzrupcz  Rachel Golden is a 59 y.o. female coming in with complaint of back and neck pain. OMT on 02/21/2024. Patient states   Medications patient has been prescribed:   Taking:         Reviewed prior external information including notes and imaging from previsou exam, outside providers and external EMR if available.   As well as notes that were available from care everywhere and other healthcare systems.  Past medical history, social, surgical and family history all reviewed in electronic medical record.  No pertanent information unless stated regarding to the chief complaint.   Past Medical History:  Diagnosis Date   Carotid artery occlusion    Chronic back pain    Chronic kidney disease    Fibromuscular dysplasia    Sleep apnea    Stroke (HCC)     Allergies[1]   Review of Systems:  No headache, visual changes, nausea, vomiting, diarrhea, constipation, dizziness, abdominal pain, skin rash, fevers, chills, night sweats, weight loss, swollen lymph nodes, body aches, joint swelling, chest pain, shortness of breath, mood changes. POSITIVE muscle aches  Objective  Last menstrual period 06/03/2014.   General: No apparent distress alert and oriented x3 mood and affect normal, dressed appropriately.  HEENT: Pupils equal, extraocular movements intact  Respiratory: Patient's speak in full sentences and does not appear short of breath  Cardiovascular: No lower extremity edema, non tender, no erythema  Gait MSK:  Back   Osteopathic findings  C2 flexed rotated and side bent right C6 flexed rotated and side bent left T3 extended rotated and side bent right inhaled rib T9 extended rotated and side bent left L2 flexed rotated and side bent right Sacrum right on  right       Assessment and Plan:  No problem-specific Assessment & Plan notes found for this encounter.    Nonallopathic problems  Decision today to treat with OMT was based on Physical Exam  After verbal consent patient was treated with HVLA, ME, FPR techniques in cervical, rib, thoracic, lumbar, and sacral  areas  Patient tolerated the procedure well with improvement in symptoms  Patient given exercises, stretches and lifestyle modifications  See medications in patient instructions if given  Patient will follow up in 4-8 weeks             Note: This dictation was prepared with Dragon dictation along with smaller phrase technology. Any transcriptional errors that result from this process are unintentional.            [1]  Allergies Allergen Reactions   Albuterol Hives   "

## 2024-05-01 ENCOUNTER — Ambulatory Visit: Admitting: Family Medicine

## 2024-05-03 ENCOUNTER — Encounter: Payer: Self-pay | Admitting: Family Medicine

## 2024-05-03 ENCOUNTER — Ambulatory Visit: Admitting: Family Medicine

## 2024-05-03 VITALS — BP 104/78 | HR 83 | Ht 60.0 in | Wt 126.0 lb

## 2024-05-03 DIAGNOSIS — M9908 Segmental and somatic dysfunction of rib cage: Secondary | ICD-10-CM | POA: Diagnosis not present

## 2024-05-03 DIAGNOSIS — M545 Low back pain, unspecified: Secondary | ICD-10-CM | POA: Diagnosis not present

## 2024-05-03 DIAGNOSIS — M9903 Segmental and somatic dysfunction of lumbar region: Secondary | ICD-10-CM

## 2024-05-03 DIAGNOSIS — M9902 Segmental and somatic dysfunction of thoracic region: Secondary | ICD-10-CM

## 2024-05-03 DIAGNOSIS — M9904 Segmental and somatic dysfunction of sacral region: Secondary | ICD-10-CM

## 2024-05-03 MED ORDER — METHYLPREDNISOLONE ACETATE 40 MG/ML IJ SUSP
40.0000 mg | Freq: Once | INTRAMUSCULAR | Status: AC
  Administered 2024-05-03: 40 mg via INTRAMUSCULAR

## 2024-05-03 MED ORDER — PREDNISONE 20 MG PO TABS: 40.0000 mg | ORAL_TABLET | Freq: Every day | ORAL | 0 refills | Status: AC

## 2024-05-03 MED ORDER — KETOROLAC TROMETHAMINE 30 MG/ML IJ SOLN
30.0000 mg | Freq: Once | INTRAMUSCULAR | Status: AC
  Administered 2024-05-03: 30 mg via INTRAMUSCULAR

## 2024-05-03 NOTE — Patient Instructions (Addendum)
 Enjoy the sun Injections in the backside. No NSAIDs for 24 hours.  Prednisone  40mg  for 5 days if you need it. Start tomorrow if needed.  See me in 7-8 weeks.

## 2024-05-03 NOTE — Progress Notes (Signed)
" °  [facility] [address] [place] [address] Phone: [phone] Subjective:   Rachel Golden, am serving as a scribe for Dr. Arthea Golden.  I'm seeing this patient by the request  of:  Rachel Pfeiffer, PA-C  CC: Low back pain  YEP:Dlagzrupcz  Rachel Golden is a 59 y.o. female coming in with complaint of back and neck pain Patient states that she is having some L SI Joint pain on Monday the 19th after sitting and doing paperwork. Able to walk for exercise but sitting increases her pain. Denies any radiating symptoms. Pain also in L scapula.   Medications patient has been prescribed:   Taking:         Reviewed prior external information including notes and imaging from previsou exam, outside providers and external EMR if available.   As well as notes that were available from care everywhere and other healthcare systems.  Past medical history, social, surgical and family history all reviewed in electronic medical record.  No pertanent information unless stated regarding to the chief complaint.   Past Medical History:  Diagnosis Date   Carotid artery occlusion    Chronic back pain    Chronic kidney disease    Fibromuscular dysplasia    Sleep apnea    Stroke (HCC)     Allergies[1]   Review of Systems:  No headache, visual changes, nausea, vomiting, diarrhea, constipation, dizziness, abdominal pain, skin rash, fevers, chills, night sweats, weight loss, swollen lymph nodes, body aches, joint swelling, chest pain, shortness of breath, mood changes. POSITIVE muscle aches  Objective  Blood pressure 104/78, pulse 83, height 5' (1.524 m), weight 126 lb (57.2 kg), last menstrual period 06/03/2014, SpO2 98%.   General: No apparent distress alert and oriented x3 mood and affect normal, dressed appropriately.  HEENT: Pupils equal, extraocular movements intact  Respiratory: Patient's speak in full sentences and does not appear short of breath   Cardiovascular: No lower extremity edema, non tender, no erythema  Gait relatively normal MSK:  Back significant tightness noted in the L4-L5 on the left side.  Positive FABER test noted.  Osteopathic findings  T3 extended rotated and side bent right inhaled rib T9 extended rotated and side bent left L2 flexed rotated and side bent right Sacrum right on right       Assessment and Plan:  Lumbar back pain Acute injury noted in the L4-L5 area.  Patient was doing a lot of different activities that I think caused an exacerbation.  Increase activity slowly.  Follow-up again in 6 to 8 weeks.    Nonallopathic problems  Decision today to treat with OMT was based on Physical Exam  After verbal consent patient was treated with HVLA, ME, FPR techniques in rib, thoracic, lumbar, and sacral  areas  Patient tolerated the procedure well with improvement in symptoms  Patient given exercises, stretches and lifestyle modifications  See medications in patient instructions if given  Patient will follow up in 4-8 weeks     The above documentation has been reviewed and is accurate and complete Rachel Helton M Verlie Liotta, DO         Note: This dictation was prepared with Dragon dictation along with smaller phrase technology. Any transcriptional errors that result from this process are unintentional.            [1]  Allergies Allergen Reactions   Albuterol Hives   "

## 2024-05-03 NOTE — Assessment & Plan Note (Signed)
 Acute injury noted in the L4-L5 area.  Patient was doing a lot of different activities that I think caused an exacerbation.  Increase activity slowly.  Follow-up again in 6 to 8 weeks.

## 2024-06-15 ENCOUNTER — Ambulatory Visit: Admitting: Family Medicine

## 2024-06-22 ENCOUNTER — Ambulatory Visit: Admitting: Family Medicine

## 2024-08-16 ENCOUNTER — Ambulatory Visit: Admitting: Neurology

## 2024-08-23 ENCOUNTER — Ambulatory Visit: Admitting: Neurology

## 2024-08-30 ENCOUNTER — Ambulatory Visit: Admitting: Neurology

## 2024-09-04 ENCOUNTER — Ambulatory Visit: Admitting: Neurology
